# Patient Record
Sex: Female | Born: 1990 | Race: Black or African American | Hispanic: No | Marital: Single | Smoking: Never smoker
Health system: Southern US, Community
[De-identification: ages and names within clinical notes are randomized; demographics above are authoritative.]

## PROBLEM LIST (undated history)

## (undated) ENCOUNTER — Inpatient Hospital Stay (HOSPITAL_COMMUNITY): Payer: Self-pay

## (undated) DIAGNOSIS — D649 Anemia, unspecified: Secondary | ICD-10-CM

## (undated) DIAGNOSIS — N946 Dysmenorrhea, unspecified: Secondary | ICD-10-CM

## (undated) DIAGNOSIS — Z22322 Carrier or suspected carrier of Methicillin resistant Staphylococcus aureus: Secondary | ICD-10-CM

## (undated) DIAGNOSIS — F32A Depression, unspecified: Secondary | ICD-10-CM

## (undated) DIAGNOSIS — A609 Anogenital herpesviral infection, unspecified: Secondary | ICD-10-CM

## (undated) DIAGNOSIS — G43009 Migraine without aura, not intractable, without status migrainosus: Secondary | ICD-10-CM

## (undated) DIAGNOSIS — F419 Anxiety disorder, unspecified: Secondary | ICD-10-CM

## (undated) DIAGNOSIS — A64 Unspecified sexually transmitted disease: Secondary | ICD-10-CM

## (undated) DIAGNOSIS — A630 Anogenital (venereal) warts: Secondary | ICD-10-CM

## (undated) HISTORY — PX: WISDOM TOOTH EXTRACTION: SHX21

## (undated) HISTORY — DX: Migraine without aura, not intractable, without status migrainosus: G43.009

## (undated) HISTORY — DX: Anogenital herpesviral infection, unspecified: A60.9

## (undated) HISTORY — DX: Anxiety disorder, unspecified: F41.9

## (undated) HISTORY — DX: Dysmenorrhea, unspecified: N94.6

## (undated) HISTORY — DX: Unspecified sexually transmitted disease: A64

## (undated) HISTORY — DX: Anemia, unspecified: D64.9

## (undated) HISTORY — DX: Depression, unspecified: F32.A

## (undated) HISTORY — DX: Anogenital (venereal) warts: A63.0

## (undated) HISTORY — DX: Carrier or suspected carrier of methicillin resistant Staphylococcus aureus: Z22.322

## (undated) HISTORY — PX: MOUTH SURGERY: SHX715

## (undated) HISTORY — PX: OTHER SURGICAL HISTORY: SHX169

---
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
## 2010-02-10 NOTE — L&D Delivery Note (Signed)
Delivery Note At 12:52 AM a viable and healthy female Alisha Edwards) was delivered via Vaginal, Spontaneous Delivery (Presentation:Vertex ; Occiput Anterior).  APGAR: 8, 9; weight 7 lb 1.6 oz (3221 g).   Placenta status: Intact, Spontaneous Pathology.  Cord: 3 vessels with the following complications: None.  Anesthesia: Epidural  Episiotomy: None Lacerations: 1st degree;Perineal Suture Repair: 2.0 vicryl Est. Blood Loss (mL): 350   Mom to postpartum.   Baby A to nursery-stable.   Baby B to NA.  Veleka Djordjevic V 01/25/2011, 1:14 AM

## 2010-04-03 ENCOUNTER — Ambulatory Visit: Payer: Self-pay | Admitting: Women's Health

## 2010-04-05 ENCOUNTER — Ambulatory Visit (HOSPITAL_COMMUNITY): Payer: Self-pay

## 2010-04-21 ENCOUNTER — Inpatient Hospital Stay (INDEPENDENT_AMBULATORY_CARE_PROVIDER_SITE_OTHER)
Admission: RE | Admit: 2010-04-21 | Discharge: 2010-04-21 | Disposition: A | Discharge status: Home / Self Care | Payer: 59 | Source: Ambulatory Visit | Attending: Emergency Medicine | Admitting: Emergency Medicine

## 2010-04-21 ENCOUNTER — Ambulatory Visit (INDEPENDENT_AMBULATORY_CARE_PROVIDER_SITE_OTHER): Payer: 59

## 2010-04-21 DIAGNOSIS — S62639A Displaced fracture of distal phalanx of unspecified finger, initial encounter for closed fracture: Secondary | ICD-10-CM

## 2010-06-18 ENCOUNTER — Inpatient Hospital Stay (INDEPENDENT_AMBULATORY_CARE_PROVIDER_SITE_OTHER)
Admission: RE | Admit: 2010-06-18 | Discharge: 2010-06-18 | Disposition: A | Discharge status: Home / Self Care | Payer: 59 | Source: Ambulatory Visit | Attending: Family Medicine | Admitting: Family Medicine

## 2010-06-18 DIAGNOSIS — Z331 Pregnant state, incidental: Secondary | ICD-10-CM

## 2010-06-18 LAB — POCT URINALYSIS DIP (DEVICE)
Bilirubin Urine: NEGATIVE
Glucose, UA: NEGATIVE mg/dL
Hgb urine dipstick: NEGATIVE
Ketones, ur: NEGATIVE mg/dL
Nitrite: NEGATIVE
Protein, ur: NEGATIVE mg/dL
Specific Gravity, Urine: 1.015 (ref 1.005–1.030)
Urobilinogen, UA: 0.2 mg/dL (ref 0.0–1.0)
pH: 7.5 (ref 5.0–8.0)

## 2010-06-18 LAB — POCT PREGNANCY, URINE: Preg Test, Ur: POSITIVE

## 2010-06-19 ENCOUNTER — Emergency Department: Payer: Self-pay | Admitting: Emergency Medicine

## 2010-10-17 ENCOUNTER — Encounter: Payer: Self-pay | Admitting: Gynecology

## 2010-10-17 ENCOUNTER — Encounter: Payer: Self-pay | Admitting: *Deleted

## 2010-10-17 ENCOUNTER — Ambulatory Visit (INDEPENDENT_AMBULATORY_CARE_PROVIDER_SITE_OTHER): Payer: 59 | Admitting: Gynecology

## 2010-10-17 DIAGNOSIS — Z22322 Carrier or suspected carrier of Methicillin resistant Staphylococcus aureus: Secondary | ICD-10-CM | POA: Insufficient documentation

## 2010-10-17 DIAGNOSIS — N898 Other specified noninflammatory disorders of vagina: Secondary | ICD-10-CM

## 2010-10-17 DIAGNOSIS — A609 Anogenital herpesviral infection, unspecified: Secondary | ICD-10-CM | POA: Insufficient documentation

## 2010-10-17 DIAGNOSIS — B009 Herpesviral infection, unspecified: Secondary | ICD-10-CM | POA: Insufficient documentation

## 2010-10-17 NOTE — Progress Notes (Signed)
Patient presented complaining of a vaginal discharge and on questioning from the office staff was found to be 6 months pregnant and actively being followed by [facility]. She was asked to call their office and followup with them to be seen and she agreed to do so.

## 2011-01-24 ENCOUNTER — Encounter (HOSPITAL_COMMUNITY): Payer: Self-pay | Admitting: Anesthesiology

## 2011-01-24 ENCOUNTER — Encounter (HOSPITAL_COMMUNITY): Payer: Self-pay | Admitting: *Deleted

## 2011-01-24 ENCOUNTER — Inpatient Hospital Stay (HOSPITAL_COMMUNITY): Payer: 59 | Admitting: Anesthesiology

## 2011-01-24 ENCOUNTER — Inpatient Hospital Stay (HOSPITAL_COMMUNITY)
Admission: AD | Admit: 2011-01-24 | Discharge: 2011-01-27 | DRG: 775 | Disposition: A | Discharge status: Home / Self Care | Payer: 59 | Source: Ambulatory Visit | Attending: Obstetrics and Gynecology | Admitting: Obstetrics and Gynecology

## 2011-01-24 LAB — DIFFERENTIAL
Basophils Relative: 0 % (ref 0–1)
Eosinophils Absolute: 0 10*3/uL (ref 0.0–0.7)
Lymphs Abs: 1.3 10*3/uL (ref 0.7–4.0)
Monocytes Relative: 12 % (ref 3–12)
Neutro Abs: 8.5 10*3/uL — ABNORMAL HIGH (ref 1.7–7.7)
Neutrophils Relative %: 76 % (ref 43–77)

## 2011-01-24 LAB — RPR: RPR: NONREACTIVE

## 2011-01-24 LAB — CBC
Hemoglobin: 12.3 g/dL (ref 12.0–15.0)
MCH: 30.8 pg (ref 26.0–34.0)
Platelets: 175 10*3/uL (ref 150–400)
RBC: 4 MIL/uL (ref 3.87–5.11)
WBC: 11.1 10*3/uL — ABNORMAL HIGH (ref 4.0–10.5)

## 2011-01-24 LAB — ABO/RH: RH Type: POSITIVE

## 2011-01-24 LAB — RUBELLA ANTIBODY, IGM: Rubella: IMMUNE

## 2011-01-24 LAB — HEPATITIS B SURFACE ANTIGEN: Hepatitis B Surface Ag: NEGATIVE

## 2011-01-24 MED ORDER — LACTATED RINGERS IV SOLN
INTRAVENOUS | Status: DC
  Administered 2011-01-24 (×2): via INTRAVENOUS

## 2011-01-24 MED ORDER — FENTANYL 2.5 MCG/ML BUPIVACAINE 1/10 % EPIDURAL INFUSION (WH - ANES)
14.0000 mL/h | INTRAMUSCULAR | Status: DC
  Administered 2011-01-24 (×2): 14 mL/h via EPIDURAL
  Filled 2011-01-24 (×2): qty 60

## 2011-01-24 MED ORDER — BUTORPHANOL TARTRATE 2 MG/ML IJ SOLN: 1.0000 mg | INTRAMUSCULAR | Status: DC | PRN

## 2011-01-24 MED ORDER — LIDOCAINE HCL 1.5 % IJ SOLN
INTRAMUSCULAR | Status: DC | PRN
  Administered 2011-01-24 (×2): 5 mL via EPIDURAL

## 2011-01-24 MED ORDER — OXYTOCIN BOLUS FROM INFUSION
500.0000 mL | Freq: Once | INTRAVENOUS | Status: DC
  Filled 2011-01-24: qty 1000
  Filled 2011-01-24: qty 500

## 2011-01-24 MED ORDER — FENTANYL CITRATE 0.05 MG/ML IJ SOLN: 100.0000 ug | INTRAMUSCULAR | Status: DC | PRN

## 2011-01-24 MED ORDER — LACTATED RINGERS IV SOLN: 500.0000 mL | INTRAVENOUS | Status: DC | PRN

## 2011-01-24 MED ORDER — DIPHENHYDRAMINE HCL 50 MG/ML IJ SOLN: 12.5000 mg | INTRAMUSCULAR | Status: DC | PRN

## 2011-01-24 MED ORDER — PHENYLEPHRINE 40 MCG/ML (10ML) SYRINGE FOR IV PUSH (FOR BLOOD PRESSURE SUPPORT)
80.0000 ug | PREFILLED_SYRINGE | INTRAVENOUS | Status: DC | PRN
  Filled 2011-01-24: qty 5

## 2011-01-24 MED ORDER — LACTATED RINGERS IV BOLUS (SEPSIS)
1000.0000 mL | Freq: Once | INTRAVENOUS | Status: AC
  Administered 2011-01-24: 1000 mL via INTRAVENOUS

## 2011-01-24 MED ORDER — PHENYLEPHRINE 40 MCG/ML (10ML) SYRINGE FOR IV PUSH (FOR BLOOD PRESSURE SUPPORT): 80.0000 ug | PREFILLED_SYRINGE | INTRAVENOUS | Status: DC | PRN

## 2011-01-24 MED ORDER — IBUPROFEN 600 MG PO TABS
600.0000 mg | ORAL_TABLET | Freq: Four times a day (QID) | ORAL | Status: DC | PRN
  Administered 2011-01-25: 600 mg via ORAL
  Filled 2011-01-24: qty 1

## 2011-01-24 MED ORDER — LACTATED RINGERS IV SOLN: INTRAVENOUS | Status: DC

## 2011-01-24 MED ORDER — OXYCODONE-ACETAMINOPHEN 5-325 MG PO TABS: 2.0000 | ORAL_TABLET | ORAL | Status: DC | PRN

## 2011-01-24 MED ORDER — ACETAMINOPHEN 325 MG PO TABS: 650.0000 mg | ORAL_TABLET | ORAL | Status: DC | PRN

## 2011-01-24 MED ORDER — CITRIC ACID-SODIUM CITRATE 334-500 MG/5ML PO SOLN: 30.0000 mL | ORAL | Status: DC | PRN

## 2011-01-24 MED ORDER — LIDOCAINE HCL (PF) 1 % IJ SOLN
30.0000 mL | INTRAMUSCULAR | Status: DC | PRN
  Filled 2011-01-24: qty 30

## 2011-01-24 MED ORDER — ONDANSETRON HCL 4 MG/2ML IJ SOLN: 4.0000 mg | Freq: Four times a day (QID) | INTRAMUSCULAR | Status: DC | PRN

## 2011-01-24 MED ORDER — LACTATED RINGERS IV SOLN: 500.0000 mL | Freq: Once | INTRAVENOUS | Status: DC

## 2011-01-24 MED ORDER — FLEET ENEMA 7-19 GM/118ML RE ENEM: 1.0000 | ENEMA | RECTAL | Status: DC | PRN

## 2011-01-24 MED ORDER — EPHEDRINE 5 MG/ML INJ
10.0000 mg | INTRAVENOUS | Status: DC | PRN
  Filled 2011-01-24: qty 4

## 2011-01-24 MED ORDER — OXYTOCIN 20 UNITS IN LACTATED RINGERS INFUSION - SIMPLE
125.0000 mL/h | Freq: Once | INTRAVENOUS | Status: AC
  Administered 2011-01-25: 999 mL/h via INTRAVENOUS

## 2011-01-24 MED ORDER — EPHEDRINE 5 MG/ML INJ: 10.0000 mg | INTRAVENOUS | Status: DC | PRN

## 2011-01-24 NOTE — Anesthesia Preprocedure Evaluation (Signed)

## 2011-01-24 NOTE — H&P (Signed)
Alisha Edwards is a 20 y.o. female, G1P0 at 86 4/7 weeks, presenting in labor--cervix was 3 cm, 90%, vtx 0station at office, now 4-5, 100% after brief evaluation in MAU.  Denies leaking or bleeding, reports +FM.  Pregnancy remarkable for: Hx HSV--confidential, no recent or current lesions Hx MRSA 2003 1st trimester bleeding Migraines Slightly late to care at 17 weeks, but had some care in [facility] with Dr. Chilton Si  History of present pregnancy: Entered care at 17 weeks.  Had seen Dr. Chilton Si in [facility] for some care.  Had anatomy US at 20 weeks, with normal findings.  Declined flu vaccine.  No other issues.  No recent or current HSV outbreaks.  History OB History    Grav Para Term Preterm Abortions TAB SAB Ect Mult Living   1              Past Medical History  Diagnosis Date  . MRSA (methicillin resistant staph aureus) culture positive 2009    thigh  . HSV (herpes simplex virus) anogenital infection    Past Surgical History  Procedure Date  . Nasal surgery   . Mouth surgery    Family History: family history includes Breast cancer in her maternal grandmother and paternal grandmother and Hypertension in her maternal grandmother. Social History:  reports that she has never smoked. She does not have any smokeless tobacco history on file. She reports that she does not drink alcohol or use illicit drugs.  ROS:  Contractions, vaginal spotting.  Denies any other symptoms.    Blood pressure 116/70, pulse 89, temperature 99.5 F (37.5 C), temperature source Oral, resp. rate 20, height 5' 5.75" (1.67 m), weight 69.627 kg (153 lb 8 oz), last menstrual period 04/30/2010. Maternal Exam:  Uterine Assessment: Contraction strength is moderate.  Contraction frequency is regular.   Abdomen: Patient reports no abdominal tenderness. Fundal height is 37-38.   Estimated fetal weight is 7 obs.   Fetal presentation: vertex  Introitus: Normal vulva. Vulval lesions: no HSV lesions or prodromal sx.  Normal vagina.    Physical Exam  Genitourinary: Vulval lesions: no HSV lesions or prodromal sx.  Chest clear Heart RRR without murmur Abd gravid, NT, UCs q 3-4 minutes, moderate Cervix per RN--4+, 90 %. Vtx, 0 station, BBOW. Ext WNL, no edema FHR non-reactive, no decels, baseline 160.  Prenatal labs: ABO, Rh:  A+ Antibody:  Neg Rubella:  Immune RPR:   NR HBsAg:   Neg HIV:   Neg GBS:   Neg Sickle cell negative Cultures negative at NOB and at 36 weeks Glucola WNL Hgb at NOB 11.8/12 at 28 weeks  Assessment/Plan: IUP at 37 4/7 weeks Active labor GBS negative Hx HSV, no recent or current lesions Hx MRSA in past, on precautions Non-reactive FHR, with baseline 160  Plan: Admit to Birthing Suite per consult with Dr. Stefano Gaul IV hydration Plans epidural Close observation of FHR status. Anticipate  Alisha Edwards 01/24/2011, 6:20 PM

## 2011-01-24 NOTE — Progress Notes (Signed)
Patient C/O contraction pain. BP 126/76  Pulse 98  Temp(Src) 98.7 F (37.1 C) (Axillary)  Resp 20  Ht 5' 5.75" (1.67 m)  Wt 69.627 kg (153 lb 8 oz)  BMI 24.96 kg/m2  SpO2 80%  LMP 04/30/2010 NST: Accels noted. Variability 2-4 beats per minute. Will get epidural. Watch FHT.

## 2011-01-24 NOTE — Progress Notes (Signed)
Pt states, " I started contracting at 12:30 pm and they are five minutes apart and stronger. I was 3/80 in the office at 3:15 pm."

## 2011-01-24 NOTE — Anesthesia Procedure Notes (Signed)
Epidural Patient location during procedure: OB Start time: 01/24/2011 7:20 PM  Staffing Performed by: anesthesiologist   Preanesthetic Checklist Completed: patient identified, site marked, surgical consent, pre-op evaluation, timeout performed, IV checked, risks and benefits discussed and monitors and equipment checked  Epidural Patient position: sitting Prep: site prepped and draped and DuraPrep Patient monitoring: continuous pulse ox and blood pressure Approach: midline Injection technique: LOR air and LOR saline  Needle:  Needle type: Tuohy  Needle gauge: 17 G Needle length: 9 cm Needle insertion depth: 4 cm Catheter type: closed end flexible Catheter size: 19 Gauge Catheter at skin depth: 9 cm Test dose: negative  Assessment Events: blood not aspirated, injection not painful, no injection resistance, negative IV test and no paresthesia  Additional Notes Patient identified.  Risk benefits discussed including failed block, incomplete pain control, headache, nerve damage, paralysis, blood pressure changes, nausea, vomiting, reactions to medication both toxic or allergic, and postpartum back pain.  Patient expressed understanding and wished to proceed.  All questions were answered.  Sterile technique used throughout procedure and epidural site dressed with sterile barrier dressing. No paresthesia or other complications noted.The patient did not experience any signs of intravascular injection such as tinnitus or metallic taste in mouth nor signs of intrathecal spread such as rapid motor block. Please see nursing notes for vital signs.

## 2011-01-24 NOTE — Progress Notes (Signed)
Was 3cm in office, ctx's stronger, more regular now, denies bleeding or lof.

## 2011-01-24 NOTE — Progress Notes (Signed)
Patient ID: SUSY PLACZEK, female   DOB: 28-Apr-1990, 20 y.o.   MRN: [id number]   .Subjective: Comfortable with epidural   Objective: BP 126/77  Pulse 91  Temp(Src) 99.6 F (37.6 C) (Oral)  Resp 18  Ht 5' 5.75" (1.67 m)  Wt 69.627 kg (153 lb 8 oz)  BMI 24.96 kg/m2  SpO2 95%  LMP 04/30/2010   FHT:  FHR: 150 bpm, variability: minimal ,  accelerations:  Abscent,  decelerations:  Absent UC:   regular, every 2-3 minutes SVE:   Dilation: 8.5 Effacement (%): 100 Station: 0 Exam by:: s. Henri Guedes, CNM    Assessment / Plan: Spontaneous labor, progressing normally AROM clear fluid FSE placed, minimal response to scalp stim IUPC placed  Will do amnioinfusion   Fetal Wellbeing:  Category II Pain Control:  Epidural  D/W Dr Stefano Gaul Will CTO closely  Malissa Hippo 01/24/2011, 9:44 PM

## 2011-01-25 ENCOUNTER — Other Ambulatory Visit: Payer: Self-pay | Admitting: Obstetrics and Gynecology

## 2011-01-25 LAB — RPR: RPR Ser Ql: NONREACTIVE

## 2011-01-25 LAB — CBC
HCT: 29.2 % — ABNORMAL LOW (ref 36.0–46.0)
Hemoglobin: 10 g/dL — ABNORMAL LOW (ref 12.0–15.0)
MCH: 31.1 pg (ref 26.0–34.0)
MCHC: 34.2 g/dL (ref 30.0–36.0)
RBC: 3.22 MIL/uL — ABNORMAL LOW (ref 3.87–5.11)

## 2011-01-25 MED ORDER — BENZOCAINE-MENTHOL 20-0.5 % EX AERO
INHALATION_SPRAY | CUTANEOUS | Status: AC
  Administered 2011-01-25: 1 via TOPICAL
  Filled 2011-01-25: qty 56

## 2011-01-25 MED ORDER — LANOLIN HYDROUS EX OINT: TOPICAL_OINTMENT | CUTANEOUS | Status: DC | PRN

## 2011-01-25 MED ORDER — SENNOSIDES-DOCUSATE SODIUM 8.6-50 MG PO TABS
2.0000 | ORAL_TABLET | Freq: Every day | ORAL | Status: DC
  Administered 2011-01-25: 2 via ORAL

## 2011-01-25 MED ORDER — DIPHENHYDRAMINE HCL 25 MG PO CAPS: 25.0000 mg | ORAL_CAPSULE | Freq: Four times a day (QID) | ORAL | Status: DC | PRN

## 2011-01-25 MED ORDER — TETANUS-DIPHTH-ACELL PERTUSSIS 5-2.5-18.5 LF-MCG/0.5 IM SUSP: 0.5000 mL | Freq: Once | INTRAMUSCULAR | Status: DC

## 2011-01-25 MED ORDER — MEDROXYPROGESTERONE ACETATE 150 MG/ML IM SUSP: 150.0000 mg | INTRAMUSCULAR | Status: DC | PRN

## 2011-01-25 MED ORDER — ZOLPIDEM TARTRATE 5 MG PO TABS: 5.0000 mg | ORAL_TABLET | Freq: Every evening | ORAL | Status: DC | PRN

## 2011-01-25 MED ORDER — WITCH HAZEL-GLYCERIN EX PADS: 1.0000 "application " | MEDICATED_PAD | CUTANEOUS | Status: DC | PRN

## 2011-01-25 MED ORDER — IBUPROFEN 600 MG PO TABS
600.0000 mg | ORAL_TABLET | Freq: Four times a day (QID) | ORAL | Status: DC
  Administered 2011-01-25 – 2011-01-27 (×9): 600 mg via ORAL
  Filled 2011-01-25 (×9): qty 1

## 2011-01-25 MED ORDER — BENZOCAINE-MENTHOL 20-0.5 % EX AERO
1.0000 "application " | INHALATION_SPRAY | CUTANEOUS | Status: DC | PRN
  Administered 2011-01-25: 1 via TOPICAL

## 2011-01-25 MED ORDER — DIBUCAINE 1 % RE OINT: 1.0000 "application " | TOPICAL_OINTMENT | RECTAL | Status: DC | PRN

## 2011-01-25 MED ORDER — MEASLES, MUMPS & RUBELLA VAC ~~LOC~~ INJ
0.5000 mL | INJECTION | Freq: Once | SUBCUTANEOUS | Status: DC
  Filled 2011-01-25: qty 0.5

## 2011-01-25 MED ORDER — SIMETHICONE 80 MG PO CHEW: 80.0000 mg | CHEWABLE_TABLET | ORAL | Status: DC | PRN

## 2011-01-25 MED ORDER — ONDANSETRON HCL 4 MG PO TABS: 4.0000 mg | ORAL_TABLET | ORAL | Status: DC | PRN

## 2011-01-25 MED ORDER — OXYCODONE-ACETAMINOPHEN 5-325 MG PO TABS
1.0000 | ORAL_TABLET | ORAL | Status: DC | PRN
  Administered 2011-01-27 (×2): 1 via ORAL
  Filled 2011-01-25 (×2): qty 1

## 2011-01-25 MED ORDER — ONDANSETRON HCL 4 MG/2ML IJ SOLN: 4.0000 mg | INTRAMUSCULAR | Status: DC | PRN

## 2011-01-25 MED ORDER — PRENATAL PLUS 27-1 MG PO TABS
1.0000 | ORAL_TABLET | Freq: Every day | ORAL | Status: DC
  Administered 2011-01-25 – 2011-01-27 (×3): 1 via ORAL
  Filled 2011-01-25 (×3): qty 1

## 2011-01-25 NOTE — Anesthesia Postprocedure Evaluation (Signed)
Anesthesia Post Note  Patient: Alisha Edwards  Procedure(s) Performed: * No procedures listed *  Anesthesia type: Epidural  Patient location: Mother/Baby  Post pain: Pain level controlled  Post assessment: Post-op Vital signs reviewed  Last Vitals:  Filed Vitals:   01/25/11 0822  BP: 103/59  Pulse: 86  Temp: 36.8 C  Resp: 18    Post vital signs: Reviewed  Level of consciousness: awake  Complications: No apparent anesthesia complications

## 2011-01-26 NOTE — Progress Notes (Signed)
Patient ID: Alisha Edwards, female   DOB: 1990/09/29, 20 y.o.   MRN: [id number] Post Partum Day 1 Subjective: no complaints, up ad lib without syncope, voiding, tolerating PO, + flatus  Pain well controlled with po meds Bottle feeding Mood stable, bonding well   Objective: Blood pressure 95/58, pulse 81, temperature 98.1 F (36.7 C), temperature source Oral, resp. rate 18, height 5' 5.75" (1.67 m), weight 69.627 kg (153 lb 8 oz), last menstrual period 04/30/2010, SpO2 97.00%.  Physical Exam:  General: alert and no distress Lungs: CTAB Heart: RRR Breasts: soft Lochia: appropriate Uterine Fundus: firm Perineum: wnl DVT Evaluation: No evidence of DVT seen on physical exam. Negative Homan's sign. No significant calf/ankle edema.   Basename 01/25/11 0520 01/24/11 1733  HGB 10.0* 12.3  HCT 29.2* 37.1    Assessment/Plan: Plan for discharge tomorrow and Contraception discussed options including r/b/s, desires LoEstrin Stable PP Continue current POC      LOS: 2 days   Kedarius Aloisi M 01/26/2011, 11:04 AM

## 2011-01-27 ENCOUNTER — Encounter (HOSPITAL_COMMUNITY): Payer: Self-pay | Admitting: Obstetrics and Gynecology

## 2011-01-27 MED ORDER — BENZOCAINE-MENTHOL 20-0.5 % EX AERO
INHALATION_SPRAY | CUTANEOUS | Status: AC
  Administered 2011-01-27: 14:00:00
  Filled 2011-01-27: qty 56

## 2011-01-27 MED ORDER — NORGESTIMATE-ETH ESTRADIOL 0.25-35 MG-MCG PO TABS: 1.0000 | ORAL_TABLET | Freq: Every day | ORAL | Status: DC

## 2011-01-27 MED ORDER — IBUPROFEN 600 MG PO TABS: 600.0000 mg | ORAL_TABLET | Freq: Four times a day (QID) | ORAL | Status: AC

## 2011-01-27 MED ORDER — OXYCODONE-ACETAMINOPHEN 5-325 MG PO TABS: 1.0000 | ORAL_TABLET | ORAL | Status: AC | PRN

## 2011-01-27 NOTE — Discharge Summary (Signed)
Obstetric Discharge Summary Reason for Admission: onset of labor Prenatal Procedures: ultrasound Intrapartum Procedures: spontaneous vaginal delivery Postpartum Procedures: none Complications-Operative and Postpartum: 1st degree perineal laceration Hemoglobin  Date Value Range Status  01/25/2011 10.0* 12.0-15.0 (g/dL) Final     DELTA CHECK NOTED     REPEATED TO VERIFY     HCT  Date Value Range Status  01/25/2011 29.2* 36.0-46.0 (%) Final   Hospital Course: Admitted 01/24/11. Postive GBS. Had amnioinfusion for some decelerations, epidural placed for comfort. Delivery was performed by Dr. Stefano Gaul without complication. Patient and baby tolerated the procedure without difficulty, with  1st degree laceration noted. Infant to FTN. Mother and infant then had an uncomplicated postpartum course, with bottle feeding going well. Mom's physical exam was WNL, and she was discharged home in stable condition. Contraception plan was OCPs, rx'd with Sprintec.  She received adequate benefit from po pain medications, was using both Motrin and Percocet.   Discharge Diagnoses: Term Pregnancy-delivered  Discharge Information: Date: 01/27/2011 Activity: Per [facility] handout Diet: routine Medications: Ibuprofen and Percocet Condition: stable Instructions: refer to practice specific booklet Discharge to: home Follow-up Information    Follow up with [facility].         Newborn Data: Live born female  Birth Weight: 7 lb 1.6 oz (3221 g) APGAR: 8, 9  Home with mother.  Alisha Edwards 01/27/2011, 8:03 AM

## 2011-06-12 ENCOUNTER — Other Ambulatory Visit (HOSPITAL_COMMUNITY)
Admission: RE | Admit: 2011-06-12 | Discharge: 2011-06-12 | Disposition: A | Discharge status: Home / Self Care | Payer: 59 | Source: Ambulatory Visit | Attending: Gynecology | Admitting: Gynecology

## 2011-06-12 ENCOUNTER — Ambulatory Visit (INDEPENDENT_AMBULATORY_CARE_PROVIDER_SITE_OTHER): Payer: 59 | Admitting: Gynecology

## 2011-06-12 ENCOUNTER — Encounter: Payer: Self-pay | Admitting: Gynecology

## 2011-06-12 VITALS — BP 100/60 | Ht 66.5 in | Wt 132.0 lb

## 2011-06-12 DIAGNOSIS — Z01419 Encounter for gynecological examination (general) (routine) without abnormal findings: Secondary | ICD-10-CM

## 2011-06-12 DIAGNOSIS — N898 Other specified noninflammatory disorders of vagina: Secondary | ICD-10-CM

## 2011-06-12 LAB — WET PREP FOR TRICH, YEAST, CLUE
Clue Cells Wet Prep HPF POC: NONE SEEN
Yeast Wet Prep HPF POC: NONE SEEN

## 2011-06-12 MED ORDER — METRONIDAZOLE 500 MG PO TABS: 500.0000 mg | ORAL_TABLET | Freq: Two times a day (BID) | ORAL | Status: AC

## 2011-06-12 NOTE — Progress Notes (Signed)
Addended by: Richardson Chiquito on: 06/12/2011 11:40 AM   Modules accepted: Orders

## 2011-06-12 NOTE — Progress Notes (Signed)
Addended by: Richardson Chiquito on: 06/12/2011 11:26 AM   Modules accepted: Orders

## 2011-06-12 NOTE — Patient Instructions (Signed)
Start on oral contraceptives as we discussed. Follow up in one year for annual exam.

## 2011-06-12 NOTE — Progress Notes (Signed)
Alisha Edwards 1990/07/06 [id number]        21 y.o.  for annual exam.  Just delivered vaginally in December. Had a normal postpartum checkup.  Is bottlefeeding.  Several issues noted below.  Past medical history,surgical history, medications, allergies, family history and social history were all reviewed and documented in the EPIC chart. ROS:  Was performed and pertinent positives and negatives are included in the history.  Exam: Sherrilyn Rist chaperone present Filed Vitals:   06/12/11 1047  BP: 100/60   General appearance  Normal Skin grossly normal Head/Neck normal with no cervical or supraclavicular adenopathy thyroid normal Lungs  clear Cardiac RR, without RMG Abdominal  soft, nontender, without masses, organomegaly or hernia Breasts  examined lying and sitting without masses, retractions, discharge or axillary adenopathy. Pelvic  Ext/BUS/vagina  normal white discharge  Cervix  normal  Pap done  Uterus  anteverted, normal size, shape and contour, midline and mobile nontender   Adnexa  Without masses or tenderness    Anus and perineum  normal   Rectovaginal  normal sphincter tone without palpated masses or tenderness.    Assessment/Plan:  21 y.o. female for annual exam.    1. White discharge. Patient to use Monistat said that seemed a little better but still is present. Wet prep consistent with PVD. We'll treat with Flagyl 500 twice a day x7 days alcohol avoidance reviewed. 2. Contraception. Patient's had several menses since delivery which were heavy. She has a prescription for oral contraceptives from her postpartum visit and plan starting with her next menses. Assuming her menses lighten them we'll follow up as an issue she knows to represent for further evaluation. 3. Pap smear. I did a Pap smear today as she is turning 21 in 2 months. 4. Self breast exams on the basis discussed encouraged. She is bottlefeeding. 5. Health maintenance. No lab work was done today given her recent  delivery. She'll see me in a year assuming she continues well, sooner as needed.    Dara Lords MD, 11:19 AM 06/12/2011

## 2011-07-02 ENCOUNTER — Encounter (HOSPITAL_COMMUNITY): Payer: Self-pay | Admitting: *Deleted

## 2011-07-02 ENCOUNTER — Emergency Department (HOSPITAL_COMMUNITY)
Admission: EM | Admit: 2011-07-02 | Discharge: 2011-07-02 | Disposition: A | Discharge status: Home / Self Care | Payer: 59 | Source: Home / Self Care | Attending: Emergency Medicine | Admitting: Emergency Medicine

## 2011-07-02 DIAGNOSIS — J329 Chronic sinusitis, unspecified: Secondary | ICD-10-CM

## 2011-07-02 DIAGNOSIS — N760 Acute vaginitis: Secondary | ICD-10-CM

## 2011-07-02 LAB — POCT URINALYSIS DIP (DEVICE)
Nitrite: NEGATIVE
Protein, ur: NEGATIVE mg/dL
Urobilinogen, UA: 1 mg/dL (ref 0.0–1.0)
pH: 7.5 (ref 5.0–8.0)

## 2011-07-02 LAB — WET PREP, GENITAL: Clue Cells Wet Prep HPF POC: NONE SEEN

## 2011-07-02 MED ORDER — AMOXICILLIN 500 MG PO CAPS: 500.0000 mg | ORAL_CAPSULE | Freq: Three times a day (TID) | ORAL | Status: AC

## 2011-07-02 MED ORDER — METRONIDAZOLE 500 MG PO TABS: 500.0000 mg | ORAL_TABLET | Freq: Two times a day (BID) | ORAL | Status: AC

## 2011-07-02 NOTE — ED Notes (Signed)
Pt with c/o cough/congestion x 2 weeks pt also with vaginal irritation soreness onset yesterday - pt with history bacterial infection completed antibiotics

## 2011-07-02 NOTE — ED Provider Notes (Signed)
Medical screening examination/treatment/procedure(s) were performed by non-physician practitioner and as supervising physician I was immediately available for consultation/collaboration.  Cylan Borum   Sanela Evola, MD 07/02/11 1251 

## 2011-07-02 NOTE — Discharge Instructions (Signed)
Bacterial Vaginosis Bacterial vaginosis (BV) is a vaginal infection where the normal balance of bacteria in the vagina is disrupted. The normal balance is then replaced by an overgrowth of certain bacteria. There are several different kinds of bacteria that can cause BV. BV is the most common vaginal infection in women of childbearing age. CAUSES   The cause of BV is not fully understood. BV develops when there is an increase or imbalance of harmful bacteria.   Some activities or behaviors can upset the normal balance of bacteria in the vagina and put women at increased risk including:   Having a new sex partner or multiple sex partners.   Douching.   Using an intrauterine device (IUD) for contraception.   It is not clear what role sexual activity plays in the development of BV. However, women that have never had sexual intercourse are rarely infected with BV.  Women do not get BV from toilet seats, bedding, swimming pools or from touching objects around them.  SYMPTOMS   Grey vaginal discharge.   A fish-like odor with discharge, especially after sexual intercourse.   Itching or burning of the vagina and vulva.   Burning or pain with urination.   Some women have no signs or symptoms at all.  DIAGNOSIS  Your caregiver must examine the vagina for signs of BV. Your caregiver will perform lab tests and look at the sample of vaginal fluid through a microscope. They will look for bacteria and abnormal cells (clue cells), a pH test higher than 4.5, and a positive amine test all associated with BV.  RISKS AND COMPLICATIONS   Pelvic inflammatory disease (PID).   Infections following gynecology surgery.   Developing HIV.   Developing herpes virus.  TREATMENT  Sometimes BV will clear up without treatment. However, all women with symptoms of BV should be treated to avoid complications, especially if gynecology surgery is planned. Female partners generally do not need to be treated. However,  BV may spread between female sex partners so treatment is helpful in preventing a recurrence of BV.   BV may be treated with antibiotics. The antibiotics come in either pill or vaginal cream forms. Either can be used with nonpregnant or pregnant women, but the recommended dosages differ. These antibiotics are not harmful to the baby.   BV can recur after treatment. If this happens, a second round of antibiotics will often be prescribed.   Treatment is important for pregnant women. If not treated, BV can cause a premature delivery, especially for a pregnant woman who had a premature birth in the past. All pregnant women who have symptoms of BV should be checked and treated.   For chronic reoccurrence of BV, treatment with a type of prescribed gel vaginally twice a week is helpful.  HOME CARE INSTRUCTIONS   Finish all medication as directed by your caregiver.   Do not have sex until treatment is completed.   Tell your sexual partner that you have a vaginal infection. They should see their caregiver and be treated if they have problems, such as a mild rash or itching.   Practice safe sex. Use condoms. Only have 1 sex partner.  PREVENTION  Basic prevention steps can help reduce the risk of upsetting the natural balance of bacteria in the vagina and developing BV:  Do not have sexual intercourse (be abstinent).   Do not douche.   Use all of the medicine prescribed for treatment of BV, even if the signs and symptoms go away.     Tell your sex partner if you have BV. That way, they can be treated, if needed, to prevent reoccurrence.  SEEK MEDICAL CARE IF:   Your symptoms are not improving after 3 days of treatment.   You have increased discharge, pain, or fever.  MAKE SURE YOU:   Understand these instructions.   Will watch your condition.   Will get help right away if you are not doing well or get worse.  FOR MORE INFORMATION  Division of STD Prevention (DSTDP), Centers for Disease  Control and Prevention: SolutionApps.co.za American Social Health Association (ASHA): www.ashastd.org  Document Released: 01/27/2005 Document Revised: 01/16/2011 Document Reviewed: 07/20/2008 [facility] Patient Information 2012 [place], [place].Sinusitis Sinuses are air pockets within the bones of your face. The growth of bacteria within a sinus leads to infection. The infection prevents the sinuses from draining. This infection is called sinusitis. SYMPTOMS  There will be different areas of pain depending on which sinuses have become infected.  The maxillary sinuses often produce pain beneath the eyes.   Frontal sinusitis may cause pain in the middle of the forehead and above the eyes.  Other problems (symptoms) include:  Toothaches.   Colored, pus-like (purulent) drainage from the nose.   Swelling, warmth, and tenderness over the sinus areas may be signs of infection.  TREATMENT  Sinusitis is most often determined by an exam.X-rays may be taken. If x-rays have been taken, make sure you obtain your results or find out how you are to obtain them. Your caregiver may give you medications (antibiotics). These are medications that will help kill the bacteria causing the infection. You may also be given a medication (decongestant) that helps to reduce sinus swelling.  HOME CARE INSTRUCTIONS   Only take over-the-counter or prescription medicines for pain, discomfort, or fever as directed by your caregiver.   Drink extra fluids. Fluids help thin the mucus so your sinuses can drain more easily.   Applying either moist heat or ice packs to the sinus areas may help relieve discomfort.   Use saline nasal sprays to help moisten your sinuses. The sprays can be found at your local drugstore.  SEEK IMMEDIATE MEDICAL CARE IF:  You have a fever.   You have increasing pain, severe headaches, or toothache.   You have nausea, vomiting, or drowsiness.   You develop unusual swelling around the face or  trouble seeing.  MAKE SURE YOU:   Understand these instructions.   Will watch your condition.   Will get help right away if you are not doing well or get worse.  Document Released: 01/27/2005 Document Revised: 01/16/2011 Document Reviewed: 08/26/2006 [facility] Patient Information 2012 [place], [place].

## 2011-07-02 NOTE — ED Provider Notes (Signed)
History     CSN: [id number]  Arrival date & time 07/02/11  1030   First MD Initiated Contact with Patient 07/02/11 1132      Chief Complaint  Patient presents with  . Cough  . Nasal Congestion  . Vaginal Itching    (Consider location/radiation/quality/duration/timing/severity/associated sxs/prior treatment) Patient is a 21 y.o. female presenting with cough, vaginal itching, and vaginal discharge. The history is provided by the patient.  Cough This is a new problem. The current episode started more than 1 week ago. The problem occurs constantly. The problem has been gradually worsening. The cough is non-productive. There has been no fever. Associated symptoms include rhinorrhea and shortness of breath. She has tried nothing for the symptoms. The treatment provided moderate relief. Her past medical history does not include bronchitis or pneumonia.  Vaginal Itching Associated symptoms include shortness of breath.  Vaginal Discharge This is a recurrent problem. The current episode started more than 1 week ago. The problem occurs constantly. Associated symptoms include shortness of breath. The symptoms are aggravated by nothing. The symptoms are relieved by nothing.  Pt was recently treated for BV.  Pt reports she has the same symptoms again  Past Medical History  Diagnosis Date  . MRSA (methicillin resistant staph aureus) culture positive 2009    thigh  . HSV (herpes simplex virus) anogenital infection   . Bacterial vaginosis     Past Surgical History  Procedure Date  . Nasal surgery   . Mouth surgery     Family History  Problem Relation Age of Onset  . Hypertension Maternal Grandmother   . Breast cancer Maternal Grandmother   . Breast cancer Paternal Grandmother     History  Substance Use Topics  . Smoking status: Never Smoker   . Smokeless tobacco: Never Used  . Alcohol Use: Yes     rarely    OB History    Grav Para Term Preterm Abortions TAB SAB Ect Mult Living     2 1 1       1       Review of Systems  HENT: Positive for rhinorrhea.   Respiratory: Positive for cough and shortness of breath.   Genitourinary: Positive for vaginal discharge.  All other systems reviewed and are negative.    Allergies  Shellfish allergy  Home Medications   Current Outpatient Rx  Name Route Sig Dispense Refill  . THERAFLU FLU/COLD PO Oral Take by mouth.    . GUAIFENESIN 100 MG/5ML PO LIQD Oral Take 200 mg by mouth 3 (three) times daily as needed.    . AMOXICILLIN 500 MG PO CAPS Oral Take 1 capsule (500 mg total) by mouth 3 (three) times daily. 30 capsule 0  . METRONIDAZOLE 500 MG PO TABS Oral Take 1 tablet (500 mg total) by mouth 2 (two) times daily. 14 tablet 0  . NORGESTIMATE-ETH ESTRADIOL 0.25-35 MG-MCG PO TABS Oral Take 1 tablet by mouth daily. 1 Package 11    BP 101/63  Pulse 80  Temp(Src) 99 F (37.2 C) (Oral)  Resp 14  SpO2 100%  LMP 05/31/2011  Physical Exam  Nursing note and vitals reviewed. Constitutional: She is oriented to person, place, and time. She appears well-developed and well-nourished.  HENT:  Head: Normocephalic and atraumatic.  Neck: Normal range of motion.  Cardiovascular: Normal rate and normal heart sounds.   Pulmonary/Chest: Effort normal.  Abdominal: Soft. Bowel sounds are normal.  Genitourinary: Vaginal discharge found.       Cervix nontender  Musculoskeletal: Normal range of motion.  Neurological: She is alert and oriented to person, place, and time. She has normal reflexes.    ED Course  Procedures (including critical care time)  Labs Reviewed  POCT URINALYSIS DIP (DEVICE) - Abnormal; Notable for the following:    Leukocytes, UA TRACE (*) Biochemical Testing Only. Please order routine urinalysis from main lab if confirmatory testing is needed.   All other components within normal limits  POCT PREGNANCY, URINE  WET PREP, GENITAL  GC/CHLAMYDIA PROBE AMP, GENITAL   No results found.   1. Sinusitis   2.  Vaginitis       MDM          Elson Areas, PA 07/02/11 1239

## 2011-07-03 LAB — GC/CHLAMYDIA PROBE AMP, GENITAL: Chlamydia, DNA Probe: NEGATIVE

## 2011-07-04 ENCOUNTER — Telehealth (HOSPITAL_COMMUNITY): Payer: Self-pay | Admitting: *Deleted

## 2011-07-04 NOTE — ED Notes (Signed)
GC/Chlamydia neg., Wet prep: Rare yeast, many WBC's.  Pt. complained of vaginal discharge and itching.  Lab shown to Dr. Ladon Applebaum and order obtained for Diflucan 150 mg. po X 1. No refills.  I called and left a message for pt. to call. Vassie Moselle 07/04/2011

## 2011-07-04 NOTE — ED Notes (Signed)
Pt. called back on VM. I called pt. back.  Pt. verified x 2 and given results. I told her she did not have to finish the Flagyl unless she wanted to, but she needs 1 pill of Diflucan. Pt. got angry and said so I have to get another Rx? I told her yes, the lab did not come back until today and I got the order for it today.  She said to send it to [facility] on [address].  Rx. called to pharmacist @ [phone]. Vassie Moselle 07/04/2011

## 2011-07-05 MED ORDER — FLUCONAZOLE 150 MG PO TABS: 200.0000 mg | ORAL_TABLET | Freq: Once | ORAL | Status: AC

## 2011-11-03 ENCOUNTER — Encounter: Payer: Self-pay | Admitting: Gynecology

## 2011-11-03 ENCOUNTER — Ambulatory Visit (INDEPENDENT_AMBULATORY_CARE_PROVIDER_SITE_OTHER): Payer: 59 | Admitting: Gynecology

## 2011-11-03 DIAGNOSIS — N949 Unspecified condition associated with female genital organs and menstrual cycle: Secondary | ICD-10-CM

## 2011-11-03 DIAGNOSIS — R102 Pelvic and perineal pain: Secondary | ICD-10-CM

## 2011-11-03 DIAGNOSIS — N938 Other specified abnormal uterine and vaginal bleeding: Secondary | ICD-10-CM

## 2011-11-03 NOTE — Patient Instructions (Signed)
Intrauterine Device Insertion Most often, an intrauterine device (IUD) is inserted into the uterus to prevent pregnancy. There are 2 types of IUDs available:  Copper IUD. This type of IUD creates an environment that is not favorable to sperm survival. The mechanism of action of the copper IUD is not known for certain. It can stay in place for 10 years.   Hormone IUD. This type of IUD contains the hormone progestin (synthetic progesterone). The progestin thickens the cervical mucus and prevents sperm from entering the uterus, and it also thins the uterine lining. There is no evidence that the hormone IUD prevents implantation. The hormone IUD can stay in place for up to 5 years.  An IUD is the most cost-effective birth control if left in place for the full duration. It may be removed at any time. LET YOUR CAREGIVER KNOW ABOUT:  Sensitivity to metals.   Medicines taken including herbs, eyedrops, over-the-counter medicines, and creams.   Use of steroids (by mouth or creams).   Previous problems with anesthetics or numbing medicine.   Previous gynecological surgery.   History of blood clots or clotting disorders.   Possibility of pregnancy.   Menstrual irregularities.   Concerns regarding unusual vaginal discharge or odors.   Previous experience with an IUD.   Other health problems.  RISKS AND COMPLICATIONS  Accidental puncture (perforation) of the uterus.   Accidental placement of the IUD either in the muscle layer of the uterus (myometrium) or outside the uterus. If this happen, the IUD can be found essentially floating around the bowels. When this happens, the IUD must be taken out surgically.   The IUD may fall out of the uterus (expulsion). This is more common in women who have recently had a child.    Pregnancy in the fallopian tube (ectopic).  BEFORE THE PROCEDURE  Schedule the IUD insertion for when you will have your menstrual period or right after, to make sure you  are not pregnant. Placement of the IUD is better tolerated shortly after a menstrual cycle.   You may need to take tests or be examined to make sure you are not pregnant.   You may be required to take a pregnancy test.   You may be required to get checked for sexually transmitted infections (STIs) prior to placement. Placing an IUD in someone who has an infection can make an infection worse.   You may be given a pain reliever to take 1 or 2 hours before the procedure.   An exam will be performed to determine the size and position of your uterus.   Ask your caregiver about changing or stopping your regular medicines.  PROCEDURE   A tool (speculum) is placed in the vagina. This allows your caregiver to see the lower part of the uterus (cervix).   The cervix is prepped with a medicine that lowers the risk of infection.   You may be given a medicine to numb each side of the cervix (intracervical or paracervical block). This is used to block and control any discomfort with insertion.   A tool (uterine sound) is inserted into the uterus to determine the length of the uterine cavity and the direction the uterus may be tilted.   A slim instrument (IUD inserter) is inserted through the cervical canal and into your uterus.   The IUD is placed in the uterine cavity and the insertion device is removed.   The nylon string that is attached to the IUD, and used for   eventual IUD removal, is trimmed. It is trimmed so that it lays high in the vagina, just outside the cervix.  AFTER THE PROCEDURE  You may have bleeding after the procedure. This is normal. It varies from light spotting for a few days to menstrual-like bleeding.   You may have mild cramping.   Practice checking the string coming out of the cervix to make sure the IUD remains in the uterus. If you cannot feel the string, you should schedule a "string check" with your caregiver.   If you had a hormone IUD inserted, expect that your  period may be lighter or nonexistent within a year's time (though this is not always the case). There may be delayed fertility with the hormone IUD as a result of its progesterone effect. When you are ready to become pregnant, it is suggested to have the IUD removed up to 1 year in advance.   Yearly exams are advised.  Document Released: 09/25/2010 Document Revised: 01/16/2011 Document Reviewed: 09/25/2010 Exit[employer] Patient Information 2012 [employer]. 

## 2011-11-03 NOTE — Progress Notes (Signed)
Patient presents with normal monthly menses LMP 10/18/2011. Started spotting 2 days ago with cramping. Checked a home pregnancy test which was negative. No history of this before. No fever chills nausea vomiting diarrhea constipation or urinary symptoms.  Exam with Kim Asst. Abdomen soft nontender without masses guarding rebound organomegaly. Pelvic external BUS vagina with dark brown staining. Cervix normal. Uterus anteverted normal size midline mobile nontender. Adnexa without masses or tenderness.  Assessment and plan: 1.  Episode of breakthrough bleeding with cramping. Exam is normal. No history of irregular bleeding or cramping previously.  Check serum hCG and GC Chlamydia screen of the cervix. Motrin 800 mg 3 times a day for now. Possible ultrasound if cramping persists or becomes recurrent. 2. Contraceptive options. I reviewed contraceptive options with her. She is using condoms inconsistently. Pill, patch, ring, IUD, Nexplanon reviewed. Patient is interested in Mirena IUD.  I gave her a booklet on this. I reviewed the insertional process and the risks/benefits. If patient desires we'll schedule with her next menses. Again if pain or irregular bleeding continues she'll call and we'll start a further evaluation with ultrasound.

## 2011-11-04 ENCOUNTER — Telehealth: Payer: Self-pay | Admitting: Gynecology

## 2011-11-04 LAB — GC/CHLAMYDIA PROBE AMP, GENITAL
Chlamydia, DNA Probe: NEGATIVE
GC Probe Amp, Genital: NEGATIVE

## 2011-11-04 LAB — HCG, SERUM, QUALITATIVE: Preg, Serum: NEGATIVE

## 2011-11-04 NOTE — Telephone Encounter (Signed)
MIRENA BENEFITS: Spoke w/pt today and told her that her [facility] insurance covers the Mirena & insertion at 100%,no copay. She will call us the  First day of her next cycle to schedule an appt with TF/WL

## 2011-11-06 ENCOUNTER — Other Ambulatory Visit: Payer: Self-pay | Admitting: Gynecology

## 2011-11-06 DIAGNOSIS — Z3049 Encounter for surveillance of other contraceptives: Secondary | ICD-10-CM

## 2011-11-06 MED ORDER — LEVONORGESTREL 20 MCG/24HR IU IUD: INTRAUTERINE_SYSTEM | Freq: Once | INTRAUTERINE | Status: DC

## 2011-11-24 ENCOUNTER — Other Ambulatory Visit: Payer: Self-pay | Admitting: Gynecology

## 2011-11-24 DIAGNOSIS — Z3049 Encounter for surveillance of other contraceptives: Secondary | ICD-10-CM

## 2011-11-24 MED ORDER — LEVONORGESTREL 20 MCG/24HR IU IUD: INTRAUTERINE_SYSTEM | Freq: Once | INTRAUTERINE | Status: DC

## 2011-11-26 ENCOUNTER — Ambulatory Visit: Payer: 59 | Admitting: Gynecology

## 2011-11-28 ENCOUNTER — Encounter: Payer: Self-pay | Admitting: Gynecology

## 2011-11-28 ENCOUNTER — Ambulatory Visit (INDEPENDENT_AMBULATORY_CARE_PROVIDER_SITE_OTHER): Payer: 59 | Admitting: Gynecology

## 2011-11-28 ENCOUNTER — Other Ambulatory Visit: Payer: Self-pay | Admitting: Gynecology

## 2011-11-28 DIAGNOSIS — Z30431 Encounter for routine checking of intrauterine contraceptive device: Secondary | ICD-10-CM

## 2011-11-28 DIAGNOSIS — Z30430 Encounter for insertion of intrauterine contraceptive device: Secondary | ICD-10-CM

## 2011-11-28 MED ORDER — LEVONORGESTREL 20 MCG/24HR IU IUD: INTRAUTERINE_SYSTEM | Freq: Once | INTRAUTERINE | Status: DC

## 2011-11-28 NOTE — Patient Instructions (Signed)
Intrauterine Device Insertion Most often, an intrauterine device (IUD) is inserted into the uterus to prevent pregnancy. There are 2 types of IUDs available:  Copper IUD. This type of IUD creates an environment that is not favorable to sperm survival. The mechanism of action of the copper IUD is not known for certain. It can stay in place for 10 years.  Hormone IUD. This type of IUD contains the hormone progestin (synthetic progesterone). The progestin thickens the cervical mucus and prevents sperm from entering the uterus, and it also thins the uterine lining. There is no evidence that the hormone IUD prevents implantation. The hormone IUD can stay in place for up to 5 years. An IUD is the most cost-effective birth control if left in place for the full duration. It may be removed at any time. LET YOUR CAREGIVER KNOW ABOUT:  Sensitivity to metals.  Medicines taken including herbs, eyedrops, over-the-counter medicines, and creams.  Use of steroids (by mouth or creams).  Previous problems with anesthetics or numbing medicine.  Previous gynecological surgery.  History of blood clots or clotting disorders.  Possibility of pregnancy.  Menstrual irregularities.  Concerns regarding unusual vaginal discharge or odors.  Previous experience with an IUD.  Other health problems. RISKS AND COMPLICATIONS  Accidental puncture (perforation) of the uterus.  Accidental placement of the IUD either in the muscle layer of the uterus (myometrium) or outside the uterus. If this happen, the IUD can be found essentially floating around the bowels. When this happens, the IUD must be taken out surgically.  The IUD may fall out of the uterus (expulsion). This is more common in women who have recently had a child.   Pregnancy in the fallopian tube (ectopic). BEFORE THE PROCEDURE  Schedule the IUD insertion for when you will have your menstrual period or right after, to make sure you are not pregnant.  Placement of the IUD is better tolerated shortly after a menstrual cycle.  You may need to take tests or be examined to make sure you are not pregnant.  You may be required to take a pregnancy test.  You may be required to get checked for sexually transmitted infections (STIs) prior to placement. Placing an IUD in someone who has an infection can make an infection worse.  You may be given a pain reliever to take 1 or 2 hours before the procedure.  An exam will be performed to determine the size and position of your uterus.  Ask your caregiver about changing or stopping your regular medicines. PROCEDURE   A tool (speculum) is placed in the vagina. This allows your caregiver to see the lower part of the uterus (cervix).  The cervix is prepped with a medicine that lowers the risk of infection.  You may be given a medicine to numb each side of the cervix (intracervical or paracervical block). This is used to block and control any discomfort with insertion.  A tool (uterine sound) is inserted into the uterus to determine the length of the uterine cavity and the direction the uterus may be tilted.  A slim instrument (IUD inserter) is inserted through the cervical canal and into your uterus.  The IUD is placed in the uterine cavity and the insertion device is removed.  The nylon string that is attached to the IUD, and used for eventual IUD removal, is trimmed. It is trimmed so that it lays high in the vagina, just outside the cervix. AFTER THE PROCEDURE  You may have bleeding after the  attached to the IUD, and used for eventual IUD removal, is trimmed. It is trimmed so that it lays high in the vagina, just outside the cervix.  AFTER THE PROCEDURE  · You may have bleeding after the procedure. This is normal. It varies from light spotting for a few days to menstrual-like bleeding.   · You may have mild cramping.  · Practice checking the string coming out of the cervix to make sure the IUD remains in the uterus. If you cannot feel the string, you should schedule a "string check" with your caregiver.  · If you had a hormone IUD inserted, expect that your period may be lighter or nonexistent  within a year's time (though this is not always the case). There may be delayed fertility with the hormone IUD as a result of its progesterone effect. When you are ready to become pregnant, it is suggested to have the IUD removed up to 1 year in advance.  · Yearly exams are advised.  Document Released: 09/25/2010 Document Revised: 04/21/2011 Document Reviewed: 09/25/2010  Exit[employer]® Patient Information ©2013 [employer].

## 2011-11-28 NOTE — Progress Notes (Signed)
Patient presents for Mirena IUD placement. She has read through the booklet, has no contraindications and signed the consent form. She currently is on a normal menses.  I reviewed the insertional process with her as well as the risks to include infection either immediate or long-term, uterine perforation or migration requiring surgery to remove, other complications such as pain, hormonal side effects, infertility  and possibilities of failure with subsequent pregnancy.    Exam with Kim assistant Pelvic: External BUS vagina normal. Cervix normal light menses. Uterus anteverted normal size shape contour midline mobile nontender. Adnexa without masses or tenderness.  Procedure: The cervix was cleansed with Betadine, anterior lip grasped with a single-tooth tenaculum, the uterus was sounded and a Mirena IUD was placed according to manufacturer's recommendations without difficulty. The strings were trimmed. The patient tolerated well and will follow up in one month for a postinsertional check.  Lot number:[id number]

## 2011-12-26 ENCOUNTER — Ambulatory Visit: Payer: 59 | Admitting: Gynecology

## 2012-03-24 ENCOUNTER — Ambulatory Visit: Payer: 59 | Admitting: Gynecology

## 2012-03-27 ENCOUNTER — Other Ambulatory Visit: Payer: Self-pay

## 2012-04-01 ENCOUNTER — Encounter: Payer: Self-pay | Admitting: Gynecology

## 2012-04-01 ENCOUNTER — Ambulatory Visit (INDEPENDENT_AMBULATORY_CARE_PROVIDER_SITE_OTHER): Payer: 59 | Admitting: Gynecology

## 2012-04-01 DIAGNOSIS — Z30432 Encounter for removal of intrauterine contraceptive device: Secondary | ICD-10-CM

## 2012-04-01 DIAGNOSIS — N899 Noninflammatory disorder of vagina, unspecified: Secondary | ICD-10-CM

## 2012-04-01 DIAGNOSIS — Z30431 Encounter for routine checking of intrauterine contraceptive device: Secondary | ICD-10-CM

## 2012-04-01 DIAGNOSIS — N938 Other specified abnormal uterine and vaginal bleeding: Secondary | ICD-10-CM

## 2012-04-01 DIAGNOSIS — N949 Unspecified condition associated with female genital organs and menstrual cycle: Secondary | ICD-10-CM

## 2012-04-01 DIAGNOSIS — N898 Other specified noninflammatory disorders of vagina: Secondary | ICD-10-CM

## 2012-04-01 LAB — WET PREP FOR TRICH, YEAST, CLUE: Yeast Wet Prep HPF POC: NONE SEEN

## 2012-04-01 LAB — TSH: TSH: 1.187 u[IU]/mL (ref 0.350–4.500)

## 2012-04-01 MED ORDER — NORETHINDRONE ACET-ETHINYL EST 1-20 MG-MCG PO TABS: 1.0000 | ORAL_TABLET | Freq: Every day | ORAL | Status: DC

## 2012-04-01 MED ORDER — METRONIDAZOLE 500 MG PO TABS: 500.0000 mg | ORAL_TABLET | Freq: Two times a day (BID) | ORAL | Status: DC

## 2012-04-01 NOTE — Patient Instructions (Signed)
Start on oral contraceptives now. Follow up in May for your annual exam. Take Flagyl medication twice daily for 7 days. Avoid alcohol while taking.

## 2012-04-01 NOTE — Progress Notes (Signed)
Patient presents having had a Mirena IUD placed in October.  Since then there is a lot of dry skin irritation and breakthrough bleeding particularly since December. She is tired of this and wants to have her IUD removed. Once to go ahead and start on oral contraceptives. I reviewed alternatives with her to include a trial of progesterone only to see if we can't in her endometrium before removing it to see if this doesn't go away with the bleeding but she's already convinced that she wants the IUD removed.    Exam with Kim assistant Abdomen soft nontender without masses guarding rebound organomegaly. Pelvic external BUS vagina with thick white discharge. Cervix normal IUD string visualized. Uterus normal size midline mobile nontender. Adnexa without masses or tenderness.  Assessment and plan: 1. White discharge. KOH wet prep positive for bacterial vaginosis. We'll treat with Flagyl 500 mg twice daily for 7 days, alcohol avoidance reviewed. 2. IUD management. I again reviewed options to try to save the IUD but she wants it removed and subsequently the IUD string was grasped with a [place] forceps and removed, shown to the patient and discarded. We'll have him start on Loestrin 120 equivalent. She is due for her annual in May given her several refills and she'll represent for her annual exam at that time. We'll start her pills today.  Plan check baseline TSH prolactin given the dysfunctional bleeding pattern for completeness. If her irregular bleeding which continues then consider ultrasound evaluation.

## 2012-06-14 ENCOUNTER — Encounter: Payer: 59 | Admitting: Gynecology

## 2012-07-09 ENCOUNTER — Encounter (HOSPITAL_COMMUNITY): Payer: Self-pay | Admitting: Emergency Medicine

## 2012-07-09 DIAGNOSIS — Z8614 Personal history of Methicillin resistant Staphylococcus aureus infection: Secondary | ICD-10-CM | POA: Insufficient documentation

## 2012-07-09 DIAGNOSIS — J029 Acute pharyngitis, unspecified: Secondary | ICD-10-CM | POA: Insufficient documentation

## 2012-07-09 DIAGNOSIS — R51 Headache: Secondary | ICD-10-CM | POA: Insufficient documentation

## 2012-07-09 DIAGNOSIS — H538 Other visual disturbances: Secondary | ICD-10-CM | POA: Insufficient documentation

## 2012-07-09 DIAGNOSIS — Z9889 Other specified postprocedural states: Secondary | ICD-10-CM | POA: Insufficient documentation

## 2012-07-09 DIAGNOSIS — Z8619 Personal history of other infectious and parasitic diseases: Secondary | ICD-10-CM | POA: Insufficient documentation

## 2012-07-09 DIAGNOSIS — Z3202 Encounter for pregnancy test, result negative: Secondary | ICD-10-CM | POA: Insufficient documentation

## 2012-07-09 DIAGNOSIS — R42 Dizziness and giddiness: Secondary | ICD-10-CM | POA: Insufficient documentation

## 2012-07-09 NOTE — ED Notes (Signed)
C/o sore throat x 4 days.  Reports headache that started today and vomited x 2 tonight.

## 2012-07-10 ENCOUNTER — Emergency Department (HOSPITAL_COMMUNITY)
Admission: EM | Admit: 2012-07-10 | Discharge: 2012-07-10 | Disposition: A | Discharge status: Home / Self Care | Payer: BC Managed Care – PPO | Attending: Emergency Medicine | Admitting: Emergency Medicine

## 2012-07-10 DIAGNOSIS — R519 Headache, unspecified: Secondary | ICD-10-CM

## 2012-07-10 DIAGNOSIS — J029 Acute pharyngitis, unspecified: Secondary | ICD-10-CM

## 2012-07-10 LAB — POCT I-STAT, CHEM 8
Creatinine, Ser: 0.9 mg/dL (ref 0.50–1.10)
Hemoglobin: 12.9 g/dL (ref 12.0–15.0)
Sodium: 140 mEq/L (ref 135–145)
TCO2: 27 mmol/L (ref 0–100)

## 2012-07-10 MED ORDER — AMOXICILLIN 500 MG PO CAPS: 500.0000 mg | ORAL_CAPSULE | Freq: Three times a day (TID) | ORAL | Status: DC

## 2012-07-10 MED ORDER — AMOXICILLIN 500 MG PO CAPS
500.0000 mg | ORAL_CAPSULE | Freq: Once | ORAL | Status: AC
  Administered 2012-07-10: 500 mg via ORAL
  Filled 2012-07-10: qty 1

## 2012-07-10 MED ORDER — HYDROCODONE-ACETAMINOPHEN 7.5-325 MG/15ML PO SOLN
30.0000 mL | Freq: Four times a day (QID) | ORAL | Status: DC | PRN
Start: 2012-07-10 — End: 2012-10-02

## 2012-07-10 MED ORDER — DEXAMETHASONE SODIUM PHOSPHATE 10 MG/ML IJ SOLN
10.0000 mg | Freq: Once | INTRAMUSCULAR | Status: AC
  Administered 2012-07-10: 10 mg via INTRAVENOUS
  Filled 2012-07-10: qty 1

## 2012-07-10 MED ORDER — DIPHENHYDRAMINE HCL 50 MG/ML IJ SOLN
25.0000 mg | Freq: Once | INTRAMUSCULAR | Status: AC
  Administered 2012-07-10: 25 mg via INTRAVENOUS
  Filled 2012-07-10: qty 1

## 2012-07-10 MED ORDER — ONDANSETRON HCL 4 MG/2ML IJ SOLN
4.0000 mg | Freq: Once | INTRAMUSCULAR | Status: AC
  Administered 2012-07-10: 4 mg via INTRAVENOUS
  Filled 2012-07-10: qty 2

## 2012-07-10 MED ORDER — SODIUM CHLORIDE 0.9 % IV BOLUS (SEPSIS)
1000.0000 mL | Freq: Once | INTRAVENOUS | Status: AC
  Administered 2012-07-10: 1000 mL via INTRAVENOUS

## 2012-07-10 MED ORDER — KETOROLAC TROMETHAMINE 30 MG/ML IJ SOLN
30.0000 mg | Freq: Once | INTRAMUSCULAR | Status: AC
  Administered 2012-07-10: 30 mg via INTRAVENOUS
  Filled 2012-07-10: qty 1

## 2012-07-10 NOTE — ED Provider Notes (Signed)
History     CSN: [id number]  Arrival date & time 07/09/12  2316   First MD Initiated Contact with Patient 07/10/12 [phone]      Chief Complaint  Patient presents with  . Headache  . Sore Throat    (Consider location/radiation/quality/duration/timing/severity/associated sxs/prior treatment) HPI  Patient to the ER with complaints of sore throat for 4 days and onset of headache this evening. Her throat feels like ground glass but she is not having difficulty breathing or swallowing- no change in voice. She developed a headache this evening that is all over but worse in the frontal lobe and feels like pressure or a squeezing. She also describes having some blurry vision. NO weakness, neck pain, fevers, confusion, diarrhea, abdominal pains, dysuria, vaginal discharge or bleeding. Denies significant past medical history. Normally healthy. Does typically get migraines but this feels different.   Past Medical History  Diagnosis Date  . MRSA (methicillin resistant staph aureus) culture positive 2009    thigh  . HSV (herpes simplex virus) anogenital infection     Past Surgical History  Procedure Laterality Date  . Nasal surgery    . Mouth surgery    . Intrauterine device insertion  11/28/2011    Mirena    Family History  Problem Relation Age of Onset  . Hypertension Maternal Grandmother   . Breast cancer Maternal Grandmother   . Breast cancer Paternal Grandmother     History  Substance Use Topics  . Smoking status: Never Smoker   . Smokeless tobacco: Never Used  . Alcohol Use: Yes     Comment: rarely    OB History   Grav Para Term Preterm Abortions TAB SAB Ect Mult Living   1 1 1       1       Review of Systems  HENT: Positive for sore throat.   Neurological: Positive for dizziness and headaches.  All other systems reviewed and are negative.    Allergies  Shellfish allergy  Home Medications   Current Outpatient Rx  Name  Route  Sig  Dispense  Refill  .  amoxicillin (AMOXIL) 500 MG capsule   Oral   Take 1 capsule (500 mg total) by mouth 3 (three) times daily.   21 capsule   0   . HYDROcodone-acetaminophen (HYCET) 7.5-325 mg/15 ml solution   Oral   Take 30 mLs by mouth 4 (four) times daily as needed for pain.   120 mL   0     BP 106/66  Pulse 74  Temp(Src) 98.2 F (36.8 C) (Oral)  Resp 16  SpO2 99%  LMP 07/02/2012  Physical Exam  Nursing note and vitals reviewed. Constitutional: She is oriented to person, place, and time. She appears well-developed and well-nourished. No distress.  HENT:  Head: Normocephalic and atraumatic.  Mouth/Throat: Uvula is midline, oropharynx is clear and moist and mucous membranes are normal. No edematous. No oropharyngeal exudate, posterior oropharyngeal edema, posterior oropharyngeal erythema or tonsillar abscesses.  Eyes: Pupils are equal, round, and reactive to light.  Neck: Normal range of motion. Neck supple. No spinous process tenderness and no muscular tenderness present. No rigidity. Normal range of motion present. No Brudzinski's sign and no Kernig's sign noted.  Cardiovascular: Normal rate and regular rhythm.   Pulmonary/Chest: Effort normal.  Abdominal: Soft.  Neurological: She is alert and oriented to person, place, and time. She has normal strength. No cranial nerve deficit or sensory deficit.  Skin: Skin is warm and dry.  ED Course  Procedures (including critical care time)  Labs Reviewed  RAPID STREP SCREEN  CULTURE, GROUP A STREP  POCT I-STAT, CHEM 8  POCT PREGNANCY, URINE   No results found.   1. Headache   2. Sore throat       MDM  Negative strep screen, I-stat 8 WNL.  NO physical exam abnormalities. Patient started on 1L normal saline, benadryl, zofran, decadron and Morphine. Nurse informed me that she started to dry heave right after administration of medication. Will monitor.  5:05am- patients headache has completely resolved. She admits that she feels a  lot better but still has mild sore throat. Will give abx and pain medication. She is to follow-up with her pcp.  Filed Vitals:   07/10/12 0500  BP: 115/74  Pulse: 79  Temp:   Resp:      Pt has been advised of the symptoms that warrant their return to the ED. Patient has voiced understanding and has agreed to follow-up with the PCP or specialist.       Dorthula Matas, PA-C 07/10/12 [phone]

## 2012-07-10 NOTE — ED Notes (Signed)
Pt resting comfortably, states feels batter at this time.

## 2012-07-10 NOTE — ED Provider Notes (Signed)
Medical screening examination/treatment/procedure(s) were performed by non-physician practitioner and as supervising physician I was immediately available for consultation/collaboration.   Julie Manly, MD 07/10/12 0834 

## 2012-07-10 NOTE — ED Notes (Signed)
Pt comfortable with d/c and f/u instructions. Prescriptions x2. 

## 2012-07-10 NOTE — ED Notes (Addendum)
Immediately after administering decadron and zofran, pt became very nauseous and began dry heaving. EDPA made aware

## 2012-07-12 LAB — CULTURE, GROUP A STREP

## 2012-09-22 ENCOUNTER — Ambulatory Visit: Payer: Self-pay | Admitting: Gynecology

## 2012-10-02 ENCOUNTER — Encounter (HOSPITAL_COMMUNITY): Payer: Self-pay | Admitting: Emergency Medicine

## 2012-10-02 ENCOUNTER — Emergency Department (HOSPITAL_COMMUNITY)
Admission: EM | Admit: 2012-10-02 | Discharge: 2012-10-02 | Disposition: A | Discharge status: Home / Self Care | Payer: BC Managed Care – PPO | Attending: Emergency Medicine | Admitting: Emergency Medicine

## 2012-10-02 DIAGNOSIS — Z8614 Personal history of Methicillin resistant Staphylococcus aureus infection: Secondary | ICD-10-CM | POA: Insufficient documentation

## 2012-10-02 DIAGNOSIS — H9203 Otalgia, bilateral: Secondary | ICD-10-CM

## 2012-10-02 DIAGNOSIS — J029 Acute pharyngitis, unspecified: Secondary | ICD-10-CM | POA: Insufficient documentation

## 2012-10-02 DIAGNOSIS — R509 Fever, unspecified: Secondary | ICD-10-CM | POA: Insufficient documentation

## 2012-10-02 DIAGNOSIS — R599 Enlarged lymph nodes, unspecified: Secondary | ICD-10-CM | POA: Insufficient documentation

## 2012-10-02 DIAGNOSIS — R5381 Other malaise: Secondary | ICD-10-CM | POA: Insufficient documentation

## 2012-10-02 DIAGNOSIS — H9209 Otalgia, unspecified ear: Secondary | ICD-10-CM | POA: Insufficient documentation

## 2012-10-02 LAB — RAPID STREP SCREEN (MED CTR MEBANE ONLY): Streptococcus, Group A Screen (Direct): NEGATIVE

## 2012-10-02 MED ORDER — HYDROCODONE-ACETAMINOPHEN 7.5-325 MG/15ML PO SOLN: 15.0000 mL | Freq: Three times a day (TID) | ORAL | Status: DC | PRN

## 2012-10-02 NOTE — ED Provider Notes (Signed)
CSN: [id number]     Arrival date & time 10/02/12  1601 History   This chart was scribed for Antony Madura, non-physician practitioner working with Juliet Rude. Rubin Payor, MD by Leone Payor, ED Scribe. This patient was seen in room TR11C/TR11C and the patient's care was started at 1601.   First MD Initiated Contact with Patient 10/02/12 1636     Chief Complaint  Patient presents with  . Sore Throat  . Otalgia   The history is provided by the patient. No language interpreter was used.   HPI Comments: Alisha Edwards is a 22 y.o. female who presents to the Emergency Department complaining of constant, gradually worsening sore throat that began three days ago with associated bilateral otalgia that began 2 days ago. Pt describes the pain as sharp.  Pt is experiencing associated fatigue and pain with swallowing, and has had a fever (Tmax 99) and mouth dryness.  Pain is worsened with tongue movement and opening her mouth. Pt has taken Benadryl with minimal relief and has tried salt water gargles on the first day but has not tried any other at home treatments.  Pt has had some ongoing dental pain but she believes it is unrelated to her sore throat.  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  Pt denies ear discharge, bleeding or lesions in the mouth, eye redness, drooling, shortness of breath.      Past Medical History  Diagnosis Date  . MRSA (methicillin resistant staph aureus) culture positive 2009    thigh  . HSV (herpes simplex virus) anogenital infection    Past Surgical History  Procedure Laterality Date  . Nasal surgery    . Mouth surgery    . Intrauterine device insertion  11/28/2011    Mirena   Family History  Problem Relation Age of Onset  . Hypertension Maternal Grandmother   . Breast cancer Maternal Grandmother   . Breast cancer Paternal Grandmother    History  Substance Use Topics  . Smoking status: Never Smoker   . Smokeless tobacco: Never Used  . Alcohol  Use: Yes     Comment: rarely   OB History   Grav Para Term Preterm Abortions TAB SAB Ect Mult Living   1 1 1       1      Review of Systems  Constitutional: Positive for fever and fatigue.       Low grade fever of around 99.  HENT: Positive for ear pain and sore throat. Negative for drooling, mouth sores and ear discharge.   Eyes: Negative for redness.  Respiratory: Negative for shortness of breath.   All other systems reviewed and are negative.   Allergies  Shellfish allergy  Home Medications   Current Outpatient Rx  Name  Route  Sig  Dispense  Refill  . HYDROcodone-acetaminophen (HYCET) 7.5-325 mg/15 ml solution   Oral   Take 15 mLs by mouth every 8 (eight) hours as needed for pain.   120 mL   0    BP 107/60  Pulse 79  Temp(Src) 98.5 F (36.9 C) (Oral)  Resp 16  SpO2 100% Physical Exam  Nursing note and vitals reviewed. Constitutional: She is oriented to person, place, and time. She appears well-developed and well-nourished. No distress.  HENT:  Head: Normocephalic and atraumatic.  Right Ear: Tympanic membrane, external ear and ear canal normal. No mastoid tenderness.  Left Ear: Tympanic membrane, external ear and ear canal normal. No mastoid tenderness.  Mouth/Throat: Uvula is midline and mucous membranes are normal. No trismus in the jaw. No edematous. Posterior oropharyngeal erythema present. No oropharyngeal exudate, posterior oropharyngeal edema or tonsillar abscesses.  External ears, canals, and TMs normal . No mastoid tenderness. Erythema of posterior pharynx with enlargement of left tonsil.  No tonsilar exudates. Uvula midline.  Eyes: Conjunctivae and EOM are normal. No scleral icterus.  Neck: Normal range of motion.  Cardiovascular: Normal rate, regular rhythm and normal heart sounds.   Pulmonary/Chest: Effort normal. No stridor. No respiratory distress.  Musculoskeletal: Normal range of motion.  Lymphadenopathy:    She has cervical adenopathy.   Neurological: She is alert and oriented to person, place, and time.  Skin: Skin is warm and dry. No rash noted. She is not diaphoretic. No erythema. No pallor.  Psychiatric: She has a normal mood and affect. Her behavior is normal.    ED Course  DIAGNOSTIC STUDIES: Oxygen Saturation is 100% on RA, normal by my interpretation.    COORDINATION OF CARE: 5:00 PM Discussed treatment plan with pt at bedside and pt agreed to plan.   Procedures (including critical care time)  Labs Reviewed  RAPID STREP SCREEN  CULTURE, GROUP A STREP   No results found.  1. Viral pharyngitis   2. Otalgia of both ears    MDM  Patient presents for sore throat and bilateral otalgia x3 days. Patient is well and nontoxic appearing and afebrile. Physical exam findings as above. Airway patent and patient tolerating secretions without difficulty. Uvula midline without evidence of peritonsillar abscess. Bilateral ears, canals, and TMs normal; there is no mastoid tenderness. There is no stridor. Rapid strep screen negative. Symptoms most consistent with viral pharyngitis. Patient appropriate for discharge with primary care followup as needed. Supportive treatment with saltwater gargles, ibuprofen, Chloraseptic spray, and adequate fluid hydration recommended. Hycet prescribed for sore throat as needed. Indications for ED return provided and patient agreeable to plan.  I personally performed the services described in this documentation, which was scribed in my presence. The recorded information has been reviewed and is accurate.    Antony Madura, PA-C 10/02/12 1725

## 2012-10-02 NOTE — ED Notes (Signed)
Onset 3 days ago sore throat and bilateral ear pain.  Pain 10/10 achy soreness.  Airway intact bilateral equal chest rise and fall.

## 2012-10-02 NOTE — ED Provider Notes (Signed)
Medical screening examination/treatment/procedure(s) were performed by non-physician practitioner and as supervising physician I was immediately available for consultation/collaboration.  Jerita Wimbush R. Glenola Wheat, MD 10/02/12 2241 

## 2012-10-04 LAB — CULTURE, GROUP A STREP

## 2012-11-07 ENCOUNTER — Encounter (HOSPITAL_BASED_OUTPATIENT_CLINIC_OR_DEPARTMENT_OTHER): Payer: Self-pay | Admitting: Emergency Medicine

## 2012-11-07 ENCOUNTER — Emergency Department (HOSPITAL_BASED_OUTPATIENT_CLINIC_OR_DEPARTMENT_OTHER)
Admission: EM | Admit: 2012-11-07 | Discharge: 2012-11-07 | Disposition: A | Discharge status: Home / Self Care | Payer: BC Managed Care – PPO | Attending: Emergency Medicine | Admitting: Emergency Medicine

## 2012-11-07 DIAGNOSIS — R52 Pain, unspecified: Secondary | ICD-10-CM | POA: Insufficient documentation

## 2012-11-07 DIAGNOSIS — J069 Acute upper respiratory infection, unspecified: Secondary | ICD-10-CM | POA: Insufficient documentation

## 2012-11-07 DIAGNOSIS — R197 Diarrhea, unspecified: Secondary | ICD-10-CM | POA: Insufficient documentation

## 2012-11-07 DIAGNOSIS — Z8614 Personal history of Methicillin resistant Staphylococcus aureus infection: Secondary | ICD-10-CM | POA: Insufficient documentation

## 2012-11-07 DIAGNOSIS — R110 Nausea: Secondary | ICD-10-CM | POA: Insufficient documentation

## 2012-11-07 MED ORDER — OXYMETAZOLINE HCL 0.05 % NA SOLN
2.0000 | Freq: Two times a day (BID) | NASAL | Status: DC | PRN
  Administered 2012-11-07: 2 via NASAL
  Filled 2012-11-07: qty 15

## 2012-11-07 MED ORDER — HYDROCOD POLST-CHLORPHEN POLST 10-8 MG/5ML PO LQCR: 5.0000 mL | Freq: Two times a day (BID) | ORAL | Status: DC | PRN

## 2012-11-07 MED ORDER — NAPROXEN 250 MG PO TABS
500.0000 mg | ORAL_TABLET | Freq: Once | ORAL | Status: AC
  Administered 2012-11-07: 500 mg via ORAL
  Filled 2012-11-07: qty 2

## 2012-11-07 NOTE — ED Provider Notes (Signed)
CSN: [id number]     Arrival date & time 11/07/12  0354 History   None    Chief Complaint  Patient presents with  . URI   (Consider location/radiation/quality/duration/timing/severity/associated sxs/prior Treatment) HPI This is a 22 year old female with a three-day history of cold symptoms. She has had sick contacts at home. She complains of nasal congestion, sinus headache, cough and body aches. She denies vomiting but has had some nausea and diarrhea. She denies a fever. She has not been wheezing. She has taken her child's amoxicillin without relief.  Past Medical History  Diagnosis Date  . MRSA (methicillin resistant staph aureus) culture positive 2009    thigh  . HSV (herpes simplex virus) anogenital infection    Past Surgical History  Procedure Laterality Date  . Nasal surgery    . Mouth surgery    . Intrauterine device insertion  11/28/2011    Mirena   Family History  Problem Relation Age of Onset  . Hypertension Maternal Grandmother   . Breast cancer Maternal Grandmother   . Breast cancer Paternal Grandmother    History  Substance Use Topics  . Smoking status: Never Smoker   . Smokeless tobacco: Never Used  . Alcohol Use: Yes     Comment: rarely   OB History   Grav Para Term Preterm Abortions TAB SAB Ect Mult Living   1 1 1       1      Review of Systems  All other systems reviewed and are negative.    Allergies  Shellfish allergy  Home Medications   Current Outpatient Rx  Name  Route  Sig  Dispense  Refill  . HYDROcodone-acetaminophen (HYCET) 7.5-325 mg/15 ml solution   Oral   Take 15 mLs by mouth every 8 (eight) hours as needed for pain.   120 mL   0    BP 108/69  Pulse 86  Temp(Src) 98.3 F (36.8 C) (Oral)  Resp 20  Ht 5\' 5"  (1.651 m)  Wt 128 lb (58.06 kg)  BMI 21.3 kg/m2  SpO2 100%  LMP 09/29/2012  Physical Exam General: Well-developed, well-nourished female in no acute distress; appearance consistent with age of record HENT:  normocephalic; atraumatic; TMs normal bilaterally; left TM partially obscured by cerumen; no pharyngeal erythema, exudate or edema; no dysphonia; nasal congestion Eyes: pupils equal, round and reactive to light; extraocular muscles intact Neck: supple Heart: regular rate and rhythm Lungs: clear to auscultation bilaterally Abdomen: soft; nondistended Extremities: No deformity; full range of motion Neurologic: Awake, alert and oriented; motor function intact in all extremities and symmetric; no facial droop Skin: Warm and dry Psychiatric: Normal mood and affect    ED Course  Procedures (including critical care time)  MDM      Hanley Seamen, MD 11/07/12 0430

## 2012-11-07 NOTE — ED Notes (Signed)
Pt presents to Ed for headache, body aches, and nasal congestion. Pt reports that her daughter sister both have been sick with URI and pt believes she has caught what they have. Pt report nausea  and diarrhea today but denies any vomiting or fever. Pt has clear lungs bilaterally.

## 2012-12-08 ENCOUNTER — Ambulatory Visit (INDEPENDENT_AMBULATORY_CARE_PROVIDER_SITE_OTHER): Payer: Self-pay | Admitting: Women's Health

## 2012-12-08 ENCOUNTER — Encounter: Payer: Self-pay | Admitting: Women's Health

## 2012-12-08 DIAGNOSIS — B009 Herpesviral infection, unspecified: Secondary | ICD-10-CM

## 2012-12-08 DIAGNOSIS — IMO0001 Reserved for inherently not codable concepts without codable children: Secondary | ICD-10-CM

## 2012-12-08 DIAGNOSIS — B9689 Other specified bacterial agents as the cause of diseases classified elsewhere: Secondary | ICD-10-CM

## 2012-12-08 DIAGNOSIS — A499 Bacterial infection, unspecified: Secondary | ICD-10-CM

## 2012-12-08 DIAGNOSIS — N898 Other specified noninflammatory disorders of vagina: Secondary | ICD-10-CM

## 2012-12-08 DIAGNOSIS — Z309 Encounter for contraceptive management, unspecified: Secondary | ICD-10-CM

## 2012-12-08 DIAGNOSIS — N760 Acute vaginitis: Secondary | ICD-10-CM

## 2012-12-08 LAB — WET PREP FOR TRICH, YEAST, CLUE: Yeast Wet Prep HPF POC: NONE SEEN

## 2012-12-08 MED ORDER — NORGESTIMATE-ETH ESTRADIOL 0.25-35 MG-MCG PO TABS: 1.0000 | ORAL_TABLET | Freq: Every day | ORAL | Status: DC

## 2012-12-08 MED ORDER — METRONIDAZOLE 0.75 % VA GEL: VAGINAL | Status: DC

## 2012-12-08 MED ORDER — VALACYCLOVIR HCL 500 MG PO TABS: ORAL_TABLET | ORAL | Status: DC

## 2012-12-08 NOTE — Progress Notes (Signed)
Patient ID: Alisha Edwards, female   DOB: [birth year], 21 y.o.   MRN: [id number] Presents with complaint of vaginal discharge with irritation. Same partner. Monthly cycle/condoms. History of HSV with rare outbreaks, but feels like one is starting. Questions contraception options. Denies urinary symptoms, abdominal pain or fever.  Exam: Appears well. External genitalia within normal limits, speculum exam copious yellow discharge, adherent to vaginal walls, wet prep negative for yeast amines clues, and positive for TNTC WBCs, TNTC bacteria. Bimanual no CMT or adnexal fullness or tenderness.  Vaginal discharge Contraception management HSV  Plan: MetroGel vaginal cream 1 applicator at bedtime x5, instructed to call if discharge does not resolve. Alcohol precautions reviewed. Contraception options reviewed will try Ortho-Cyclen, prescription, proper use given and reviewed, condoms until and first month. Valtrex 500 twice daily for 3-5 days as needed, prescription proper use given and reviewed. Instructed to schedule annual exam.

## 2012-12-16 ENCOUNTER — Other Ambulatory Visit: Payer: Self-pay

## 2012-12-30 ENCOUNTER — Encounter: Payer: Self-pay | Admitting: Women's Health

## 2013-01-20 ENCOUNTER — Encounter: Payer: Self-pay | Admitting: Women's Health

## 2013-02-19 ENCOUNTER — Encounter (HOSPITAL_BASED_OUTPATIENT_CLINIC_OR_DEPARTMENT_OTHER): Payer: Self-pay | Admitting: Emergency Medicine

## 2013-02-19 DIAGNOSIS — IMO0001 Reserved for inherently not codable concepts without codable children: Secondary | ICD-10-CM | POA: Insufficient documentation

## 2013-02-19 DIAGNOSIS — J069 Acute upper respiratory infection, unspecified: Secondary | ICD-10-CM | POA: Insufficient documentation

## 2013-02-19 DIAGNOSIS — Z8614 Personal history of Methicillin resistant Staphylococcus aureus infection: Secondary | ICD-10-CM | POA: Insufficient documentation

## 2013-02-19 DIAGNOSIS — Z79899 Other long term (current) drug therapy: Secondary | ICD-10-CM | POA: Insufficient documentation

## 2013-02-19 DIAGNOSIS — Z8619 Personal history of other infectious and parasitic diseases: Secondary | ICD-10-CM | POA: Insufficient documentation

## 2013-02-19 DIAGNOSIS — Z3202 Encounter for pregnancy test, result negative: Secondary | ICD-10-CM | POA: Insufficient documentation

## 2013-02-19 NOTE — ED Notes (Signed)
Pt reports headache and generalized body ache with cough  x2 days

## 2013-02-20 ENCOUNTER — Emergency Department (HOSPITAL_BASED_OUTPATIENT_CLINIC_OR_DEPARTMENT_OTHER)
Admission: EM | Admit: 2013-02-20 | Discharge: 2013-02-20 | Disposition: A | Discharge status: Home / Self Care | Payer: BC Managed Care – PPO | Attending: Emergency Medicine | Admitting: Emergency Medicine

## 2013-02-20 DIAGNOSIS — J069 Acute upper respiratory infection, unspecified: Secondary | ICD-10-CM

## 2013-02-20 LAB — PREGNANCY, URINE: PREG TEST UR: NEGATIVE

## 2013-02-20 MED ORDER — IBUPROFEN 800 MG PO TABS
800.0000 mg | ORAL_TABLET | Freq: Once | ORAL | Status: AC
  Administered 2013-02-20: 800 mg via ORAL
  Filled 2013-02-20: qty 1

## 2013-02-20 MED ORDER — ACETAMINOPHEN 500 MG PO TABS
1000.0000 mg | ORAL_TABLET | Freq: Once | ORAL | Status: AC
  Administered 2013-02-20: 1000 mg via ORAL
  Filled 2013-02-20: qty 2

## 2013-02-20 NOTE — ED Provider Notes (Signed)
CSN: [id number]     Arrival date & time 02/19/13  2227 History  This chart was scribed for Maisee Vollman Smitty Cords, MD by Leone Payor, ED Scribe. This patient was seen in room MH06/MH06 and the patient's care was started 12:43 AM.    Chief Complaint  Patient presents with  . Generalized Body Aches    Patient is a 23 y.o. female presenting with URI. The history is provided by the patient. No language interpreter was used.  URI Presenting symptoms: cough and sore throat   Presenting symptoms: no congestion, no fever and no rhinorrhea   Cough:    Cough characteristics:  Non-productive   Severity:  Mild   Onset quality:  Gradual   Duration:  1 day   Timing:  Intermittent   Progression:  Unchanged   Chronicity:  New Severity:  Moderate Onset quality:  Gradual Timing:  Constant Progression:  Unchanged Chronicity:  New Relieved by:  Nothing Worsened by:  Nothing tried Ineffective treatments:  None tried Associated symptoms: headaches and myalgias   Headaches:    Severity:  Moderate   Onset quality:  Sudden   Duration:  1 day   Timing:  Constant   Progression:  Unchanged Myalgias:    Location:  Generalized   Quality:  Aching   Severity:  Moderate Risk factors: not elderly     HPI Comments: Alisha Edwards is a 23 y.o. female who presents to the Emergency Department complaining of 2 days of gradual onset, gradually worsening, constant myalgias with associated HA, sore throat and cough. She has taken Mucinex without relief. She denies fever, nasal congestion, rhinorrhea. LNMP was yesterday. She reports this was 1 month late but states having irregular periods.   Past Medical History  Diagnosis Date  . MRSA (methicillin resistant staph aureus) culture positive 2009    thigh  . HSV (herpes simplex virus) anogenital infection    Past Surgical History  Procedure Laterality Date  . Nasal surgery    . Mouth surgery    . Intrauterine device insertion  11/28/2011    Mirena    Family History  Problem Relation Age of Onset  . Hypertension Maternal Grandmother   . Breast cancer Maternal Grandmother   . Breast cancer Paternal Grandmother    History  Substance Use Topics  . Smoking status: Never Smoker   . Smokeless tobacco: Never Used  . Alcohol Use: Yes     Comment: rarely   OB History   Grav Para Term Preterm Abortions TAB SAB Ect Mult Living   1 1 1       1      Review of Systems  Constitutional: Negative for fever.  HENT: Positive for sore throat. Negative for congestion and rhinorrhea.   Respiratory: Positive for cough. Negative for shortness of breath.   Musculoskeletal: Positive for myalgias.  Neurological: Positive for headaches.  All other systems reviewed and are negative.    Allergies  Shellfish allergy  Home Medications   Current Outpatient Rx  Name  Route  Sig  Dispense  Refill  . metroNIDAZOLE (METROGEL VAGINAL) 0.75 % vaginal gel      1 applicator per vagina at HS x 5   70 g   0   . norgestimate-ethinyl estradiol (SPRINTEC 28) 0.25-35 MG-MCG tablet   Oral   Take 1 tablet by mouth daily.   3 Package   4   . valACYclovir (VALTREX) 500 MG tablet      Take twice daily for  3-5 days   30 tablet   12    BP 118/69  Pulse 87  Temp(Src) 99 F (37.2 C) (Oral)  Resp 16  Wt 125 lb (56.7 kg)  SpO2 100%  LMP 02/19/2013 Physical Exam  Nursing note and vitals reviewed. Constitutional: She is oriented to person, place, and time. She appears well-developed and well-nourished.  HENT:  Head: Normocephalic and atraumatic.  Right Ear: External ear normal.  Left Ear: External ear normal.  Nose: Nose normal.  Mouth/Throat: Oropharynx is clear and moist and mucous membranes are normal. No oropharyngeal exudate or posterior oropharyngeal erythema.  Eyes: EOM are normal. Pupils are equal, round, and reactive to light.  Neck: Normal range of motion. Neck supple.  Cardiovascular: Normal rate, regular rhythm and normal heart sounds.    Pulmonary/Chest: Effort normal and breath sounds normal. No respiratory distress. She has no wheezes. She has no rales.  Abdominal: Soft. Bowel sounds are normal. She exhibits no distension. There is no tenderness. There is no rebound and no guarding.  Musculoskeletal: Normal range of motion. She exhibits no edema and no tenderness.  Lymphadenopathy:    She has no cervical adenopathy.  Neurological: She is alert and oriented to person, place, and time. She has normal reflexes. She displays normal reflexes. No cranial nerve deficit.  Skin: Skin is warm and dry.  Psychiatric: She has a normal mood and affect.    ED Course  Procedures (including critical care time)  DIAGNOSTIC STUDIES: Oxygen Saturation is 100% on RA, normal by my interpretation.    COORDINATION OF CARE: 12:47 AM Discussed treatment plan with pt at bedside and pt agreed to plan.   Labs Review Labs Reviewed  PREGNANCY, URINE   Imaging Review No results found.  EKG Interpretation   None       MDM  No diagnosis found. Alternate tylenol and motrin   I personally performed the services described in this documentation, which was scribed in my presence. The recorded information has been reviewed and is accurate.      Jasmine AweApril K Micharl Helmes-Rasch, MD 02/20/13 0140

## 2013-02-20 NOTE — Discharge Instructions (Signed)
Cool Mist Vaporizers °Vaporizers may help relieve the symptoms of a cough and cold. They add moisture to the air, which helps mucus to become thinner and less sticky. This makes it easier to breathe and cough up secretions. Cool mist vaporizers do not cause serious burns like hot mist vaporizers ("steamers, humidifiers"). Vaporizers have not been proved to show they help with colds. You should not use a vaporizer if you are allergic to mold.  °HOME CARE INSTRUCTIONS °· Follow the package instructions for the vaporizer. °· Do not use anything other than distilled water in the vaporizer. °· Do not run the vaporizer all of the time. This can cause mold or bacteria to grow in the vaporizer. °· Clean the vaporizer after each time it is used. °· Clean and dry the vaporizer well before storing it. °· Stop using the vaporizer if worsening respiratory symptoms develop. °Document Released: 10/25/2003 Document Revised: 09/29/2012 Document Reviewed: 06/16/2012 °ExitCare® Patient Information ©2014 [employer]. ° °

## 2013-02-23 NOTE — ED Notes (Signed)
Pt called requesting to have a prescription called in for cough medications because the robutussin is not working or to have tamiflu called for her because family has the flu.  Explained that the doctor who saw her on 02/19/13 is not here and if she is worse, she will need to be reseen by her pcp or come back to the ed.

## 2013-03-21 ENCOUNTER — Ambulatory Visit (INDEPENDENT_AMBULATORY_CARE_PROVIDER_SITE_OTHER): Payer: BC Managed Care – PPO | Admitting: Gynecology

## 2013-03-21 ENCOUNTER — Encounter: Payer: Self-pay | Admitting: Gynecology

## 2013-03-21 VITALS — BP 120/76 | Ht 66.0 in | Wt 130.0 lb

## 2013-03-21 DIAGNOSIS — Z309 Encounter for contraceptive management, unspecified: Secondary | ICD-10-CM

## 2013-03-21 DIAGNOSIS — Z01419 Encounter for gynecological examination (general) (routine) without abnormal findings: Secondary | ICD-10-CM

## 2013-03-21 DIAGNOSIS — Z113 Encounter for screening for infections with a predominantly sexual mode of transmission: Secondary | ICD-10-CM

## 2013-03-21 LAB — CBC WITH DIFFERENTIAL/PLATELET
BASOS ABS: 0 10*3/uL (ref 0.0–0.1)
Basophils Relative: 0 % (ref 0–1)
EOS PCT: 1 % (ref 0–5)
Eosinophils Absolute: 0.1 10*3/uL (ref 0.0–0.7)
HCT: 39.2 % (ref 36.0–46.0)
Hemoglobin: 13.5 g/dL (ref 12.0–15.0)
LYMPHS PCT: 25 % (ref 12–46)
Lymphs Abs: 1.7 10*3/uL (ref 0.7–4.0)
MCH: 31.3 pg (ref 26.0–34.0)
MCHC: 34.4 g/dL (ref 30.0–36.0)
MCV: 91 fL (ref 78.0–100.0)
Monocytes Absolute: 0.4 10*3/uL (ref 0.1–1.0)
Monocytes Relative: 6 % (ref 3–12)
NEUTROS ABS: 4.6 10*3/uL (ref 1.7–7.7)
Neutrophils Relative %: 68 % (ref 43–77)
PLATELETS: 246 10*3/uL (ref 150–400)
RBC: 4.31 MIL/uL (ref 3.87–5.11)
RDW: 14.3 % (ref 11.5–15.5)
WBC: 6.8 10*3/uL (ref 4.0–10.5)

## 2013-03-21 MED ORDER — NORELGESTROMIN-ETH ESTRADIOL 150-35 MCG/24HR TD PTWK: 1.0000 | MEDICATED_PATCH | TRANSDERMAL | Status: DC

## 2013-03-21 MED ORDER — VALACYCLOVIR HCL 500 MG PO TABS: 500.0000 mg | ORAL_TABLET | Freq: Every day | ORAL | Status: DC

## 2013-03-21 NOTE — Patient Instructions (Signed)
Followup for the Gardasil series if you choose to initiate. Start on the Ortho Evra patch. Call or followup if you have any issues with this. Followup in one year for annual exam.

## 2013-03-21 NOTE — Progress Notes (Signed)
Alisha Edwards 07/03/1990 [id number]        23 y.o.  G1P1001 for annual exam.  Several issues noted below.  Past medical history,surgical history, problem list, medications, allergies, family history and social history were all reviewed and documented in the EPIC chart.  ROS:  Performed and pertinent positives and negatives are included in the history, assessment and plan .  Exam: Kim assistant Filed Vitals:   03/21/13 1148  BP: 120/76  Height: 5\' 6"  (1.676 m)  Weight: 130 lb (58.968 kg)   General appearance  Normal Skin grossly normal Head/Neck normal with no cervical or supraclavicular adenopathy thyroid normal Lungs  clear Cardiac RR, without RMG Abdominal  soft, nontender, without masses, organomegaly or hernia Breasts  examined lying and sitting without masses, retractions, discharge or axillary adenopathy. Pelvic  Ext/BUS/vagina  Normal with menses type flow  Cervix  Normal with menses type flow. GC/Chlamydia done  Uterus  anteverted, normal size, shape and contour, midline and mobile nontender   Adnexa  Without masses or tenderness    Anus and perineum  Normal       Assessment/Plan:  23 y.o. 741P1001 female for annual exam regular menses, not currently sexually active.   1. Contraceptive management.  Patient previously had the IUD but had this removed due to breakthrough bleeding. Was placed on low dose oral contraceptives but had issues remembering this. Had been on Depo-Provera in the past but did not like the way she felt. I reviewed alternatives to include Ortho Evra, NuvaRing, nexplanon. Patient wants to try Ortho Evra as she has read about this. She is at the tail end of her menses now and will Sunday start this coming weekend. Backup contraception first cycle. Refill x1 year assuming she does well. 2. Genital HSV. Occasional outbreaks for which she used intermittent Valtrex. Appears to be having more outbreaks this past year and asked about daily suppressive therapy  reviewed with involved and ultimately she decided to do so and Valtrex 500 mg daily #30 refill x11 provided. 3. Pap smear 2013. No Pap smear done today. No history of abnormal Pap smears previously. Plan repeat Pap smear next year a 3 year interval. 4. Breast health. SBE monthly reviewed. 5. STD screening. No known exposure but requests STD screening. GC/chlamydia, hepatitis B. and C., RPR, HIV done. Continue use condoms regardless to help decrease STD risk. 6. Gardasil series. Patient never received. I gave her literature and encouraged her to consider starting the series. Patient states she will followup if she decides to do so. 7. Health maintenance. Baseline CBC done with above blood work. No urinalysis given menstrual period now.   Note: This document was prepared with digital dictation and possible smart phrase technology. Any transcriptional errors that result from this process are unintentional.   Dara LordsFONTAINE,Nasreen Goedecke P MD, 12:21 PM 03/21/2013

## 2013-03-22 LAB — RPR

## 2013-03-22 LAB — GC/CHLAMYDIA PROBE AMP
CT Probe RNA: NEGATIVE
GC Probe RNA: NEGATIVE

## 2013-03-22 LAB — HEPATITIS B SURFACE ANTIGEN: HEP B S AG: NEGATIVE

## 2013-03-22 LAB — HIV ANTIBODY (ROUTINE TESTING W REFLEX): HIV: NONREACTIVE

## 2013-03-22 LAB — HEPATITIS C ANTIBODY: HCV Ab: NEGATIVE

## 2013-08-19 DIAGNOSIS — F39 Unspecified mood [affective] disorder: Secondary | ICD-10-CM | POA: Insufficient documentation

## 2013-10-04 ENCOUNTER — Encounter: Payer: Self-pay | Admitting: Women's Health

## 2013-10-04 ENCOUNTER — Ambulatory Visit (INDEPENDENT_AMBULATORY_CARE_PROVIDER_SITE_OTHER): Payer: BC Managed Care – PPO | Admitting: Women's Health

## 2013-10-04 DIAGNOSIS — N898 Other specified noninflammatory disorders of vagina: Secondary | ICD-10-CM

## 2013-10-04 DIAGNOSIS — B9689 Other specified bacterial agents as the cause of diseases classified elsewhere: Secondary | ICD-10-CM

## 2013-10-04 DIAGNOSIS — Z30011 Encounter for initial prescription of contraceptive pills: Secondary | ICD-10-CM

## 2013-10-04 DIAGNOSIS — Z3009 Encounter for other general counseling and advice on contraception: Secondary | ICD-10-CM

## 2013-10-04 DIAGNOSIS — A499 Bacterial infection, unspecified: Secondary | ICD-10-CM

## 2013-10-04 DIAGNOSIS — N760 Acute vaginitis: Secondary | ICD-10-CM

## 2013-10-04 LAB — WET PREP FOR TRICH, YEAST, CLUE
Trich, Wet Prep: NONE SEEN
YEAST WET PREP: NONE SEEN

## 2013-10-04 MED ORDER — LEVONORGESTREL-ETHINYL ESTRAD 0.1-20 MG-MCG PO TABS: 1.0000 | ORAL_TABLET | Freq: Every day | ORAL | Status: DC

## 2013-10-04 MED ORDER — METRONIDAZOLE 0.75 % VA GEL: VAGINAL | Status: DC

## 2013-10-04 NOTE — Progress Notes (Signed)
Patient ID: Alisha Edwards, female   DOB: 20-Aug-1990, 23 y.o.   MRN: [id number] Presents with spotting for 2-3 days midcycle, minimal cramping. First time occurred. Using withdrawal or no contraception for the past 2 months. Same partner. Has had problems with migraines on birth control pills and patches. Did not like Mirena IUD. Denies urinary symptoms, abdominal pain or fever. Cycle due end of week, LMP normal, had a negative home U PT.  Exam: Appears well. External genitalia within normal limits, speculum exam no visible blood, erythema or irritation. Wet prep positive for clues, TNTC bacteria. Bimanual no CMT or adnexal fullness or tenderness.  Bacteria vaginosis 1 episode of midcycle spotting Contraception management  Plan: MetroGel vaginal cream 1 applicator at bedtime x5, alcohol precautions reviewed. Contraception options reviewed will try Alesse prescription, proper use given and reviewed slight risk for blood clots and strokes. Start with next cycle condoms first month and for infection control. Instructed to return to office if continued problems with spotting or unusual cycles.

## 2013-10-04 NOTE — Patient Instructions (Signed)
Bacterial Vaginosis Bacterial vaginosis is a vaginal infection that occurs when the normal balance of bacteria in the vagina is disrupted. It results from an overgrowth of certain bacteria. This is the most common vaginal infection in women of childbearing age. Treatment is important to prevent complications, especially in pregnant women, as it can cause a premature delivery. CAUSES  Bacterial vaginosis is caused by an increase in harmful bacteria that are normally present in smaller amounts in the vagina. Several different kinds of bacteria can cause bacterial vaginosis. However, the reason that the condition develops is not fully understood. RISK FACTORS Certain activities or behaviors can put you at an increased risk of developing bacterial vaginosis, including:  Having a new sex partner or multiple sex partners.  Douching.  Using an intrauterine device (IUD) for contraception. Women do not get bacterial vaginosis from toilet seats, bedding, swimming pools, or contact with objects around them. SIGNS AND SYMPTOMS  Some women with bacterial vaginosis have no signs or symptoms. Common symptoms include:  Grey vaginal discharge.  A fishlike odor with discharge, especially after sexual intercourse.  Itching or burning of the vagina and vulva.  Burning or pain with urination. DIAGNOSIS  Your health care provider will take a medical history and examine the vagina for signs of bacterial vaginosis. A sample of vaginal fluid may be taken. Your health care provider will look at this sample under a microscope to check for bacteria and abnormal cells. A vaginal pH test may also be done.  TREATMENT  Bacterial vaginosis may be treated with antibiotic medicines. These may be given in the form of a pill or a vaginal cream. A second round of antibiotics may be prescribed if the condition comes back after treatment.  HOME CARE INSTRUCTIONS   Only take over-the-counter or prescription medicines as  directed by your health care provider.  If antibiotic medicine was prescribed, take it as directed. Make sure you finish it even if you start to feel better.  Do not have sex until treatment is completed.  Tell all sexual partners that you have a vaginal infection. They should see their health care provider and be treated if they have problems, such as a mild rash or itching.  Practice safe sex by using condoms and only having one sex partner. SEEK MEDICAL CARE IF:   Your symptoms are not improving after 3 days of treatment.  You have increased discharge or pain.  You have a fever. MAKE SURE YOU:   Understand these instructions.  Will watch your condition.  Will get help right away if you are not doing well or get worse. FOR MORE INFORMATION  Centers for Disease Control and Prevention, Division of STD Prevention: www.cdc.gov/std American Sexual Health Association (ASHA): www.ashastd.org  Document Released: 01/27/2005 Document Revised: 11/17/2012 Document Reviewed: 09/08/2012 ExitCare Patient Information 2015 [employer]. This information is not intended to replace advice given to you by your health care provider. Make sure you discuss any questions you have with your health care provider.  

## 2013-10-19 ENCOUNTER — Telehealth: Payer: Self-pay

## 2013-10-19 NOTE — Telephone Encounter (Signed)
Patient said when she came in 8.25.15 that she was concerned she might be pregnancy because of midcycle spotting. At that point she had done home UPT and was neg.  She thought we did one when she was here because she left urine specimen but it does not appear so.  She waited for her cycle to start to be able to start OC's and when it did not start she started OC's anyway on 8.30.15.  Now she is c/o of breasts really tender and feeling like she is going to start.

## 2013-10-19 NOTE — Telephone Encounter (Signed)
Telephone call, without late for cycle at office visit, had a negative home U PT. Started pills without  cycle, will continue, call if no cycle at end of first pack, check home U PT now.

## 2013-12-12 ENCOUNTER — Encounter: Payer: Self-pay | Admitting: Women's Health

## 2014-03-27 ENCOUNTER — Encounter: Payer: BC Managed Care – PPO | Admitting: Gynecology

## 2014-04-14 ENCOUNTER — Other Ambulatory Visit: Payer: Self-pay | Admitting: Gynecology

## 2014-07-13 ENCOUNTER — Ambulatory Visit (INDEPENDENT_AMBULATORY_CARE_PROVIDER_SITE_OTHER): Payer: 59 | Admitting: Women's Health

## 2014-07-13 ENCOUNTER — Encounter: Payer: Self-pay | Admitting: Women's Health

## 2014-07-13 VITALS — BP 105/80 | Ht 64.0 in | Wt 131.0 lb

## 2014-07-13 DIAGNOSIS — B373 Candidiasis of vulva and vagina: Secondary | ICD-10-CM

## 2014-07-13 DIAGNOSIS — B009 Herpesviral infection, unspecified: Secondary | ICD-10-CM | POA: Diagnosis not present

## 2014-07-13 DIAGNOSIS — N898 Other specified noninflammatory disorders of vagina: Secondary | ICD-10-CM

## 2014-07-13 DIAGNOSIS — B3731 Acute candidiasis of vulva and vagina: Secondary | ICD-10-CM

## 2014-07-13 LAB — URINALYSIS W MICROSCOPIC + REFLEX CULTURE
BILIRUBIN URINE: NEGATIVE
Glucose, UA: NEGATIVE mg/dL
Hgb urine dipstick: NEGATIVE
Ketones, ur: NEGATIVE mg/dL
LEUKOCYTES UA: NEGATIVE
Nitrite: NEGATIVE
PROTEIN: NEGATIVE mg/dL
SPECIFIC GRAVITY, URINE: 1.02 (ref 1.005–1.030)
UROBILINOGEN UA: 1 mg/dL (ref 0.0–1.0)
pH: 7 (ref 5.0–8.0)

## 2014-07-13 LAB — WET PREP FOR TRICH, YEAST, CLUE
CLUE CELLS WET PREP: NONE SEEN
Trich, Wet Prep: NONE SEEN

## 2014-07-13 MED ORDER — FLUCONAZOLE 150 MG PO TABS: ORAL_TABLET | ORAL | Status: DC

## 2014-07-13 MED ORDER — FLUCONAZOLE 150 MG PO TABS
ORAL_TABLET | ORAL | Status: DC
Start: 2014-07-13 — End: 2015-04-12

## 2014-07-13 MED ORDER — VALACYCLOVIR HCL 500 MG PO TABS: 500.0000 mg | ORAL_TABLET | Freq: Every day | ORAL | Status: DC

## 2014-07-13 NOTE — Progress Notes (Signed)
Patient ID: Alisha Edwards, female   DOB: 04/08/1990, 24 y.o.   MRN: [id number] Presents with complaint of increased vaginal discharge with severe irritation and itching. Used over-the-counter cream with minimal relief. History of HSV no visible blisters. New partner. Contraception on Alesse. Denies abdominal pain, urinary symptoms or fever.  Exam: Appears well. External genitalia erythematous at introitus, speculum exam moderate amount of a curdy white discharge noted wet prep positive for yeast. Bimanual no CMT or adnexal fullness or tenderness. GC/Chlamydia culture taken. UA: Negative  Yeast vaginitis STD screen  Plan: Diflucan 150 by mouth today repeat in 3 days if needed. Prescription, proper use given and reviewed. Condoms encouraged until permanent partner. GC/Chlamydia culture pending, will check HIV, hepatitis and RPR at annual exam, instructed to schedule.

## 2014-07-13 NOTE — Addendum Note (Signed)
Addended by: Alisha ParkinsonBARNES, Alisha Edwards on: 07/13/2014 04:10 PM   Modules accepted: Orders

## 2014-07-13 NOTE — Patient Instructions (Signed)

## 2014-07-14 ENCOUNTER — Ambulatory Visit: Payer: Self-pay | Admitting: Women's Health

## 2014-07-14 LAB — GC/CHLAMYDIA PROBE AMP
CT PROBE, AMP APTIMA: NEGATIVE
GC PROBE AMP APTIMA: NEGATIVE

## 2014-08-07 ENCOUNTER — Telehealth: Payer: Self-pay | Admitting: *Deleted

## 2014-08-07 NOTE — Telephone Encounter (Signed)
Pt called c/o bleeding between cycles, last LMP: 1 week ago and now bleeding again. Pt said this has happened before, not on any birth control pill, is sexual active, no condoms as well. I advised pt to make appointment with provider.

## 2014-09-04 ENCOUNTER — Encounter: Payer: 59 | Admitting: Gynecology

## 2014-10-18 ENCOUNTER — Other Ambulatory Visit: Payer: Self-pay | Admitting: Women's Health

## 2014-10-25 ENCOUNTER — Telehealth: Payer: Self-pay | Admitting: *Deleted

## 2014-10-25 DIAGNOSIS — Z30011 Encounter for initial prescription of contraceptive pills: Secondary | ICD-10-CM

## 2014-10-25 MED ORDER — LEVONORGESTREL-ETHINYL ESTRAD 0.1-20 MG-MCG PO TABS: 1.0000 | ORAL_TABLET | Freq: Every day | ORAL | Status: DC

## 2014-10-25 NOTE — Telephone Encounter (Signed)
Patient has annual scheduled on 11/02/14, needs refill on Rx. Rx will be sent.

## 2014-11-02 ENCOUNTER — Encounter: Payer: 59 | Admitting: Gynecology

## 2014-11-20 ENCOUNTER — Telehealth: Payer: Self-pay | Admitting: *Deleted

## 2014-11-20 NOTE — Telephone Encounter (Signed)
Pt called upset about not having refill on her birth control pills, pt made several annual exams and cancelled all appointments. I explained to patient that annual needed yearly in order to have birth control pills, pt then hung the phone up.

## 2014-12-06 ENCOUNTER — Ambulatory Visit: Payer: 59 | Admitting: Women's Health

## 2015-04-12 ENCOUNTER — Encounter: Payer: Self-pay | Admitting: Women's Health

## 2015-04-12 ENCOUNTER — Other Ambulatory Visit: Payer: Self-pay | Admitting: Women's Health

## 2015-04-12 ENCOUNTER — Ambulatory Visit (INDEPENDENT_AMBULATORY_CARE_PROVIDER_SITE_OTHER): Payer: BLUE CROSS/BLUE SHIELD | Admitting: Women's Health

## 2015-04-12 VITALS — BP 102/68 | Ht 66.0 in | Wt 133.0 lb

## 2015-04-12 DIAGNOSIS — Z833 Family history of diabetes mellitus: Secondary | ICD-10-CM | POA: Diagnosis not present

## 2015-04-12 DIAGNOSIS — B009 Herpesviral infection, unspecified: Secondary | ICD-10-CM | POA: Diagnosis not present

## 2015-04-12 DIAGNOSIS — Z30011 Encounter for initial prescription of contraceptive pills: Secondary | ICD-10-CM | POA: Diagnosis not present

## 2015-04-12 DIAGNOSIS — B373 Candidiasis of vulva and vagina: Secondary | ICD-10-CM | POA: Diagnosis not present

## 2015-04-12 DIAGNOSIS — Z23 Encounter for immunization: Secondary | ICD-10-CM

## 2015-04-12 DIAGNOSIS — B3731 Acute candidiasis of vulva and vagina: Secondary | ICD-10-CM

## 2015-04-12 DIAGNOSIS — Z01419 Encounter for gynecological examination (general) (routine) without abnormal findings: Secondary | ICD-10-CM

## 2015-04-12 DIAGNOSIS — K640 First degree hemorrhoids: Secondary | ICD-10-CM

## 2015-04-12 LAB — CBC WITH DIFFERENTIAL/PLATELET
BASOS PCT: 1 % (ref 0–1)
Basophils Absolute: 0.1 10*3/uL (ref 0.0–0.1)
Eosinophils Absolute: 0.1 10*3/uL (ref 0.0–0.7)
Eosinophils Relative: 2 % (ref 0–5)
HEMATOCRIT: 39 % (ref 36.0–46.0)
HEMOGLOBIN: 13.5 g/dL (ref 12.0–15.0)
LYMPHS PCT: 42 % (ref 12–46)
Lymphs Abs: 2.5 10*3/uL (ref 0.7–4.0)
MCH: 32.2 pg (ref 26.0–34.0)
MCHC: 34.6 g/dL (ref 30.0–36.0)
MCV: 93.1 fL (ref 78.0–100.0)
MONO ABS: 0.5 10*3/uL (ref 0.1–1.0)
MONOS PCT: 9 % (ref 3–12)
MPV: 9.7 fL (ref 8.6–12.4)
NEUTROS ABS: 2.8 10*3/uL (ref 1.7–7.7)
NEUTROS PCT: 46 % (ref 43–77)
Platelets: 237 10*3/uL (ref 150–400)
RBC: 4.19 MIL/uL (ref 3.87–5.11)
RDW: 13.3 % (ref 11.5–15.5)
WBC: 6 10*3/uL (ref 4.0–10.5)

## 2015-04-12 LAB — WET PREP FOR TRICH, YEAST, CLUE
Clue Cells Wet Prep HPF POC: NONE SEEN
TRICH WET PREP: NONE SEEN

## 2015-04-12 MED ORDER — FLUCONAZOLE 100 MG PO TABS: 100.0000 mg | ORAL_TABLET | Freq: Every day | ORAL | Status: DC

## 2015-04-12 MED ORDER — LEVONORGESTREL-ETHINYL ESTRAD 0.1-20 MG-MCG PO TABS: 1.0000 | ORAL_TABLET | Freq: Every day | ORAL | Status: DC

## 2015-04-12 MED ORDER — VALACYCLOVIR HCL 500 MG PO TABS: 500.0000 mg | ORAL_TABLET | Freq: Every day | ORAL | Status: DC

## 2015-04-12 MED ORDER — HYDROCORTISONE ACE-PRAMOXINE 2.5-1 % RE CREA: 1.0000 "application " | TOPICAL_CREAM | Freq: Three times a day (TID) | RECTAL | Status: DC

## 2015-04-12 NOTE — Progress Notes (Signed)
Alisha Edwards [birth year] [id number]    History:    Presents for annual exam.  Monthly 7-8 day cycle, 3 days heavy flow, states when on pills cycle much lighter and shorter. Same partner negative STD screen. Has not received gardasil. Normal Pap history. History of HSV rare outbreaks. Has had problems with recurrent yeast. Was told in the past was prediabetic?  Past medical history, past surgical history, family history and social history were all reviewed and documented in the EPIC chart. 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 Alisha Edwards 4 doing well, partner is Alisha Edwards's father.   ROS:  A ROS was performed and pertinent positives and negatives are included.  Exam:  Filed Vitals:   04/12/15 1538  BP: 102/68    General appearance:  Normal Thyroid:  Symmetrical, normal in size, without palpable masses or nodularity. Respiratory  Auscultation:  Clear without wheezing or rhonchi Cardiovascular  Auscultation:  Regular rate, without rubs, murmurs or gallops  Edema/varicosities:  Not grossly evident Abdominal  Soft,nontender, without masses, guarding or rebound.  Liver/spleen:  No organomegaly noted  Hernia:  None appreciated  Skin  Inspection:  Grossly normal   Breasts: Examined lying and sitting.     Right: Without masses, retractions, discharge or axillary adenopathy.     Left: Without masses, retractions, discharge or axillary adenopathy. Gentitourinary   Inguinal/mons:  Normal without inguinal adenopathy  External genitalia:  Normal  BUS/Urethra/Skene's glands:  Normal  Vagina:  White discharge, wet prep positive for few yeast  Cervix:  Normal  Uterus:   normal in size, shape and contour.  Midline and mobile  Adnexa/parametria:     Rt: Without masses or tenderness.   Lt: Without masses or tenderness.  Anus and perineum: Normal  Digital rectal exam: Normal sphincter tone without palpated masses or  tenderness Small nonthrombosed hemorrhoid  Assessment/Plan:  25 y.o. SBF G 1 P1 for annual exam with complaint of cycles lasting 7-8 days, heavy flow, small painful hemorrhoid.  Contraception management Monthly cycle/menorrhagia Yeast vaginitis HSV Hemorrhoid  Plan: Contraception options reviewed would like to try pills again,  Aviane prescription, proper use, slight risk for blood clots and strokes reviewed. Start up instructions reviewed continue condoms until after first month. Instructed to call if cycles continue to be heavy and  last longer than 7 days. SBE's, regular exercise, calcium rich diet, MVI daily encouraged. Yeast prevention discussed, Diflucan  2 tablets today, 1 as needed, instructed to call if continued problems with recurrence. Analpram prescription, proper use when necessary. Valtrex 500 twice daily for 3-5 days when necessary prescription, proper use given and reviewed. CBC, hemoglobin A1c, UA, Pap, First gardasil given, instructed to return to office in 2 and 6 months to complete series.    Harrington Challenger [facility], 4:46 PM 04/12/2015

## 2015-04-12 NOTE — Patient Instructions (Signed)

## 2015-04-13 ENCOUNTER — Encounter: Payer: Self-pay | Admitting: Women's Health

## 2015-04-13 LAB — HEMOGLOBIN A1C
HEMOGLOBIN A1C: 5.5 % (ref <5.7)
MEAN PLASMA GLUCOSE: 111 mg/dL (ref <117)

## 2015-04-13 LAB — PAP IG W/ RFLX HPV ASCU

## 2015-04-17 ENCOUNTER — Telehealth: Payer: Self-pay | Admitting: *Deleted

## 2015-04-17 NOTE — Telephone Encounter (Signed)
Pharmacy faxed request for prior authorization for analpram 2.5%-1% cream medication [facility]

## 2015-06-04 ENCOUNTER — Ambulatory Visit: Payer: Self-pay

## 2015-07-05 ENCOUNTER — Telehealth: Payer: Self-pay | Admitting: *Deleted

## 2015-07-05 MED ORDER — FLUCONAZOLE 150 MG PO TABS: ORAL_TABLET | ORAL | Status: DC

## 2015-07-05 NOTE — Telephone Encounter (Signed)
Pt informed with the below note, Rx sent. 

## 2015-07-05 NOTE — Telephone Encounter (Signed)
(  Alisha Edwards patient) Pt has recurrent yeast infection requesting 3 diflucan tablets to help with relief, itching and slight white discharge, irritation. Pt will be leaving out of town for the holiday this weekend. Please advise

## 2015-07-05 NOTE — Telephone Encounter (Signed)
ok 

## 2015-07-16 ENCOUNTER — Encounter: Payer: Self-pay | Admitting: Gynecology

## 2015-07-16 ENCOUNTER — Telehealth: Payer: Self-pay | Admitting: *Deleted

## 2015-07-16 ENCOUNTER — Ambulatory Visit (INDEPENDENT_AMBULATORY_CARE_PROVIDER_SITE_OTHER): Payer: BLUE CROSS/BLUE SHIELD | Admitting: Gynecology

## 2015-07-16 VITALS — BP 118/66

## 2015-07-16 DIAGNOSIS — N920 Excessive and frequent menstruation with regular cycle: Secondary | ICD-10-CM | POA: Diagnosis not present

## 2015-07-16 LAB — CBC WITH DIFFERENTIAL/PLATELET
BASOS ABS: 53 {cells}/uL (ref 0–200)
Basophils Relative: 1 %
EOS ABS: 106 {cells}/uL (ref 15–500)
EOS PCT: 2 %
HCT: 38.6 % (ref 35.0–45.0)
Hemoglobin: 12.8 g/dL (ref 11.7–15.5)
LYMPHS PCT: 51 %
Lymphs Abs: 2703 cells/uL (ref 850–3900)
MCH: 30.9 pg (ref 27.0–33.0)
MCHC: 33.2 g/dL (ref 32.0–36.0)
MCV: 93.2 fL (ref 80.0–100.0)
MONOS PCT: 7 %
MPV: 9.6 fL (ref 7.5–12.5)
Monocytes Absolute: 371 cells/uL (ref 200–950)
NEUTROS PCT: 39 %
Neutro Abs: 2067 cells/uL (ref 1500–7800)
PLATELETS: 273 10*3/uL (ref 140–400)
RBC: 4.14 MIL/uL (ref 3.80–5.10)
RDW: 13.1 % (ref 11.0–15.0)
WBC: 5.3 10*3/uL (ref 3.8–10.8)

## 2015-07-16 MED ORDER — MEGESTROL ACETATE 20 MG PO TABS: 20.0000 mg | ORAL_TABLET | Freq: Two times a day (BID) | ORAL | Status: DC

## 2015-07-16 NOTE — Progress Notes (Signed)
    Alisha Edwards 04/23/1990 [id number]        25 y.o.  G1P1001 presents complaining of heavy menses starting yesterday. It started on oral contraceptives in March due to heavy menses and for contraception. This appears continue to be somewhat heavy but this cycle now she has passed clots with bleed through episode using both tampons and pads.  Past medical history,surgical history, problem list, medications, allergies, family history and social history were all reviewed and documented in the EPIC chart.  Directed ROS with pertinent positives and negatives documented in the history of present illness/assessment and plan.  Exam: Kennon PortelaKim Gardner assistant Filed Vitals:   07/16/15 1607  BP: 118/66   General appearance:  Normal Pelvic external BUS vagina with mild to moderate menstrual flow. Cervix normal. Uterus normal size midline mobile nontender. Adnexa without masses or tenderness.  Assessment/Plan:  25 y.o. G1P1001 with history of heavier menses. On birth control pills times several cycles. Notes heavy cycle this time although not bleeding heavily now. Will start on Megace 20 mg twice a day for 5 days. Restart her birth control pills Sunday as scheduled. Check baseline CBC hCG today. Check sonohysterogram to rule out structural abnormalities and check for leiomyoma.    Dara LordsFONTAINE,Alisha Enneking P MD, 4:35 PM 07/16/2015

## 2015-07-16 NOTE — Telephone Encounter (Signed)
FYI Pt called as walking in the office c/o heavy bleeding with cycle, wearing super tampon changing every hour, cycle started on 07/15/15. Pt said she is passing medium size clots, history of heavy periods, still taking birth control generic alesse as directed. Pt was out in the waiting room, pt will be an add on as she is changing super tampon every hour.

## 2015-07-16 NOTE — Patient Instructions (Signed)
Start the Megace medication twice daily for 5 days to stop her bleeding. Start back on your birth control pills this coming Sunday. Follow up for the ultrasound as scheduled.

## 2015-07-17 LAB — HCG, SERUM, QUALITATIVE: PREG SERUM: NEGATIVE

## 2015-07-25 ENCOUNTER — Telehealth: Payer: Self-pay | Admitting: *Deleted

## 2015-07-25 MED ORDER — MEGESTROL ACETATE 20 MG PO TABS: 20.0000 mg | ORAL_TABLET | Freq: Two times a day (BID) | ORAL | Status: DC

## 2015-07-25 NOTE — Telephone Encounter (Signed)
Pt informed and prescription sent to pharmacy. Pt will call back to schedule SHGM. KW CMA

## 2015-07-25 NOTE — Telephone Encounter (Signed)
Pt saw you last week for menorrhagia, you gave Megace 20mg  x 5 days she stopped bleeding on Sat 6/10 and began bleeding again yesterday and today heavier. Pt is scheduling SHGM. Requesting more Megace to get her to appointment. Can she schedule SHGM anytime since she is on OCP? Thanks Sherrilyn RistKari CMA

## 2015-07-25 NOTE — Telephone Encounter (Signed)
Megace 20 mg #30 one by mouth twice a day times several days then once daily through sonohysterogram. Sonohysterogram can be scheduled any time

## 2015-09-17 ENCOUNTER — Ambulatory Visit: Payer: Self-pay

## 2015-09-18 ENCOUNTER — Emergency Department (HOSPITAL_COMMUNITY)
Admission: EM | Admit: 2015-09-18 | Discharge: 2015-09-18 | Disposition: A | Discharge status: Home / Self Care | Payer: Self-pay

## 2015-09-18 ENCOUNTER — Encounter (HOSPITAL_BASED_OUTPATIENT_CLINIC_OR_DEPARTMENT_OTHER): Payer: Self-pay | Admitting: Emergency Medicine

## 2015-09-18 ENCOUNTER — Emergency Department (HOSPITAL_BASED_OUTPATIENT_CLINIC_OR_DEPARTMENT_OTHER)
Admission: EM | Admit: 2015-09-18 | Discharge: 2015-09-18 | Disposition: A | Discharge status: Home / Self Care | Payer: BLUE CROSS/BLUE SHIELD | Attending: Emergency Medicine | Admitting: Emergency Medicine

## 2015-09-18 DIAGNOSIS — W19XXXA Unspecified fall, initial encounter: Secondary | ICD-10-CM

## 2015-09-18 DIAGNOSIS — Y999 Unspecified external cause status: Secondary | ICD-10-CM | POA: Insufficient documentation

## 2015-09-18 DIAGNOSIS — S0181XA Laceration without foreign body of other part of head, initial encounter: Secondary | ICD-10-CM | POA: Diagnosis present

## 2015-09-18 DIAGNOSIS — Y939 Activity, unspecified: Secondary | ICD-10-CM | POA: Insufficient documentation

## 2015-09-18 DIAGNOSIS — Z23 Encounter for immunization: Secondary | ICD-10-CM | POA: Insufficient documentation

## 2015-09-18 DIAGNOSIS — Y929 Unspecified place or not applicable: Secondary | ICD-10-CM | POA: Diagnosis not present

## 2015-09-18 MED ORDER — TETANUS-DIPHTH-ACELL PERTUSSIS 5-2.5-18.5 LF-MCG/0.5 IM SUSP
0.5000 mL | Freq: Once | INTRAMUSCULAR | Status: AC
  Administered 2015-09-18: 0.5 mL via INTRAMUSCULAR
  Filled 2015-09-18: qty 0.5

## 2015-09-18 MED ORDER — LIDOCAINE-EPINEPHRINE (PF) 2 %-1:200000 IJ SOLN
10.0000 mL | Freq: Once | INTRAMUSCULAR | Status: AC
  Administered 2015-09-18: 10 mL
  Filled 2015-09-18: qty 10

## 2015-09-18 MED ORDER — IBUPROFEN 400 MG PO TABS
600.0000 mg | ORAL_TABLET | Freq: Once | ORAL | Status: AC
  Administered 2015-09-18: 600 mg via ORAL
  Filled 2015-09-18: qty 1

## 2015-09-18 NOTE — ED Notes (Signed)
PA at bedside for suture.
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
## 2015-09-18 NOTE — ED Notes (Signed)
Patient called in main ED waiting area with no response 

## 2015-09-18 NOTE — Discharge Instructions (Signed)
Remove the bandage after 24 hours, if applicable. You must wait at least 8 hours after the wound repair to wash the wound. Clean the wound and surrounding area gently with tap water and mild soap. Rinse well and blot dry. Do not scrub the wound, as this may cause the wound edges to come apart. You may shower, but avoid submerging the wound, such as with a bath or swimming. Clean the wound daily to prevent infection. Reapplication of a topical antibiotic ointment, such as Neosporin, will decrease scab formation and reduce any scarring. You may use Tylenol, naproxen, ibuprofen for pain.  You do not have to return for suture removal. They will dissolve.  Return to the ED sooner should the wound edges come apart or signs of infection arise, such as spreading redness, puffiness/swelling, pus draining from the wound, severe increase in pain, or any other major issues.

## 2015-09-18 NOTE — ED Triage Notes (Signed)
Called for triage x2

## 2015-09-18 NOTE — ED Notes (Signed)
Pt went to [facility] first and was checked in but she states the wait was too long and she left and came here.
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
## 2015-09-27 ENCOUNTER — Telehealth: Payer: Self-pay | Admitting: *Deleted

## 2015-09-27 NOTE — Telephone Encounter (Signed)
Pt called c/o recurrent yeast infection, states about 3 months ago you gave her a Rx for Diflucan #30 tablets and she moved and lost Rx. Pt asked if she could at least have #10 tablets. States she works and goes to school and unable to come every time she gets infections. Please advise

## 2015-09-27 NOTE — Telephone Encounter (Signed)
Okay, please E scribe Diflucan 100 mg take one to 2 when necessary every 3 days. #15

## 2015-09-28 MED ORDER — FLUCONAZOLE 100 MG PO TABS: ORAL_TABLET | ORAL | 0 refills | Status: DC

## 2015-09-28 NOTE — Telephone Encounter (Signed)
Pt informed, rx sent 

## 2015-10-21 ENCOUNTER — Emergency Department (HOSPITAL_BASED_OUTPATIENT_CLINIC_OR_DEPARTMENT_OTHER): Payer: BLUE CROSS/BLUE SHIELD

## 2015-10-21 ENCOUNTER — Encounter (HOSPITAL_BASED_OUTPATIENT_CLINIC_OR_DEPARTMENT_OTHER): Payer: Self-pay | Admitting: Emergency Medicine

## 2015-10-21 ENCOUNTER — Emergency Department (HOSPITAL_BASED_OUTPATIENT_CLINIC_OR_DEPARTMENT_OTHER)
Admission: EM | Admit: 2015-10-21 | Discharge: 2015-10-21 | Disposition: A | Discharge status: Home / Self Care | Payer: BLUE CROSS/BLUE SHIELD | Attending: Emergency Medicine | Admitting: Emergency Medicine

## 2015-10-21 DIAGNOSIS — I493 Ventricular premature depolarization: Secondary | ICD-10-CM | POA: Diagnosis not present

## 2015-10-21 DIAGNOSIS — R0602 Shortness of breath: Secondary | ICD-10-CM | POA: Diagnosis present

## 2015-10-21 NOTE — Discharge Instructions (Signed)
Follow up with your Physician for recheck next week.   Decrease caffeine consumption

## 2015-10-21 NOTE — ED Provider Notes (Signed)
[facility]   CSN: [id number] Arrival date & time: 10/21/15  1032     History   Chief Complaint Chief Complaint  Patient presents with  . Shortness of Breath    HPI Alisha Edwards is a 25 y.o. female.  Pt reports she feels her heart beat irregularly.Pt reports she feels like she has to take a deep breath when she feels heart miss a beat.  Pt reports she has had the same in the past and was told that monitoring was normal.  Pt wore a holter but developed a rash from glue.   The history is provided by the patient. No language interpreter was used.    Past Medical History:  Diagnosis Date  . HSV (herpes simplex virus) anogenital infection   . MRSA (methicillin resistant staph aureus) culture positive 2009   thigh    Patient Active Problem List   Diagnosis Date Noted  . Yeast vaginitis 04/12/2015    Past Surgical History:  Procedure Laterality Date  . MOUTH SURGERY    . nasal surgery      OB History    Gravida Para Term Preterm AB Living   1 1 1     1    SAB TAB Ectopic Multiple Live Births           1       Home Medications    Prior to Admission medications   Medication Sig Start Date End Date Taking? Authorizing Provider  Cholecalciferol (VITAMIN D) 400 UNIT/ML LIQD Take by mouth. Reported on 07/16/2015    Historical Provider, MD  fluconazole (DIFLUCAN) 100 MG tablet TAKE ONE TO TWO TABLETS AS NEEDED EVERY 3 DAYS 09/28/15   Harrington ChallengerNancy J Young, NP  levonorgestrel-ethinyl estradiol (AVIANE,ALESSE,LESSINA) 0.1-20 MG-MCG tablet Take 1 tablet by mouth daily. 04/12/15   Harrington ChallengerNancy J Young, NP  megestrol (MEGACE) 20 MG tablet Take 1 tablet (20 mg total) by mouth 2 (two) times daily. 07/25/15   Dara Lordsimothy P Fontaine, MD  valACYclovir (VALTREX) 500 MG tablet Take 1 tablet (500 mg total) by mouth daily. 04/12/15   Harrington ChallengerNancy J Young, NP    Family History Family History  Problem Relation Age of Onset  . Hypertension Maternal Grandmother   . Breast cancer  Maternal Grandmother     Age 25's  . Breast cancer Paternal Grandmother     Age 25'S    Social History Social History  Substance Use Topics  . Smoking status: Never Smoker  . Smokeless tobacco: Never Used  . Alcohol use 0.0 oz/week     Comment: rarely     Allergies   Shellfish allergy   Review of Systems Review of Systems  All other systems reviewed and are negative.    Physical Exam Updated Vital Signs BP 105/66 (BP Location: Left Arm)   Pulse 73   Temp 98.4 F (36.9 C) (Oral)   Resp 16   Ht 5\' 5"  (1.651 m)   Wt 63 kg   LMP 09/24/2015 (Exact Date)   SpO2 100%   BMI 23.13 kg/m   Physical Exam  Constitutional: She appears well-developed and well-nourished. No distress.  HENT:  Head: Normocephalic and atraumatic.  Eyes: Conjunctivae are normal.  Neck: Neck supple.  Cardiovascular: Normal rate and regular rhythm.   No murmur heard. Pulmonary/Chest: Effort normal and breath sounds normal. No respiratory distress.  Abdominal: Soft. There is no tenderness.  Musculoskeletal: She exhibits no edema.  Neurological: She is alert.  Skin: Skin is  warm and dry.  Psychiatric: She has a normal mood and affect.  Nursing note and vitals reviewed.    ED Treatments / Results  Labs (all labs ordered are listed, but only abnormal results are displayed) Labs Reviewed - No data to display  EKG  EKG Interpretation  Date/Time:  Sunday October 21 2015 10:45:52 EDT Ventricular Rate:  88 PR Interval:    QRS Duration: 84 QT Interval:  358 QTC Calculation: 434 R Axis:   22 Text Interpretation:  Sinus arrhythmia Multiple ventricular premature complexes Confirmed by Rubin Payor  MD, Harrold Donath [phone]) on 10/21/2015 11:01:10 AM Also confirmed by Rubin Payor  MD, NATHAN [phone]), editor Stout CT, Jola Babinski [phone])  on 10/21/2015 11:29:03 AM       Radiology Dg Chest 2 View  Result Date: 10/21/2015 CLINICAL DATA:  Irregular fast heartbeat, shortness of breath. Patient shielded EXAM:  CHEST  2 VIEW COMPARISON:  Report from prior radiograph 11/13/1998 FINDINGS: PA and lateral views chest demonstrate no acute infiltrate, consolidation or effusion. Cardiomediastinal silhouette within normal limits. No pneumothorax. Regional bones demonstrate no acute osseous abnormality. IMPRESSION: No active cardiopulmonary disease. Electronically Signed   By: Jasmine Pang M.D.   On: 10/21/2015 11:47    Procedures Procedures (including critical care time)  Medications Ordered in ED Medications - No data to display   Initial Impression / Assessment and Plan / ED Course  I have reviewed the triage vital signs and the nursing notes.  Pertinent labs & imaging results that were available during my care of the patient were reviewed by me and considered in my medical decision making (see chart for details).  Clinical Course    Pr counseled on PVC's.  I advised stop caffeine.   Final Clinical Impressions(s) / ED Diagnoses   Final diagnoses:  PVC's (premature ventricular contractions)    New Prescriptions New Prescriptions   No medications on file  An After Visit Summary was printed and given to the patient.   Lonia Skinner [place], PA-C 10/21/15 1648    Benjiman Core, MD 10/23/15 [phone]

## 2015-10-21 NOTE — ED Triage Notes (Signed)
Pt c/o heart palpatations and intermittent SOB for past few days.  Pt states h/o of similar "years" ago, at which time she saw a specialist and did not require any ongoing or corrective treatment.

## 2015-10-21 NOTE — ED Notes (Signed)
Pt able to ambulate and converse without difficulty or SOB.  Pt states she feels slight SOB with exertion, intermittently when she feels a "strange" heartbeat.

## 2016-02-16 ENCOUNTER — Encounter (HOSPITAL_BASED_OUTPATIENT_CLINIC_OR_DEPARTMENT_OTHER): Payer: Self-pay | Admitting: Emergency Medicine

## 2016-02-16 ENCOUNTER — Emergency Department (HOSPITAL_BASED_OUTPATIENT_CLINIC_OR_DEPARTMENT_OTHER)
Admission: EM | Admit: 2016-02-16 | Discharge: 2016-02-16 | Disposition: A | Discharge status: Home / Self Care | Payer: BLUE CROSS/BLUE SHIELD | Attending: Emergency Medicine | Admitting: Emergency Medicine

## 2016-02-16 ENCOUNTER — Emergency Department (HOSPITAL_BASED_OUTPATIENT_CLINIC_OR_DEPARTMENT_OTHER): Payer: BLUE CROSS/BLUE SHIELD

## 2016-02-16 DIAGNOSIS — O209 Hemorrhage in early pregnancy, unspecified: Secondary | ICD-10-CM | POA: Diagnosis present

## 2016-02-16 DIAGNOSIS — O200 Threatened abortion: Secondary | ICD-10-CM

## 2016-02-16 DIAGNOSIS — R102 Pelvic and perineal pain: Secondary | ICD-10-CM | POA: Diagnosis not present

## 2016-02-16 DIAGNOSIS — Z792 Long term (current) use of antibiotics: Secondary | ICD-10-CM | POA: Insufficient documentation

## 2016-02-16 DIAGNOSIS — Z3A01 Less than 8 weeks gestation of pregnancy: Secondary | ICD-10-CM | POA: Insufficient documentation

## 2016-02-16 DIAGNOSIS — N939 Abnormal uterine and vaginal bleeding, unspecified: Secondary | ICD-10-CM

## 2016-02-16 LAB — PREGNANCY, URINE: Preg Test, Ur: POSITIVE — AB

## 2016-02-16 LAB — URINALYSIS, ROUTINE W REFLEX MICROSCOPIC
Bilirubin Urine: NEGATIVE
Glucose, UA: NEGATIVE mg/dL
Ketones, ur: NEGATIVE mg/dL
LEUKOCYTES UA: NEGATIVE
Nitrite: NEGATIVE
Protein, ur: NEGATIVE mg/dL
SPECIFIC GRAVITY, URINE: 1.026 (ref 1.005–1.030)
pH: 7 (ref 5.0–8.0)

## 2016-02-16 LAB — WET PREP, GENITAL
SPERM: NONE SEEN
TRICH WET PREP: NONE SEEN
YEAST WET PREP: NONE SEEN

## 2016-02-16 LAB — URINALYSIS, MICROSCOPIC (REFLEX)

## 2016-02-16 LAB — HCG, QUANTITATIVE, PREGNANCY: hCG, Beta Chain, Quant, S: 54 m[IU]/mL — ABNORMAL HIGH (ref <5)

## 2016-02-16 NOTE — ED Provider Notes (Signed)
[facility] Provider Note   CSN: [id number] Arrival date & time: 02/16/16  1358   By signing my name below, I, Avnee Patel, attest that this documentation has been prepared under the direction and in the presence of Gwyneth SproutWhitney Martie Muhlbauer, MD  Electronically Signed: Clovis PuAvnee Patel, ED Scribe. 02/16/16. 4:22 PM.   History   Chief Complaint Chief Complaint  Patient presents with  . Pelvic Pain   The history is provided by the patient. No language interpreter was used.   HPI Comments:  Alisha Edwards is a 26 y.o. female who presents to the Emergency Department complaining of sudden onset, persistent pelvic pain and abdominal cramping x today. Pt also reports lower back pain and persistent vaginal bleeding. No alleviating factors noted. Pt states her abdominal discomfort and pelvic discomfort began 3 days ago only during intercourse with her same sexual partner of 2 years. She states she felt the discomfort again only during intercourse 2 days ago and moderate pain yesterday only with intercourse. Pt denies dysuria, sexual intercourse in the AM, rough intercourse, an IUD, hx of STDs, hx of pain with sexual intercourse in the past, any other sexual partners, protection use during intercourse, any other associated symptoms and any other modifying factors at this time. LMP on 02/03/16. G1P1A0.     Past Medical History:  Diagnosis Date  . HSV (herpes simplex virus) anogenital infection   . MRSA (methicillin resistant staph aureus) culture positive 2009   thigh    Patient Active Problem List   Diagnosis Date Noted  . Yeast vaginitis 04/12/2015    Past Surgical History:  Procedure Laterality Date  . MOUTH SURGERY    . nasal surgery      OB History    Gravida Para Term Preterm AB Living   1 1 1     1    SAB TAB Ectopic Multiple Live Births           1       Home Medications    Prior to Admission medications   Medication Sig Start Date End Date Taking? Authorizing  Provider  valACYclovir (VALTREX) 500 MG tablet Take 1 tablet (500 mg total) by mouth daily. 04/12/15  Yes Harrington ChallengerNancy J Young, NP  Cholecalciferol (VITAMIN D) 400 UNIT/ML LIQD Take by mouth. Reported on 07/16/2015    Historical Provider, MD  fluconazole (DIFLUCAN) 100 MG tablet TAKE ONE TO TWO TABLETS AS NEEDED EVERY 3 DAYS 09/28/15   Harrington ChallengerNancy J Young, NP  levonorgestrel-ethinyl estradiol (AVIANE,ALESSE,LESSINA) 0.1-20 MG-MCG tablet Take 1 tablet by mouth daily. 04/12/15   Harrington ChallengerNancy J Young, NP  megestrol (MEGACE) 20 MG tablet Take 1 tablet (20 mg total) by mouth 2 (two) times daily. 07/25/15   Dara Lordsimothy P Fontaine, MD    Family History Family History  Problem Relation Age of Onset  . Hypertension Maternal Grandmother   . Breast cancer Maternal Grandmother     Age 26's  . Breast cancer Paternal Grandmother     Age 26'S    Social History Social History  Substance Use Topics  . Smoking status: Never Smoker  . Smokeless tobacco: Never Used  . Alcohol use 0.0 oz/week     Comment: rarely     Allergies   Shellfish allergy   Review of Systems Review of Systems  Gastrointestinal: Positive for abdominal pain.  Genitourinary: Positive for pelvic pain and vaginal bleeding. Negative for dysuria and vaginal discharge.  All other systems reviewed and are negative.  Physical Exam Updated Vital  Signs BP 110/69 (BP Location: Right Arm)   Pulse 72   Temp 98 F (36.7 C) (Oral)   Resp 16   Ht 5\' 5"  (1.651 m)   Wt 125 lb (56.7 kg)   LMP 02/07/2016   SpO2 100%   BMI 20.80 kg/m   Physical Exam  Constitutional: She is oriented to person, place, and time. She appears well-developed and well-nourished. No distress.  HENT:  Head: Normocephalic and atraumatic.  Eyes: Conjunctivae are normal.  Cardiovascular: Normal rate.   Pulmonary/Chest: Effort normal.  Abdominal: She exhibits no distension. There is tenderness.  Minimal suprapubic tenderness.   Genitourinary:  Genitourinary Comments: Mild left adnexal  pain without fullness. Minimal blood in the vaginal vault. No vaginal lacerations or lesions.   Neurological: She is alert and oriented to person, place, and time.  Skin: Skin is warm and dry.  Psychiatric: She has a normal mood and affect.  Nursing note and vitals reviewed.  ED Treatments / Results  DIAGNOSTIC STUDIES:  Oxygen Saturation is 100% on RA, normal by my interpretation.    COORDINATION OF CARE:  4:12 PM Discussed treatment plan with pt at bedside and pt agreed to plan.  Labs (all labs ordered are listed, but only abnormal results are displayed) Labs Reviewed  WET PREP, GENITAL - Abnormal; Notable for the following:       Result Value   Clue Cells Wet Prep HPF POC PRESENT (*)    WBC, Wet Prep HPF POC FEW (*)    All other components within normal limits  PREGNANCY, URINE - Abnormal; Notable for the following:    Preg Test, Ur POSITIVE (*)    All other components within normal limits  URINALYSIS, ROUTINE W REFLEX MICROSCOPIC - Abnormal; Notable for the following:    Hgb urine dipstick SMALL (*)    All other components within normal limits  HCG, QUANTITATIVE, PREGNANCY - Abnormal; Notable for the following:    hCG, Beta Chain, Quant, S 54 (*)    All other components within normal limits  URINALYSIS, MICROSCOPIC (REFLEX) - Abnormal; Notable for the following:    Bacteria, UA MANY (*)    Squamous Epithelial / LPF 0-5 (*)    All other components within normal limits  GC/CHLAMYDIA PROBE AMP ([place]) NOT AT [facility]    EKG  EKG Interpretation None       Radiology US Ob Comp < 14 Wks  Result Date: 02/16/2016 CLINICAL DATA:  26 year old female pregnant patient with heavy vaginal bleeding since this morning. Diffuse dull cramping for the past 4 days. EXAM: OBSTETRIC <14 WK [place] AND TRANSVAGINAL OB US TECHNIQUE: Both transabdominal and transvaginal ultrasound examinations were performed for complete evaluation of the gestation as well as the maternal uterus, adnexal  regions, and pelvic cul-de-sac. Transvaginal technique was performed to assess early pregnancy. COMPARISON:  Pelvic ultrasound 06/19/2010. FINDINGS: Intrauterine gestational sac: None Yolk sac:  Not Visualized. Embryo:  Not Visualized. Cardiac Activity: N/A Heart Rate: N/A Subchorionic hemorrhage:  N/A Maternal uterus/adnexae: Uterus and ovaries are normal in appearance. No free fluid in the cul-de-sac. IMPRESSION: 1. No IUP noted.  No acute findings. Electronically Signed   By: Trudie Reed M.D.   On: 02/16/2016 17:14   US Ob Transvaginal  Result Date: 02/16/2016 CLINICAL DATA:  26 year old female pregnant patient with heavy vaginal bleeding since this morning. Diffuse dull cramping for the past 4 days. EXAM: OBSTETRIC <14 WK [place] AND TRANSVAGINAL OB US TECHNIQUE: Both transabdominal and transvaginal ultrasound examinations were  performed for complete evaluation of the gestation as well as the maternal uterus, adnexal regions, and pelvic cul-de-sac. Transvaginal technique was performed to assess early pregnancy. COMPARISON:  Pelvic ultrasound 06/19/2010. FINDINGS: Intrauterine gestational sac: None Yolk sac:  Not Visualized. Embryo:  Not Visualized. Cardiac Activity: N/A Heart Rate: N/A Subchorionic hemorrhage:  N/A Maternal uterus/adnexae: Uterus and ovaries are normal in appearance. No free fluid in the cul-de-sac. IMPRESSION: 1. No IUP noted.  No acute findings. Electronically Signed   By: Trudie Reed M.D.   On: 02/16/2016 17:14    Procedures Procedures (including critical care time)  Medications Ordered in ED Medications - No data to display   Initial Impression / Assessment and Plan / ED Course  I have reviewed the triage vital signs and the nursing notes.  Pertinent labs & imaging results that were available during my care of the patient were reviewed by me and considered in my medical decision making (see chart for details).  Clinical Course     Patient is a healthy 26 year old  female presenting today with pelvic discomfort and vaginal bleeding. Patient has had intermittent pelvic discomfort with sex over the last 4 days but today woke up with cramping and dark blood. She denies any nausea or vomiting. She has not had aggressive sex or any new partners. She states typically she would not have pain like she had over the last few days. Her LMP was in December and she has not missed any periods. She does not take any birth control and has unprotected sex. She denies any discharge or infectious symptoms. On exam patient has mild left adnexal pain and dark blood in the vaginal vault coming from the cervical os. No evidence of lacerations or lesions. Urine pregnancy test is positive. Wet prep with clue cells and few white blood cells. UA within normal limits. HCG pending and pelvic ultrasound pending to rule out ectopic pregnancy.  5:24 PM HCG is low at 54.  [place] without acute findings.  Feel early pregnancy with threatened miscarriage.  Will have pt f/u with OB Final Clinical Impressions(s) / ED Diagnoses   Final diagnoses:  Vaginal bleeding  Threatened miscarriage in early pregnancy    New Prescriptions New Prescriptions   No medications on file   I personally performed the services described in this documentation, which was scribed in my presence.  The recorded information has been reviewed and considered.     Gwyneth Sprout, MD 02/16/16 1736

## 2016-02-16 NOTE — ED Triage Notes (Signed)
Pt reports pelvic pain during sex for 4 days, noticed vaginal bleeding last night after intercourse. Today reports abd cramping.

## 2016-02-16 NOTE — ED Notes (Signed)
Pelvic cart at bedside. 

## 2016-02-18 LAB — GC/CHLAMYDIA PROBE AMP (~~LOC~~) NOT AT ARMC
CHLAMYDIA, DNA PROBE: POSITIVE — AB
NEISSERIA GONORRHEA: NEGATIVE

## 2016-02-20 ENCOUNTER — Inpatient Hospital Stay (HOSPITAL_COMMUNITY)
Admission: AD | Admit: 2016-02-20 | Discharge: 2016-02-20 | Disposition: A | Discharge status: Home / Self Care | Payer: BLUE CROSS/BLUE SHIELD | Source: Ambulatory Visit | Attending: Obstetrics and Gynecology | Admitting: Obstetrics and Gynecology

## 2016-02-20 ENCOUNTER — Encounter (HOSPITAL_COMMUNITY): Payer: Self-pay | Admitting: *Deleted

## 2016-02-20 DIAGNOSIS — O98819 Other maternal infectious and parasitic diseases complicating pregnancy, unspecified trimester: Secondary | ICD-10-CM

## 2016-02-20 DIAGNOSIS — A749 Chlamydial infection, unspecified: Secondary | ICD-10-CM

## 2016-02-20 DIAGNOSIS — Z3A01 Less than 8 weeks gestation of pregnancy: Secondary | ICD-10-CM | POA: Diagnosis not present

## 2016-02-20 DIAGNOSIS — O039 Complete or unspecified spontaneous abortion without complication: Secondary | ICD-10-CM

## 2016-02-20 DIAGNOSIS — O26891 Other specified pregnancy related conditions, first trimester: Secondary | ICD-10-CM | POA: Diagnosis not present

## 2016-02-20 DIAGNOSIS — R109 Unspecified abdominal pain: Secondary | ICD-10-CM | POA: Insufficient documentation

## 2016-02-20 LAB — CBC
HEMATOCRIT: 37.5 % (ref 36.0–46.0)
HEMOGLOBIN: 12.6 g/dL (ref 12.0–15.0)
MCH: 31 pg (ref 26.0–34.0)
MCHC: 33.6 g/dL (ref 30.0–36.0)
MCV: 92.4 fL (ref 78.0–100.0)
Platelets: 244 10*3/uL (ref 150–400)
RBC: 4.06 MIL/uL (ref 3.87–5.11)
RDW: 12.8 % (ref 11.5–15.5)
WBC: 4.8 10*3/uL (ref 4.0–10.5)

## 2016-02-20 LAB — HCG, QUANTITATIVE, PREGNANCY: hCG, Beta Chain, Quant, S: 7 m[IU]/mL — ABNORMAL HIGH (ref <5)

## 2016-02-20 MED ORDER — AZITHROMYCIN 250 MG PO TABS
1000.0000 mg | ORAL_TABLET | Freq: Once | ORAL | Status: AC
  Administered 2016-02-20: 1000 mg via ORAL
  Filled 2016-02-20: qty 4

## 2016-02-20 NOTE — MAU Provider Note (Signed)
History    First Provider Initiated Contact with Patient 02/20/16 2059      Chief Complaint:  Abdominal Pain pregnancy   Alisha Edwards is  26 y.o. G2P1001 Patient's last menstrual period was 02/07/2016.Marland Kitchen. Patient is here for follow up of quantitative HCG and ongoing surveillance of pregnancy status.   She is 7229w6d weeks gestation  by LMP.    Since her last visit, the patient is without new complaint.     ROS Abdomin Pain: Pos mid abd pain (level of umbilicus) Vaginal bleeding: none now.   Passage of clots or tissue: None Dizziness: None  Her previous Quantitative HCG values are:  Results for Alisha LoseSIMMONS, Lakima D (MRN [id number]) as of 02/21/2016 04:37  Ref. Range 02/16/2016 16:25  HCG, Beta Chain, Quant, S Latest Ref Range: <5 mIU/mL 54 (H)    Physical Exam   BP 109/74 (BP Location: Right Arm)   Pulse 64   Temp 98.1 F (36.7 C) (Oral)   Resp 18   Ht 5\' 5"  (1.651 m)   Wt 124 lb (56.2 kg)   LMP 02/07/2016   SpO2 100%   BMI 20.63 kg/m  Constitutional: Well-nourished female in no apparent distress. No pallor Neuro: Alert and oriented 4 Cardiovascular: Normal rate Respiratory: Normal effort and rate Abdomen: Soft, nontender Gynecological Exam: examination not indicated  Labs: Results for orders placed or performed during the hospital encounter of 02/20/16 (from the past 24 hour(s))  CBC   Collection Time: 02/20/16  8:26 PM  Result Value Ref Range   WBC 4.8 4.0 - 10.5 K/uL   RBC 4.06 3.87 - 5.11 MIL/uL   Hemoglobin 12.6 12.0 - 15.0 g/dL   HCT 40.137.5 02.736.0 - 25.346.0 %   MCV 92.4 78.0 - 100.0 fL   MCH 31.0 26.0 - 34.0 pg   MCHC 33.6 30.0 - 36.0 g/dL   RDW 66.412.8 40.311.5 - 47.415.5 %   Platelets 244 150 - 400 K/uL  hCG, quantitative, pregnancy   Collection Time: 02/20/16  8:26 PM  Result Value Ref Range   hCG, Beta Chain, Quant, S 7 (H) <5 mIU/mL    Ultrasound Studies:   [place] Ob Comp < 14 Wks  Result Date: 02/16/2016 CLINICAL DATA:  26 year old female pregnant patient with heavy  vaginal bleeding since this morning. Diffuse dull cramping for the past 4 days. EXAM: OBSTETRIC <14 WK US AND TRANSVAGINAL OB US TECHNIQUE: Both transabdominal and transvaginal ultrasound examinations were performed for complete evaluation of the gestation as well as the maternal uterus, adnexal regions, and pelvic cul-de-sac. Transvaginal technique was performed to assess early pregnancy. COMPARISON:  Pelvic ultrasound 06/19/2010. FINDINGS: Intrauterine gestational sac: None Yolk sac:  Not Visualized. Embryo:  Not Visualized. Cardiac Activity: N/A Heart Rate: N/A Subchorionic hemorrhage:  N/A Maternal uterus/adnexae: Uterus and ovaries are normal in appearance. No free fluid in the cul-de-sac. IMPRESSION: 1. No IUP noted.  No acute findings. Electronically Signed   By: Trudie Reedaniel  Entrikin M.D.   On: 02/16/2016 17:14   [place] Ob Transvaginal  Result Date: 02/16/2016 CLINICAL DATA:  26 year old female pregnant patient with heavy vaginal bleeding since this morning. Diffuse dull cramping for the past 4 days. EXAM: OBSTETRIC <14 WK US AND TRANSVAGINAL OB US TECHNIQUE: Both transabdominal and transvaginal ultrasound examinations were performed for complete evaluation of the gestation as well as the maternal uterus, adnexal regions, and pelvic cul-de-sac. Transvaginal technique was performed to assess early pregnancy. COMPARISON:  Pelvic ultrasound 06/19/2010. FINDINGS: Intrauterine gestational sac: None Yolk sac:  Not Visualized. Embryo:  Not Visualized. Cardiac Activity: N/A Heart Rate: N/A Subchorionic hemorrhage:  N/A Maternal uterus/adnexae: Uterus and ovaries are normal in appearance. No free fluid in the cul-de-sac. IMPRESSION: 1. No IUP noted.  No acute findings. Electronically Signed   By: Trudie Reed M.D.   On: 02/16/2016 17:14    MAU course/MDM: Quantitative hCG ordered  - Informed pt of Chlamydia Dx. Tx Azithromycin in MAU. EPT offered. Left w/out Rx. In-basket massage sent to Dr. Audie Box to see if he  would offer it at appt tomorrow.  - Pain and bleeding in early pregnancy with drop in Quant, but hemodynamically stable. Dx SAB.   Assessment: Miscarriage Chlamydia  Plan: Discharge home in stable condition. SAB precautions Support given. No IC x 1 week after both pt and partner have been Tx'd.  Follow-up Information    Dara Lords, MD Follow up in 1 day(s).   Specialties:  Gynecology, Radiology Why:  as scheduled  Contact information: [address] [place] [address] [phone]        [facility] MATERNITY ADMISSIONS Follow up.   Why:  as needed in emergencies Contact information: [address] [id number] mc [place] [address] [phone]         Allergies as of 02/20/2016      Reactions   Shellfish Allergy Itching, Swelling   Mouth swells and itches      Medication List    STOP taking these medications   fluconazole 100 MG tablet Commonly known as:  DIFLUCAN   levonorgestrel-ethinyl estradiol 0.1-20 MG-MCG tablet Commonly known as:  AVIANE,ALESSE,LESSINA   megestrol 20 MG tablet Commonly known as:  MEGACE   valACYclovir 500 MG tablet Commonly known as:  Joan Mayans, CNM 02/20/2016, 10:01 PM  2/3

## 2016-02-20 NOTE — Discharge Instructions (Signed)
Miscarriage A miscarriage is the sudden loss of an unborn baby (fetus) before the 20th week of pregnancy. Most miscarriages happen in the first 3 months of pregnancy. Sometimes, it happens before a woman even knows she is pregnant. A miscarriage is also called a "spontaneous miscarriage" or "early pregnancy loss." Having a miscarriage can be an emotional experience. Talk with your caregiver about any questions you may have about miscarrying, the grieving process, and your future pregnancy plans. What are the causes?  Problems with the fetal chromosomes that make it impossible for the baby to develop normally. Problems with the baby's genes or chromosomes are most often the result of errors that occur, by chance, as the embryo divides and grows. The problems are not inherited from the parents.  Infection of the cervix or uterus.  Hormone problems.  Problems with the cervix, such as having an incompetent cervix. This is when the tissue in the cervix is not strong enough to hold the pregnancy.  Problems with the uterus, such as an abnormally shaped uterus, uterine fibroids, or congenital abnormalities.  Certain medical conditions.  Smoking, drinking alcohol, or taking illegal drugs.  Trauma. Often, the cause of a miscarriage is unknown. What are the signs or symptoms?  Vaginal bleeding or spotting, with or without cramps or pain.  Pain or cramping in the abdomen or lower back.  Passing fluid, tissue, or blood clots from the vagina. How is this diagnosed? Your caregiver will perform a physical exam. You may also have an ultrasound to confirm the miscarriage. Blood or urine tests may also be ordered. How is this treated?  Sometimes, treatment is not necessary if you naturally pass all the fetal tissue that was in the uterus. If some of the fetus or placenta remains in the body (incomplete miscarriage), tissue left behind may become infected and must be removed. Usually, a dilation and  curettage (D and C) procedure is performed. During a D and C procedure, the cervix is widened (dilated) and any remaining fetal or placental tissue is gently removed from the uterus.  Antibiotic medicines are prescribed if there is an infection. Other medicines may be given to reduce the size of the uterus (contract) if there is a lot of bleeding.  If you have Rh negative blood and your baby was Rh positive, you will need a Rh immunoglobulin shot. This shot will protect any future baby from having Rh blood problems in future pregnancies. Follow these instructions at home:  Your caregiver may order bed rest or may allow you to continue light activity. Resume activity as directed by your caregiver.  Have someone help with home and family responsibilities during this time.  Keep track of the number of sanitary pads you use each day and how soaked (saturated) they are. Write down this information.  Do not use tampons. Do not douche or have sexual intercourse until approved by your caregiver.  Only take over-the-counter or prescription medicines for pain or discomfort as directed by your caregiver.  Do not take aspirin. Aspirin can cause bleeding.  Keep all follow-up appointments with your caregiver.  If you or your partner have problems with grieving, talk to your caregiver or seek counseling to help cope with the pregnancy loss. Allow enough time to grieve before trying to get pregnant again. Get help right away if:  You have severe cramps or pain in your back or abdomen.  You have a fever.  You pass large blood clots (walnut-sized or larger) ortissue from your  vagina. Save any tissue for your caregiver to inspect.  Your bleeding increases.  You have a thick, bad-smelling vaginal discharge.  You become lightheaded, weak, or you faint.  You have chills. This information is not intended to replace advice given to you by your health care provider. Make sure you discuss any questions  you have with your health care provider. Document Released: 07/23/2000 Document Revised: 07/05/2015 Document Reviewed: 03/18/2011 Elsevier Interactive Patient Education  2017 [employer] Inc[employer]   Chlamydia, Female Chlamydia is an infection. It is spread through sexual contact. Chlamydia can be in different areas of the body. These areas include the cervix, urethra, throat, or rectum. You may not know you have chlamydia because many people never develop the symptoms. Chlamydia is not difficult to treat once you know you have it. However, if it is left untreated, chlamydia can lead to more serious health problems.  CAUSES  Chlamydia is caused by bacteria. It is a sexually transmitted disease. It is passed from an infected partner during intimate contact. This contact could be with the genitals, mouth, or rectal area. Chlamydia can also be passed from mothers to babies during birth. SIGNS AND SYMPTOMS  There may not be any symptoms. This is often the case early in the infection. If symptoms develop, they may include:  Mild pain and discomfort when urinating.  Redness, soreness, and swelling (inflammation) of the rectum.  Vaginal discharge.  Painful intercourse.  Abdominal pain.  Bleeding between menstrual periods. DIAGNOSIS  To diagnose this infection, your health care provider will do a pelvic exam. Cultures will be taken of the vagina, cervix, urine, and possibly the rectum to verify the diagnosis.  TREATMENT You will be given antibiotic medicines. If you are pregnant, certain types of antibiotics will need to be avoided. Any sexual partners should also be treated, even if they do not show symptoms. Your health care provider may test you for infection again 3 months after treatment. HOME CARE INSTRUCTIONS   Take your antibiotic medicine as directed by your health care provider. Finish the antibiotic even if you start to feel better.  Take medicines only as directed by your health care  provider.  Inform any sexual partners about the infection. They should also be treated.  Do not have sexual contact until your health care provider tells you it is okay.  Get plenty of rest.  Eat a well-balanced diet.  Drink enough fluids to keep your urine clear or pale yellow.  Keep all follow-up visits as directed by your health care provider. SEEK MEDICAL CARE IF:  You have painful urination.  You have abdominal pain.  You have vaginal discharge.  You have painful sexual intercourse.  You have bleeding between periods and after sex.  You have a fever. SEEK IMMEDIATE MEDICAL CARE IF:   You experience nausea or vomiting.  You experience excessive sweating (diaphoresis).  You have difficulty swallowing. This information is not intended to replace advice given to you by your health care provider. Make sure you discuss any questions you have with your health care provider. Document Released: 11/06/2004 Document Revised: 10/18/2014 Document Reviewed: 10/04/2012 Elsevier Interactive Patient Education  2017 [employer].

## 2016-02-20 NOTE — MAU Note (Signed)
Pt reports she [facility]. Was having cramping and bleeding then, the bleeding continued until yesterday and today has very little bleeding. Cramping worsened today.

## 2016-02-21 ENCOUNTER — Telehealth: Payer: Self-pay | Admitting: *Deleted

## 2016-02-21 ENCOUNTER — Ambulatory Visit: Payer: BLUE CROSS/BLUE SHIELD | Admitting: Gynecology

## 2016-02-21 NOTE — Telephone Encounter (Signed)
Looking at her blood values that looks like she had a spontaneous miscarriage. Assuming she resumes regular menses then I do not need to see her. The question is whether she should get Rhogam if she would be Rh-.. The values were very low and there are some argument against getting a shot with these low values. If she did get the shot it should be like today or tomorrow. Does she know what her blood type is ?  If unknown then she could come by today and have her blood type checked and if Rh- then get the RhoGAM tomorrow.  She also needs to make sure she takes the antibiotics for the chlamydia and that any partners need to be notified so they can be treated also

## 2016-02-21 NOTE — Telephone Encounter (Signed)
Yes

## 2016-02-21 NOTE — Telephone Encounter (Signed)
Left message for pt to call.   It appears she is A+ per 01/24/11 lab, I will relay " Assuming she resumes regular menses then I do not need to see her." and taking the antibiotics for the chlamydia and that any partners need to be notified so they can be treated also

## 2016-02-21 NOTE — Telephone Encounter (Signed)
Pt had appointment today cancelled had miscarriage [facility]. Does patient need any follow up with you? Please advise

## 2016-02-22 NOTE — Telephone Encounter (Signed)
Pt informed with the below note, she has Rx for chlamydia

## 2016-02-22 NOTE — Telephone Encounter (Signed)
Left message for pt to call again. 

## 2016-05-16 ENCOUNTER — Other Ambulatory Visit: Payer: Self-pay | Admitting: Women's Health

## 2016-05-16 DIAGNOSIS — Z30011 Encounter for initial prescription of contraceptive pills: Secondary | ICD-10-CM

## 2016-05-20 ENCOUNTER — Encounter: Payer: Self-pay | Admitting: Gynecology

## 2016-05-20 ENCOUNTER — Ambulatory Visit (INDEPENDENT_AMBULATORY_CARE_PROVIDER_SITE_OTHER): Payer: BLUE CROSS/BLUE SHIELD | Admitting: Gynecology

## 2016-05-20 VITALS — BP 112/64 | Ht 66.0 in | Wt 123.0 lb

## 2016-05-20 DIAGNOSIS — Z113 Encounter for screening for infections with a predominantly sexual mode of transmission: Secondary | ICD-10-CM | POA: Diagnosis not present

## 2016-05-20 DIAGNOSIS — Z3009 Encounter for other general counseling and advice on contraception: Secondary | ICD-10-CM

## 2016-05-20 DIAGNOSIS — N926 Irregular menstruation, unspecified: Secondary | ICD-10-CM

## 2016-05-20 DIAGNOSIS — Z01419 Encounter for gynecological examination (general) (routine) without abnormal findings: Secondary | ICD-10-CM | POA: Diagnosis not present

## 2016-05-20 LAB — HEPATITIS C ANTIBODY: HCV Ab: NEGATIVE

## 2016-05-20 LAB — HIV ANTIBODY (ROUTINE TESTING W REFLEX): HIV 1&2 Ab, 4th Generation: NONREACTIVE

## 2016-05-20 LAB — HEPATITIS B SURFACE ANTIGEN: HEP B S AG: NEGATIVE

## 2016-05-20 LAB — HCG, SERUM, QUALITATIVE: PREG SERUM: NEGATIVE

## 2016-05-20 MED ORDER — NORETHINDRONE ACET-ETHINYL EST 1-20 MG-MCG PO TABS: 1.0000 | ORAL_TABLET | Freq: Every day | ORAL | 11 refills | Status: DC

## 2016-05-20 NOTE — Progress Notes (Signed)
    Alisha Edwards Jul 19, 1990 [id number]        26 y.o.  G2P1011 for annual exam.  Recently had spontaneous AB in January and was found to have a positive chlamydia for which she was treated for. Currently not using birth control. Wants to discuss her contraceptive options.  Past medical history,surgical history, problem list, medications, allergies, family history and social history were all reviewed and documented as reviewed in the EPIC chart.  ROS:  Performed with pertinent positives and negatives included in the history, assessment and plan.   Additional significant findings :  None   Exam: Kennon Portela assistant Vitals:   05/20/16 0842  BP: 112/64  Weight: 123 lb (55.8 kg)  Height:  (1.676 m)   Body mass index is 19.85 kg/m.  General appearance:  Normal affect, orientation and appearance. Skin: Grossly normal HEENT: Without gross lesions.  No cervical or supraclavicular adenopathy. Thyroid normal.  Lungs:  Clear without wheezing, rales or rhonchi Cardiac: RR, without RMG Abdominal:  Soft, nontender, without masses, guarding, rebound, organomegaly or hernia Breasts:  Examined lying and sitting without masses, retractions, discharge or axillary adenopathy. Pelvic:  Ext, BUS, Vagina: With light menses flow  Cervix: With light menses flow. GC/Chlamydia  Uterus: Anteverted, normal size, shape and contour, midline and mobile nontender   Adnexa: Without masses or tenderness    Anus and perineum: Normal    Assessment/Plan:  26 y.o. G3P1011 female for annual exam with monthly menses, no contraception.   1. Contraceptive options. I reviewed with her all options for contraception. She has used birth control pills in the past and issues remembering. Did not like the way she felt on Depo-Provera. Tried Ortho Evra but had issues with headaches. Also had IUD but had breakthrough bleeding with this. Reviewed possible Nexplanon with her. Also possible reinitiating birth control pills  with use of a phone app to help remind her to take them.  Patient thought that she may want to retry the IUD but at this point wants to start pills. Loestrin 1/20 equivalent Sunday start following her next menses. Back up contraception regardless. 2. History of recent chlamydia. GC/Chlamydia done today. Patient requests serum screening. HIV, RPR, hepatitis B, hepatitis C done. 3. Recent miscarriage. Menses started 1 week or early now. Check hCG. Wait until next menses to start the pills and call if she does not start on time. 4. Gardasil series discussed. Patient declines. 5. HSV. Uses Valtrex intermittently with outbreaks. Has supply but will call when needs more. 6. Breast health. SBE monthly reviewed. 7. Pap smear 2017. No Pap smear done today. No history of significant abnormal Pap smears previously. 8. Health maintenance. No other routine lab work done. Recent CBC was normal. Follow up if any issues when starting the birth control pills. Follow up on lab work. Follow up in one year for annual exam   Dara Lords MD, 9:04 AM 05/20/2016

## 2016-05-20 NOTE — Patient Instructions (Signed)
Start on the birth control pills as we discussed. Follow up if you want to consider the Nexplanon or the IUD.

## 2016-05-21 ENCOUNTER — Telehealth: Payer: Self-pay

## 2016-05-21 LAB — GC/CHLAMYDIA PROBE AMP
CT Probe RNA: NOT DETECTED
GC Probe RNA: NOT DETECTED

## 2016-05-21 LAB — RPR

## 2016-05-21 NOTE — Telephone Encounter (Signed)
Patient called for results of recent STD testing and qual pt. Informed all negative.

## 2016-06-07 ENCOUNTER — Encounter (HOSPITAL_BASED_OUTPATIENT_CLINIC_OR_DEPARTMENT_OTHER): Payer: Self-pay | Admitting: Emergency Medicine

## 2016-06-07 ENCOUNTER — Emergency Department (HOSPITAL_BASED_OUTPATIENT_CLINIC_OR_DEPARTMENT_OTHER)
Admission: EM | Admit: 2016-06-07 | Discharge: 2016-06-07 | Disposition: A | Discharge status: Home / Self Care | Payer: BLUE CROSS/BLUE SHIELD | Attending: Emergency Medicine | Admitting: Emergency Medicine

## 2016-06-07 DIAGNOSIS — N72 Inflammatory disease of cervix uteri: Secondary | ICD-10-CM | POA: Diagnosis not present

## 2016-06-07 DIAGNOSIS — R102 Pelvic and perineal pain: Secondary | ICD-10-CM | POA: Diagnosis present

## 2016-06-07 LAB — WET PREP, GENITAL
CLUE CELLS WET PREP: NONE SEEN
SPERM: NONE SEEN
Trich, Wet Prep: NONE SEEN
Yeast Wet Prep HPF POC: NONE SEEN

## 2016-06-07 LAB — PREGNANCY, URINE: PREG TEST UR: NEGATIVE

## 2016-06-07 LAB — URINALYSIS, ROUTINE W REFLEX MICROSCOPIC
Bilirubin Urine: NEGATIVE
Glucose, UA: NEGATIVE mg/dL
Hgb urine dipstick: NEGATIVE
Ketones, ur: NEGATIVE mg/dL
LEUKOCYTES UA: NEGATIVE
NITRITE: NEGATIVE
PROTEIN: NEGATIVE mg/dL
SPECIFIC GRAVITY, URINE: 1.024 (ref 1.005–1.030)
pH: 6 (ref 5.0–8.0)

## 2016-06-07 MED ORDER — CEFTRIAXONE SODIUM 250 MG IJ SOLR
250.0000 mg | Freq: Once | INTRAMUSCULAR | Status: AC
  Administered 2016-06-07: 250 mg via INTRAMUSCULAR
  Filled 2016-06-07: qty 250

## 2016-06-07 MED ORDER — AZITHROMYCIN 250 MG PO TABS
1000.0000 mg | ORAL_TABLET | Freq: Once | ORAL | Status: AC
  Administered 2016-06-07: 1000 mg via ORAL
  Filled 2016-06-07: qty 4

## 2016-06-07 MED ORDER — DOXYCYCLINE HYCLATE 100 MG PO CAPS: 100.0000 mg | ORAL_CAPSULE | Freq: Two times a day (BID) | ORAL | 0 refills | Status: DC

## 2016-06-07 MED ORDER — LIDOCAINE HCL (PF) 1 % IJ SOLN
INTRAMUSCULAR | Status: AC
  Administered 2016-06-07: 14:00:00
  Filled 2016-06-07: qty 5

## 2016-06-07 MED ORDER — NAPROXEN 500 MG PO TABS: 500.0000 mg | ORAL_TABLET | Freq: Two times a day (BID) | ORAL | 0 refills | Status: DC

## 2016-06-07 NOTE — ED Triage Notes (Signed)
Pelvic pain since yesterday, started during sexual intercourse. Pt reports vaginal discharge and dark urine.

## 2016-06-07 NOTE — Discharge Instructions (Signed)
Take the medications as needed for pain, follow-up with your OB/GYN doctor, return for fever or worsening symptoms

## 2016-06-07 NOTE — ED Provider Notes (Signed)
[facility]   CSN: [id number] Arrival date & time: 06/07/16  1301     History   Chief Complaint Chief Complaint  Patient presents with  . Pelvic Pain    HPI Alisha Edwards is a 26 y.o. female.  HPI Patient presents to the emergency room for evaluation of pelvic pain. Patient states her symptoms started yesterday after sexual intercourse.  She began having pain in her lower abdomen and pelvic region. Patient states this happens to her at times and so initially she was not too concerned however usually goes away after a brief period of time.  It persisted today so she came in for evaluation. She's had some dark spotting discharge. She denies any dysuria. She denies any vomiting or diarrhea. She denies any difficulty with her appetite. In the past she has been diagnosed with chlamydia Past Medical History:  Diagnosis Date  . HSV (herpes simplex virus) anogenital infection   . MRSA (methicillin resistant staph aureus) culture positive 2009   thigh  . STD (sexually transmitted disease)    Chlamydia    Patient Active Problem List   Diagnosis Date Noted  . Yeast vaginitis 04/12/2015    Past Surgical History:  Procedure Laterality Date  . MOUTH SURGERY    . nasal surgery      OB History    Gravida Para Term Preterm AB Living   SAB TAB Ectopic Multiple Live Births   1       1       Home Medications    Prior to Admission medications   Medication Sig Start Date End Date Taking? Authorizing Provider  doxycycline (VIBRAMYCIN) 100 MG capsule Take 1 capsule (100 mg total) by mouth 2 (two) times daily. 06/07/16   Linwood Dibbles, MD  naproxen (NAPROSYN) 500 MG tablet Take 1 tablet (500 mg total) by mouth 2 (two) times daily with a meal. As needed for pain 06/07/16   Linwood Dibbles, MD  norethindrone-ethinyl estradiol (MICROGESTIN,JUNEL,LOESTRIN) 1-20 MG-MCG tablet Take 1 tablet by mouth daily. 05/20/16   Dara Lords, MD    Family  History Family History  Problem Relation Age of Onset  . Hypertension Maternal Grandmother   . Breast cancer Maternal Grandmother     Age 36's  . Breast cancer Paternal Grandmother     Age 6'S    Social History Social History  Substance Use Topics  . Smoking status: Never Smoker  . Smokeless tobacco: Never Used  . Alcohol use 0.0 oz/week     Comment: rarely     Allergies   Shellfish allergy   Review of Systems Review of Systems  All other systems reviewed and are negative.    Physical Exam Updated Vital Signs BP 107/73 (BP Location: Left Arm)   Pulse 72   Temp 98.8 F (37.1 C) (Oral)   Resp 16   Ht  (1.651 m)   Wt 55.3 kg   LMP 05/15/2016   SpO2 100%   BMI 20.30 kg/m   Physical Exam  Constitutional: She appears well-developed and well-nourished. No distress.  HENT:  Head: Normocephalic and atraumatic.  Right Ear: External ear normal.  Left Ear: External ear normal.  Eyes: Conjunctivae are normal. Right eye exhibits no discharge. Left eye exhibits no discharge. No scleral icterus.  Neck: Neck supple. No tracheal deviation present.  Cardiovascular: Normal rate, regular rhythm and intact distal pulses.   Pulmonary/Chest: Effort normal  and breath sounds normal. No stridor. No respiratory distress. She has no wheezes. She has no rales.  Abdominal: Soft. Bowel sounds are normal. She exhibits no distension. There is no tenderness. There is no rebound and no guarding.  Genitourinary: Pelvic exam was performed with patient supine. There is no rash, tenderness, lesion or injury on the right labia. There is no rash, tenderness, lesion or injury on the left labia. Uterus is tender. Cervix exhibits discharge. Cervix exhibits no motion tenderness and no friability. Right adnexum displays tenderness. Right adnexum displays no mass and no fullness. Left adnexum displays tenderness. Left adnexum displays no mass and no fullness. No erythema or tenderness in the vagina. No  foreign body in the vagina. No signs of injury around the vagina. No vaginal discharge found.  Genitourinary Comments: Small amount of blood noted at the cervical os  Musculoskeletal: She exhibits no edema or tenderness.  Neurological: She is alert. She has normal strength. No cranial nerve deficit (no facial droop, extraocular movements intact, no slurred speech) or sensory deficit. She exhibits normal muscle tone. She displays no seizure activity. Coordination normal.  Skin: Skin is warm and dry. No rash noted.  Psychiatric: She has a normal mood and affect.  Nursing note and vitals reviewed.    ED Treatments / Results  Labs (all labs ordered are listed, but only abnormal results are displayed) Labs Reviewed  WET PREP, GENITAL - Abnormal; Notable for the following:       Result Value   WBC, Wet Prep HPF POC FEW (*)    All other components within normal limits  URINALYSIS, ROUTINE W REFLEX MICROSCOPIC  PREGNANCY, URINE  RPR  HIV ANTIBODY (ROUTINE TESTING)  GC/CHLAMYDIA PROBE AMP ([place]) NOT AT [facility]     Procedures Procedures (including critical care time)  Medications Ordered in ED Medications  cefTRIAXone (ROCEPHIN) injection 250 mg (250 mg Intramuscular Given 06/07/16 1345)  azithromycin (ZITHROMAX) tablet 1,000 mg (1,000 mg Oral Given 06/07/16 1345)  lidocaine (PF) (XYLOCAINE) 1 % injection (  Given 06/07/16 1346)     Initial Impression / Assessment and Plan / ED Course  I have reviewed the triage vital signs and the nursing notes.  Pertinent labs & imaging results that were available during my care of the patient were reviewed by me and considered in my medical decision making (see chart for details).   patient has uterine and adnexal tenderness on exam. Started after intercourse last night. I will treat her for cervicitis, possible early PID.  I doubt appendicitis , ureteral stone, cholitis etc.  Discussed importance of taking her antibiotics and following up with  her OB/GYN doctor. Final Clinical Impressions(s) / ED Diagnoses   Final diagnoses:  Cervicitis    New Prescriptions New Prescriptions   DOXYCYCLINE (VIBRAMYCIN) 100 MG CAPSULE    Take 1 capsule (100 mg total) by mouth 2 (two) times daily.   NAPROXEN (NAPROSYN) 500 MG TABLET    Take 1 tablet (500 mg total) by mouth 2 (two) times daily with a meal. As needed for pain     Linwood Dibbles, MD 06/07/16 1442

## 2016-06-08 LAB — RPR: RPR Ser Ql: NONREACTIVE

## 2016-06-08 LAB — HIV ANTIBODY (ROUTINE TESTING W REFLEX): HIV SCREEN 4TH GENERATION: NONREACTIVE

## 2016-06-09 ENCOUNTER — Encounter: Payer: Self-pay | Admitting: Gynecology

## 2016-06-09 ENCOUNTER — Ambulatory Visit (INDEPENDENT_AMBULATORY_CARE_PROVIDER_SITE_OTHER): Payer: BLUE CROSS/BLUE SHIELD | Admitting: Gynecology

## 2016-06-09 VITALS — BP 118/76

## 2016-06-09 DIAGNOSIS — R102 Pelvic and perineal pain: Secondary | ICD-10-CM | POA: Diagnosis not present

## 2016-06-09 LAB — GC/CHLAMYDIA PROBE AMP (~~LOC~~) NOT AT ARMC
CHLAMYDIA, DNA PROBE: NEGATIVE
Neisseria Gonorrhea: NEGATIVE

## 2016-06-09 NOTE — Patient Instructions (Signed)
Follow up for ultrasound as scheduled 

## 2016-06-09 NOTE — Progress Notes (Signed)
    Alisha Edwards 1990/07/01 [id number]        26 y.o.  G2P1011 presents in follow up having been evaluated this past weekend in the emergency room for the acute onset of pelvic pain on early on the right during intercourse. She notes she is having more episodes of pelvic pain with intercourse although not every time. No fever or chills. No nausea vomiting diarrhea constipation. No urinary symptoms such as frequency dysuria urgency. Had GC/chlamydia screen done but results are pending. Prep was negative. RPR and HIV were negative. UPT negative. Started on low-dose oral contraceptives this past month. Was given Rocephin and azithromycin and a prescription for doxycycline to take twice daily with the presumed diagnosis of possible PID. She did not take the doxycycline as she notes that her pain has resolved.   Past medical history,surgical history, problem list, medications, allergies, family history and social history were all reviewed and documented in the EPIC chart.  Directed ROS with pertinent positives and negatives documented in the history of present illness/assessment and plan.  Exam: Donell Beers Vitals:   06/09/16 1216  BP: 118/76   General appearance:  Normal Abdomen soft nontender without masses guarding rebound Pelvic external BUS vagina normal. Cervix normal. Uterus normal size midline mobile nontender. Adnexa without masses or tenderness.   Assessment/Plan:  26 y.o. G2P1011 with the above history.she was recently seen 05/20/2016 for her annual exam and had a negative GC and chlamydia screen at that point as well as negative serum STD screening. Will follow up on the GC/chlamydia screen done through the ER. At this point as her pain has resolved she is not going to start doxycycline. I suspect that she did not have PID but differential with dyspareunia was discussed with the patient to include positional, adhesions, endometriosis. Will start with ultrasound to rule out nonpalpable  abnormalities. Options to proceed with diagnostic laparoscopy discussed. She is going to think about this and make a decision if she wants to proceed with this at this point or continue to monitor assuming the ultrasound is normal.    Dara Lords MD, 12:28 PM 06/09/2016

## 2016-06-11 ENCOUNTER — Ambulatory Visit (INDEPENDENT_AMBULATORY_CARE_PROVIDER_SITE_OTHER): Payer: BLUE CROSS/BLUE SHIELD

## 2016-06-11 ENCOUNTER — Encounter: Payer: Self-pay | Admitting: Gynecology

## 2016-06-11 ENCOUNTER — Other Ambulatory Visit: Payer: Self-pay | Admitting: Gynecology

## 2016-06-11 ENCOUNTER — Ambulatory Visit (INDEPENDENT_AMBULATORY_CARE_PROVIDER_SITE_OTHER): Payer: BLUE CROSS/BLUE SHIELD | Admitting: Gynecology

## 2016-06-11 VITALS — BP 116/74

## 2016-06-11 DIAGNOSIS — R102 Pelvic and perineal pain unspecified side: Secondary | ICD-10-CM

## 2016-06-11 DIAGNOSIS — N831 Corpus luteum cyst of ovary, unspecified side: Secondary | ICD-10-CM

## 2016-06-11 NOTE — Progress Notes (Signed)
    Alisha Edwards Apr 28, 1990 [id number]        26 y.o.  G2P1011 presents for ultrasound. History of recurrent pelvic pain recently evaluated in the emergency room and treated for PID. Her GC and chlamydia cultures ultimately returned negative. Having intermittent deep dyspareunia although not consistent on a regular basis. Most recently started on low-dose oral contraceptives this past month.  Past medical history,surgical history, problem list, medications, allergies, family history and social history were all reviewed and documented in the EPIC chart.  Directed ROS with pertinent positives and negatives documented in the history of present illness/assessment and plan.  Exam: Vitals:   06/11/16 1059  BP: 116/74   General appearance:  Normal  Ultrasound transvaginal shows uterus normal size and echotexture. Endometrial echo 5.1 mm. Right ovary with thick walled cyst and peripheral flow 26 x 17 x 25 mm consistent with corpus luteum. Left ovary normal. Cul-de-sac negative.  Assessment/Plan:  26 y.o. G2P1011 with history as above. Ultrasound normal with corpus luteal cyst right ovary. Recommended patient continue on oral contraceptives for now for ovarian suppression and keep a pain calendar. If pain resolves or is infrequent them will follow. If persists or certainly worsens them will represent for laparoscopy discussion.    Dara Lords MD, 11:12 AM 06/11/2016

## 2016-06-11 NOTE — Patient Instructions (Signed)
Continue on the birth control pills for now. Follow up if the pelvic pain continues.

## 2016-06-12 ENCOUNTER — Other Ambulatory Visit: Payer: Self-pay | Admitting: Women's Health

## 2016-06-17 ENCOUNTER — Telehealth: Payer: Self-pay | Admitting: *Deleted

## 2016-06-17 MED ORDER — TRANEXAMIC ACID 650 MG PO TABS: ORAL_TABLET | ORAL | 0 refills | Status: DC

## 2016-06-17 NOTE — Telephone Encounter (Signed)
Left message for pt to call.

## 2016-06-17 NOTE — Telephone Encounter (Signed)
I would recommend Lysteda 650 mg tablet, 2 by mouth 3 times daily for up to 5 days #30 no refill. This will help slow down the bleeding

## 2016-06-17 NOTE — Telephone Encounter (Signed)
Pt called cycle started on 06/15/16 and bleeding is heavy, taking birth control pills daily, pt said changing tampon every 1 1/2 hours, with clots. Pt said you have prescribed megace 20 mg in past and it helped with bleeding. Pt asked if this Rx could be prescribed again? Please advise

## 2016-06-17 NOTE — Telephone Encounter (Signed)
Pt aware, Rx sent. 

## 2016-06-30 ENCOUNTER — Telehealth: Payer: Self-pay | Admitting: *Deleted

## 2016-06-30 MED ORDER — FLUCONAZOLE 150 MG PO TABS: ORAL_TABLET | ORAL | 0 refills | Status: DC

## 2016-06-30 NOTE — Telephone Encounter (Signed)
(  TF patient) Pt called c/o yeast infection will be leaving out of town asked if diflucan tablet #2 could be prescribed 1 for now and repeat if needed. Please advise

## 2016-06-30 NOTE — Telephone Encounter (Signed)
Pt informed, Rx sent. 

## 2016-06-30 NOTE — Telephone Encounter (Signed)
Okay for refill if no relief office visit.   

## 2016-11-10 ENCOUNTER — Encounter (HOSPITAL_BASED_OUTPATIENT_CLINIC_OR_DEPARTMENT_OTHER): Payer: Self-pay | Admitting: Emergency Medicine

## 2016-11-10 ENCOUNTER — Emergency Department (HOSPITAL_BASED_OUTPATIENT_CLINIC_OR_DEPARTMENT_OTHER)
Admission: EM | Admit: 2016-11-10 | Discharge: 2016-11-10 | Disposition: A | Discharge status: Home / Self Care | Payer: BLUE CROSS/BLUE SHIELD | Attending: Emergency Medicine | Admitting: Emergency Medicine

## 2016-11-10 DIAGNOSIS — N939 Abnormal uterine and vaginal bleeding, unspecified: Secondary | ICD-10-CM | POA: Insufficient documentation

## 2016-11-10 LAB — CBC WITH DIFFERENTIAL/PLATELET
Basophils Absolute: 0 10*3/uL (ref 0.0–0.1)
Basophils Relative: 0 %
Eosinophils Absolute: 0.1 10*3/uL (ref 0.0–0.7)
Eosinophils Relative: 3 %
HEMATOCRIT: 37.3 % (ref 36.0–46.0)
HEMOGLOBIN: 12.3 g/dL (ref 12.0–15.0)
LYMPHS ABS: 2.4 10*3/uL (ref 0.7–4.0)
LYMPHS PCT: 45 %
MCH: 30.9 pg (ref 26.0–34.0)
MCHC: 33 g/dL (ref 30.0–36.0)
MCV: 93.7 fL (ref 78.0–100.0)
Monocytes Absolute: 0.5 10*3/uL (ref 0.1–1.0)
Monocytes Relative: 9 %
NEUTROS ABS: 2.2 10*3/uL (ref 1.7–7.7)
NEUTROS PCT: 43 %
PLATELETS: 232 10*3/uL (ref 150–400)
RBC: 3.98 MIL/uL (ref 3.87–5.11)
RDW: 12.3 % (ref 11.5–15.5)
WBC: 5.2 10*3/uL (ref 4.0–10.5)

## 2016-11-10 LAB — WET PREP, GENITAL
Clue Cells Wet Prep HPF POC: NONE SEEN
Sperm: NONE SEEN
Trich, Wet Prep: NONE SEEN
Yeast Wet Prep HPF POC: NONE SEEN

## 2016-11-10 LAB — URINALYSIS, ROUTINE W REFLEX MICROSCOPIC
BILIRUBIN URINE: NEGATIVE
GLUCOSE, UA: NEGATIVE mg/dL
HGB URINE DIPSTICK: NEGATIVE
KETONES UR: NEGATIVE mg/dL
Leukocytes, UA: NEGATIVE
Nitrite: NEGATIVE
PROTEIN: NEGATIVE mg/dL
Specific Gravity, Urine: 1.015 (ref 1.005–1.030)
pH: 7.5 (ref 5.0–8.0)

## 2016-11-10 LAB — PREGNANCY, URINE: Preg Test, Ur: NEGATIVE

## 2016-11-10 MED ORDER — NORGESTIMATE-ETH ESTRADIOL 0.25-35 MG-MCG PO TABS: 1.0000 | ORAL_TABLET | Freq: Every day | ORAL | 11 refills | Status: DC

## 2016-11-10 NOTE — ED Provider Notes (Signed)
Emergency Department Provider Note   I have reviewed the triage vital signs and the nursing notes.   HISTORY  Chief Complaint Vaginal Bleeding   HPI Alisha Edwards is a 26 y.o. female with PMH of HSV and prior STD presents to the emergency department for evaluation of vaginal bleeding and lower abdominal cramping discomfort. Symptoms began yesterday. Patient's last menstrual period was 2 weeks ago. She reports typically being very regular and was concerned when she began to have bleeding yesterday. She states yesterday was more heavy and has decreased today she continues to have some light cramping. Denies any vaginal discharge. No dysuria, hesitancy, urgency. She is not on oral contraception or depo shot. No IUD. States that she has felt very fatigued but denies any positional lightheadedness, dyspnea, palpitations.  Past Medical History:  Diagnosis Date  . HSV (herpes simplex virus) anogenital infection   . MRSA (methicillin resistant staph aureus) culture positive 2009   thigh  . STD (sexually transmitted disease)    Chlamydia    Patient Active Problem List   Diagnosis Date Noted  . Yeast vaginitis 04/12/2015    Past Surgical History:  Procedure Laterality Date  . MOUTH SURGERY    . nasal surgery      Current Outpatient Rx  . Order #: [id number] Class: Normal  . Order #: [id number] Class: Normal  . Order #: [id number] Class: Print  . Order #: [id number] Class: Normal  . Order #: [id number] Class: Normal    Allergies Shellfish allergy  Family History  Problem Relation Age of Onset  . Hypertension Maternal Grandmother   . Breast cancer Maternal Grandmother        Age 17's  . Breast cancer Paternal Grandmother        Age 41'S    Social History Social History  Substance Use Topics  . Smoking status: Never Smoker  . Smokeless tobacco: Never Used  . Alcohol use 0.0 oz/week     Comment: rarely    Review of Systems  Constitutional: No fever/chills Eyes: No  visual changes. ENT: No sore throat. Cardiovascular: Denies chest pain. Respiratory: Denies shortness of breath. Gastrointestinal: No abdominal pain.  No nausea, no vomiting.  No diarrhea.  No constipation. Genitourinary: Negative for dysuria. Positive vaginal bleeding.  Musculoskeletal: Negative for back pain. Skin: Negative for rash. Neurological: Negative for headaches, focal weakness or numbness.  10-point ROS otherwise negative.  ____________________________________________   PHYSICAL EXAM:  VITAL SIGNS: ED Triage Vitals  Enc Vitals Group     BP 11/10/16 1510 111/74     Pulse Rate 11/10/16 1510 83     Resp 11/10/16 1510 18     Temp 11/10/16 1510 98.4 F (36.9 C)     Temp Source 11/10/16 1510 Oral     SpO2 11/10/16 1510 100 %     Weight 11/10/16 1508 137 lb (62.1 kg)     Height 11/10/16 1508  (1.651 m)     Pain Score 11/10/16 1507 7   Constitutional: Alert and oriented. Well appearing and in no acute distress. Eyes: Conjunctivae are normal.  Head: Atraumatic. Nose: No congestion/rhinnorhea. Mouth/Throat: Mucous membranes are moist.   Neck: No stridor. Cardiovascular: Normal rate, regular rhythm. Good peripheral circulation. Grossly normal heart sounds.   Respiratory: Normal respiratory effort.  No retractions. Lungs CTAB. Gastrointestinal: Soft and nontender. No distention.  Genitourinary: Dried scant blood in the vaginal vault. No BRB. Normal cervix. No CMT or adnexal tenderness/fullness.  Musculoskeletal: No lower extremity tenderness nor edema. No gross  deformities of extremities. Neurologic:  Normal speech and language. No gross focal neurologic deficits are appreciated.  Skin:  Skin is warm, dry and intact. No rash noted.   ____________________________________________   LABS (all labs ordered are listed, but only abnormal results are displayed)  Labs Reviewed  WET PREP, GENITAL - Abnormal; Notable for the following:       Result Value   WBC, Wet  Prep HPF POC FEW (*)    All other components within normal limits  URINALYSIS, ROUTINE W REFLEX MICROSCOPIC  PREGNANCY, URINE  CBC WITH DIFFERENTIAL/PLATELET  GC/CHLAMYDIA PROBE AMP ([place]) NOT AT [facility]   ____________________________________________  RADIOLOGY  None ____________________________________________   PROCEDURES  Procedure(s) performed:   Procedures  None ____________________________________________   INITIAL IMPRESSION / ASSESSMENT AND PLAN / ED COURSE  Pertinent labs & imaging results that were available during my care of the patient were reviewed by me and considered in my medical decision making (see chart for details).  Patient presents to the emergency department for evaluation of vaginal bleeding. On exam the patient has scant dried blood in the vaginal vault with no active bright red bleeding. Visually normal cervix. Normal external genitalia. Mild discomfort throughout on bimanual exam but no cervical motion tenderness or adnexal fullness or discomfort. Plan for CBC, UA, pregnancy, and reassess.  Labs unremarkable. Exam unremarkable. Prescribed OCP to start if vaginal bleeding worsens in the next 24 hours but symptoms seem to be improving. Patient not pregnant. She will follow with OB/Gyn in the coming week.   At this time, I do not feel there is any life-threatening condition present. I have reviewed and discussed all results (EKG, imaging, lab, urine as appropriate), exam findings with patient. I have reviewed nursing notes and appropriate previous records.  I feel the patient is safe to be discharged home without further emergent workup. Discussed usual and customary return precautions. Patient and family (if present) verbalize understanding and are comfortable with this plan.  Patient will follow-up with their primary care provider. If they do not have a primary care provider, information for follow-up has been provided to them. All questions have been  answered.  ____________________________________________  FINAL CLINICAL IMPRESSION(S) / ED DIAGNOSES  Final diagnoses:  Vaginal bleeding     MEDICATIONS GIVEN DURING THIS VISIT:  Medications - No data to display   NEW OUTPATIENT MEDICATIONS STARTED DURING THIS VISIT:  Discharge Medication List as of 11/10/2016  4:17 PM    START taking these medications   Details  norgestimate-ethinyl estradiol (SPRINTEC 28) 0.25-35 MG-MCG tablet Take 1 tablet by mouth daily., Starting Mon 11/10/2016, Print        Note:  This document was prepared using Dragon voice recognition software and may include unintentional dictation errors.  Alona Bene, MD Emergency Medicine    Rayah Fines, Arlyss Repress, MD 11/10/16 [phone]

## 2016-11-10 NOTE — Discharge Instructions (Signed)
You have received a prescription for a medication called Sprintec. This is an oral contraceptive. This one package he should take 3 pills a day for one week then throw the rest away. This should help with your uterine bleeding. It should cease during this time. After this week take one week off and during this time and will likely have a regular period. This may cause a little bit of nausea when you're taking 3 a day if so you may back down to twice a day. Please follow-up with her gynecologist in 1-2 weeks for further evaluation and management of your vaginal bleeding. ° °

## 2016-11-10 NOTE — ED Triage Notes (Addendum)
Patient states that she has had her "menstaL' for the month and then last night she started to have vaginal bleeding again. Lower back and abdominal cramping noted to the patinet

## 2016-11-11 LAB — GC/CHLAMYDIA PROBE AMP (~~LOC~~) NOT AT ARMC
CHLAMYDIA, DNA PROBE: NEGATIVE
NEISSERIA GONORRHEA: NEGATIVE

## 2017-12-22 ENCOUNTER — Encounter: Payer: BLUE CROSS/BLUE SHIELD | Admitting: Gynecology

## 2018-02-17 ENCOUNTER — Emergency Department
Admission: EM | Admit: 2018-02-17 | Discharge: 2018-02-17 | Disposition: A | Discharge status: Home / Self Care | Payer: 59 | Attending: Emergency Medicine | Admitting: Emergency Medicine

## 2018-02-17 ENCOUNTER — Other Ambulatory Visit: Payer: Self-pay

## 2018-02-17 ENCOUNTER — Emergency Department: Payer: 59

## 2018-02-17 ENCOUNTER — Encounter: Payer: Self-pay | Admitting: Emergency Medicine

## 2018-02-17 DIAGNOSIS — Z79899 Other long term (current) drug therapy: Secondary | ICD-10-CM | POA: Diagnosis not present

## 2018-02-17 DIAGNOSIS — S161XXA Strain of muscle, fascia and tendon at neck level, initial encounter: Secondary | ICD-10-CM | POA: Diagnosis not present

## 2018-02-17 DIAGNOSIS — Y999 Unspecified external cause status: Secondary | ICD-10-CM | POA: Insufficient documentation

## 2018-02-17 DIAGNOSIS — Y92410 Unspecified street and highway as the place of occurrence of the external cause: Secondary | ICD-10-CM | POA: Diagnosis not present

## 2018-02-17 DIAGNOSIS — Y9389 Activity, other specified: Secondary | ICD-10-CM | POA: Diagnosis not present

## 2018-02-17 DIAGNOSIS — S199XXA Unspecified injury of neck, initial encounter: Secondary | ICD-10-CM | POA: Diagnosis present

## 2018-02-17 MED ORDER — BACLOFEN 10 MG PO TABS: 10.0000 mg | ORAL_TABLET | Freq: Three times a day (TID) | ORAL | 1 refills | Status: AC

## 2018-02-17 MED ORDER — MELOXICAM 15 MG PO TABS: 15.0000 mg | ORAL_TABLET | Freq: Every day | ORAL | 2 refills | Status: AC
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
## 2018-09-28 ENCOUNTER — Ambulatory Visit: Payer: Self-pay

## 2018-11-01 ENCOUNTER — Encounter: Payer: Self-pay | Admitting: Gynecology

## 2019-02-01 ENCOUNTER — Ambulatory Visit: Payer: 59 | Attending: Internal Medicine

## 2019-02-01 ENCOUNTER — Other Ambulatory Visit: Payer: 59

## 2019-02-01 DIAGNOSIS — U071 COVID-19: Secondary | ICD-10-CM

## 2019-02-01 DIAGNOSIS — R238 Other skin changes: Secondary | ICD-10-CM

## 2019-02-04 LAB — NOVEL CORONAVIRUS, NAA: SARS-CoV-2, NAA: NOT DETECTED

## 2019-10-14 ENCOUNTER — Emergency Department (HOSPITAL_BASED_OUTPATIENT_CLINIC_OR_DEPARTMENT_OTHER)
Admission: EM | Admit: 2019-10-14 | Discharge: 2019-10-14 | Disposition: A | Discharge status: Home / Self Care | Payer: 59 | Attending: Emergency Medicine | Admitting: Emergency Medicine

## 2019-10-14 ENCOUNTER — Other Ambulatory Visit: Payer: Self-pay

## 2019-10-14 ENCOUNTER — Encounter (HOSPITAL_BASED_OUTPATIENT_CLINIC_OR_DEPARTMENT_OTHER): Payer: Self-pay | Admitting: *Deleted

## 2019-10-14 DIAGNOSIS — Z5321 Procedure and treatment not carried out due to patient leaving prior to being seen by health care provider: Secondary | ICD-10-CM | POA: Insufficient documentation

## 2019-10-14 DIAGNOSIS — R072 Precordial pain: Secondary | ICD-10-CM | POA: Diagnosis present

## 2019-10-14 NOTE — ED Triage Notes (Signed)
Chest pain midsternal denies n/v  No sob,

## 2019-11-14 ENCOUNTER — Ambulatory Visit: Payer: Self-pay | Admitting: Family Medicine

## 2019-11-14 ENCOUNTER — Other Ambulatory Visit: Payer: Self-pay

## 2019-11-14 ENCOUNTER — Encounter: Payer: Self-pay | Admitting: Family Medicine

## 2019-11-14 DIAGNOSIS — Z113 Encounter for screening for infections with a predominantly sexual mode of transmission: Secondary | ICD-10-CM

## 2019-11-14 LAB — WET PREP FOR TRICH, YEAST, CLUE
Trichomonas Exam: NEGATIVE
Yeast Exam: NEGATIVE

## 2019-11-14 NOTE — Progress Notes (Signed)
Patient here for STD testing.Alisha Rosemond Brewer-Jensen, RN 

## 2019-11-14 NOTE — Progress Notes (Signed)
[facility] Department STI clinic/screening visit  Subjective:  Alisha Edwards is a 29 y.o. female being seen today for  Chief Complaint  Patient presents with  . Exposure to STD     The patient reports they do have symptoms. Patient reports that they do not desire a pregnancy in the next year. They reported they are not interested in discussing contraception today.   Patient has the following medical conditions:   Patient Active Problem List   Diagnosis Date Noted  . Yeast vaginitis 04/12/2015  . Mood disorder (HCC) 08/19/2013  . HSV (herpes simplex virus) infection     HPI  Pt reports she is here for STI screen. Has had increased discharge, vaginal redness and irritation x5 days. Denies itching. Took diflucan 3 days ago and again 1 day ago with much improvement of symptoms though would like STI screen to be sure.  Has had pain w/sex since dx of cyst on ovary years ago, was told it didn't need to be removed.   See flowsheet for further details and programmatic requirements.    Patient's last menstrual period was 10/27/2019 (approximate). Last sex: 5 days BCM: none Desires EC? Pt declines  Last pap per pt/review of record: 04/2015: NIL Last HIV test per pt/review of record: 05/2016  Last tetanus vaccine: w/in 10 years  No components found for: HCV  The following portions of the patient's history were reviewed and updated as appropriate: allergies, current medications, past medical history, past social history, past surgical history and problem list.  Objective:  There were no vitals filed for this visit.   Physical Exam Vitals and nursing note reviewed.  Constitutional:      Appearance: Normal appearance.  HENT:     Head: Normocephalic and atraumatic.     Mouth/Throat:     Mouth: Mucous membranes are moist.     Pharynx: Oropharynx is clear. No oropharyngeal exudate or posterior oropharyngeal erythema.  Pulmonary:     Effort: Pulmonary effort is  normal.  Abdominal:     General: Abdomen is flat.     Palpations: There is no mass.     Tenderness: There is no abdominal tenderness. There is no rebound.  Genitourinary:    General: Normal vulva.     Exam position: Lithotomy position.     Pubic Area: No rash or pubic lice.      Labia:        Right: No rash or lesion.        Left: No rash or lesion.      Vagina: Normal. No vaginal discharge (white, ph<4.5), erythema, bleeding or lesions.     Cervix: No cervical motion tenderness, discharge, friability, lesion or erythema.     Uterus: Normal.      Adnexa: Right adnexa normal and left adnexa normal.     Rectum: Normal.  Lymphadenopathy:     Head:     Right side of head: No preauricular or posterior auricular adenopathy.     Left side of head: No preauricular or posterior auricular adenopathy.     Cervical: No cervical adenopathy.     Upper Body:     Right upper body: No supraclavicular or axillary adenopathy.     Left upper body: No supraclavicular or axillary adenopathy.     Lower Body: No right inguinal adenopathy. No left inguinal adenopathy.  Skin:    General: Skin is warm and dry.     Findings: No rash.  Neurological:  Mental Status: She is alert and oriented to person, place, and time.      Assessment and Plan:  AVYANNA SPADA is a 29 y.o. female presenting to the [facility] Department for STI screening   1. Screening examination for venereal disease -Pt with symptoms. Screenings today as below. Treat wet prep per standing order. -Patient does meet criteria for HepB, HepC Screening. Accepts these screenings. -Counseled on warning s/sx and when to seek care. Recommended condom use with all sex and discussed importance of condom use for STI prevention. - WET PREP FOR TRICH, YEAST, CLUE - Chlamydia/Gonorrhea [place] [facility] - HIV/HCV [place] [facility] - HBV Antigen/Antibody [place] [facility] - Syphilis Serology, [place] [facility]  Advised to make a f/u appt for  physical, pap and BC discussion if desired.   Return in about 1 week (around 11/21/2019) for yearly wellness exam.  No future appointments.  Ann Held, PA-C

## 2019-11-22 ENCOUNTER — Encounter: Payer: Self-pay | Admitting: Family Medicine

## 2019-11-22 LAB — HM HIV SCREENING LAB: HM HIV Screening: NEGATIVE

## 2019-11-22 LAB — HEPATITIS B SURFACE ANTIGEN

## 2019-12-09 ENCOUNTER — Encounter (HOSPITAL_COMMUNITY): Payer: Self-pay | Admitting: Obstetrics & Gynecology

## 2019-12-09 ENCOUNTER — Other Ambulatory Visit: Payer: Self-pay

## 2019-12-09 ENCOUNTER — Inpatient Hospital Stay (HOSPITAL_COMMUNITY)
Admission: AD | Admit: 2019-12-09 | Discharge: 2019-12-09 | Disposition: A | Discharge status: Home / Self Care | Payer: 59 | Attending: Obstetrics & Gynecology | Admitting: Obstetrics & Gynecology

## 2019-12-09 ENCOUNTER — Inpatient Hospital Stay (HOSPITAL_COMMUNITY): Payer: 59

## 2019-12-09 DIAGNOSIS — O26899 Other specified pregnancy related conditions, unspecified trimester: Secondary | ICD-10-CM

## 2019-12-09 DIAGNOSIS — Z3A01 Less than 8 weeks gestation of pregnancy: Secondary | ICD-10-CM | POA: Diagnosis not present

## 2019-12-09 DIAGNOSIS — Z3A00 Weeks of gestation of pregnancy not specified: Secondary | ICD-10-CM

## 2019-12-09 DIAGNOSIS — R109 Unspecified abdominal pain: Secondary | ICD-10-CM | POA: Diagnosis not present

## 2019-12-09 DIAGNOSIS — O26891 Other specified pregnancy related conditions, first trimester: Secondary | ICD-10-CM | POA: Insufficient documentation

## 2019-12-09 LAB — URINALYSIS, ROUTINE W REFLEX MICROSCOPIC
Bilirubin Urine: NEGATIVE
Glucose, UA: NEGATIVE mg/dL
Hgb urine dipstick: NEGATIVE
Ketones, ur: NEGATIVE mg/dL
Leukocytes,Ua: NEGATIVE
Nitrite: NEGATIVE
Protein, ur: NEGATIVE mg/dL
Specific Gravity, Urine: 1.026 (ref 1.005–1.030)
pH: 6 (ref 5.0–8.0)

## 2019-12-09 LAB — CBC
HCT: 39.8 % (ref 36.0–46.0)
Hemoglobin: 13 g/dL (ref 12.0–15.0)
MCH: 30 pg (ref 26.0–34.0)
MCHC: 32.7 g/dL (ref 30.0–36.0)
MCV: 91.7 fL (ref 80.0–100.0)
Platelets: 238 10*3/uL (ref 150–400)
RBC: 4.34 MIL/uL (ref 3.87–5.11)
RDW: 12.7 % (ref 11.5–15.5)
WBC: 5.6 10*3/uL (ref 4.0–10.5)
nRBC: 0 % (ref 0.0–0.2)

## 2019-12-09 LAB — WET PREP, GENITAL
Clue Cells Wet Prep HPF POC: NONE SEEN
Sperm: NONE SEEN
Trich, Wet Prep: NONE SEEN
Yeast Wet Prep HPF POC: NONE SEEN

## 2019-12-09 LAB — POCT PREGNANCY, URINE: Preg Test, Ur: POSITIVE — AB

## 2019-12-09 LAB — HCG, QUANTITATIVE, PREGNANCY: hCG, Beta Chain, Quant, S: 2640 m[IU]/mL — ABNORMAL HIGH (ref <5)

## 2019-12-09 MED ORDER — PREPLUS 27-1 MG PO TABS
1.0000 | ORAL_TABLET | Freq: Every day | ORAL | 13 refills | Status: DC
Start: 2019-12-09 — End: 2020-05-09

## 2019-12-09 NOTE — MAU Provider Note (Signed)
Chief Complaint:  Abdominal Pain and Possible Pregnancy   First Provider Initiated Contact with Patient 12/09/19 1444     HPI: Alisha Edwards is a 29 y.o. G3P1011 at [redacted]w[redacted]d who presents to maternity admissions reporting intermittent pelvic cramping, but no complaints of vaginal bleeding. She has had recurrent yeast over the past couple of months and has used diflucan and boric acid trying to correct it. She's had a +HPT, her LMP was 10/31/19.  Past Medical History:  Diagnosis Date  . HSV (herpes simplex virus) anogenital infection   . MRSA (methicillin resistant staph aureus) culture positive 2009   thigh  . STD (sexually transmitted disease)    Chlamydia   OB History  Gravida Para Term Preterm AB Living  3 1 1   1 1   SAB TAB Ectopic Multiple Live Births  1       1    # Outcome Date GA Lbr Len/2nd Weight Sex Delivery Anes PTL Lv  3 Current           2 Term 01/25/11 [redacted]w[redacted]d 11:01 / 01:21 7 lb 1.6 oz (3.221 kg) F Vag-Spont EPI  LIV  1 SAB            Past Surgical History:  Procedure Laterality Date  . MOUTH SURGERY    . nasal surgery     Family History  Problem Relation Age of Onset  . Hypertension Maternal Grandmother   . Breast cancer Maternal Grandmother        Age 20's  . Breast cancer Paternal Grandmother        Age 67'S   Social History   Tobacco Use  . Smoking status: Never Smoker  . Smokeless tobacco: Never Used  Vaping Use  . Vaping Use: Never used  Substance Use Topics  . Alcohol use: Yes    Alcohol/week: 0.0 standard drinks    Comment: rarely  . Drug use: No   Allergies  Allergen Reactions  . Shellfish Allergy Itching and Swelling    Mouth swells and itches   No medications prior to admission.   I have reviewed patient's Past Medical Hx, Surgical Hx, Family Hx, Social Hx, medications and allergies.   ROS:  Review of Systems  Constitutional: Negative for fatigue and fever.  HENT: Negative for congestion and sore throat.   Eyes: Negative for visual  disturbance.  Respiratory: Negative for shortness of breath.   Gastrointestinal: Negative for nausea.  Genitourinary: Positive for pelvic pain and vaginal discharge. Negative for vaginal bleeding.  Neurological: Negative for dizziness, syncope and headaches.  All other systems reviewed and are negative.  Physical Exam   Patient Vitals for the past 24 hrs:  BP Temp Temp src Pulse Resp SpO2 Height Weight  12/09/19 1543 94/64 -- -- 71 -- 100 % -- --  12/09/19 1120 105/69 98.6 F (37 C) Oral 84 16 100 % 5\' 4"  (1.626 m) 138 lb (62.6 kg)   Constitutional: Well-developed, well-nourished female in no acute distress.  Cardiovascular: normal rate & rhythm, no murmur Respiratory: normal effort, lung sounds clear throughout GI: Abd soft, non-tender, gravid appropriate for gestational age. Pos BS x 4 MS: Extremities nontender, no edema, normal ROM Neurologic: Alert and oriented x 4.  Pelvic: NEFG, physiologic discharge, no blood, cervix clean.   Labs: Results for orders placed or performed during the hospital encounter of 12/09/19 (from the past 24 hour(s))  Urinalysis, Routine w reflex microscopic Urine, Clean Catch     Status: None  Collection Time: 12/09/19 11:23 AM  Result Value Ref Range   Color, Urine YELLOW YELLOW   APPearance CLEAR CLEAR   Specific Gravity, Urine 1.026 1.005 - 1.030   pH 6.0 5.0 - 8.0   Glucose, UA NEGATIVE NEGATIVE mg/dL   Hgb urine dipstick NEGATIVE NEGATIVE   Bilirubin Urine NEGATIVE NEGATIVE   Ketones, ur NEGATIVE NEGATIVE mg/dL   Protein, ur NEGATIVE NEGATIVE mg/dL   Nitrite NEGATIVE NEGATIVE   Leukocytes,Ua NEGATIVE NEGATIVE  Pregnancy, urine POC     Status: Abnormal   Collection Time: 12/09/19 11:42 AM  Result Value Ref Range   Preg Test, Ur POSITIVE (A) NEGATIVE  CBC     Status: None   Collection Time: 12/09/19 12:11 PM  Result Value Ref Range   WBC 5.6 4.0 - 10.5 K/uL   RBC 4.34 3.87 - 5.11 MIL/uL   Hemoglobin 13.0 12.0 - 15.0 g/dL   HCT 40.7 36  - 46 %   MCV 91.7 80.0 - 100.0 fL   MCH 30.0 26.0 - 34.0 pg   MCHC 32.7 30.0 - 36.0 g/dL   RDW 68.0 88.1 - 10.3 %   Platelets 238 150 - 400 K/uL   nRBC 0.0 0.0 - 0.2 %  hCG, quantitative, pregnancy     Status: Abnormal   Collection Time: 12/09/19 12:11 PM  Result Value Ref Range   hCG, Beta Chain, Quant, S 2,640 (H) <5 mIU/mL  Wet prep, genital     Status: Abnormal   Collection Time: 12/09/19  2:47 PM  Result Value Ref Range   Yeast Wet Prep HPF POC NONE SEEN NONE SEEN   Trich, Wet Prep NONE SEEN NONE SEEN   Clue Cells Wet Prep HPF POC NONE SEEN NONE SEEN   WBC, Wet Prep HPF POC MODERATE (A) NONE SEEN   Sperm NONE SEEN     Imaging:  US OB LESS THAN 14 WEEKS WITH OB TRANSVAGINAL  Result Date: 12/09/2019 CLINICAL DATA:  Pelvic cramping.  Positive pregnancy test. EXAM: OBSTETRIC <14 WK [place] AND TRANSVAGINAL OB US TECHNIQUE: Both transabdominal and transvaginal ultrasound examinations were performed for complete evaluation of the gestation as well as the maternal uterus, adnexal regions, and pelvic cul-de-sac. Transvaginal technique was performed to assess early pregnancy. COMPARISON:  None. FINDINGS: Intrauterine gestational sac: Single Yolk sac:  None Embryo:  None Cardiac Activity: None Heart Rate: None bpm MSD: 3.8 mm   5 w   0 d Subchorionic hemorrhage:  None visualized. Maternal uterus/adnexae: Normal ovaries. IMPRESSION: 1. Intrauterine gestational sac estimated at 5 weeks and 0 days gestation. Probable early intrauterine gestational sac, but no yolk sac, fetal pole, or cardiac activity yet visualized. Recommend follow-up quantitative B-HCG levels and follow-up US in 14 days to assess viability. This recommendation follows SRU consensus guidelines: Diagnostic Criteria for Nonviable Pregnancy Early in the First Trimester. Malva Limes Med 2013; 159:4585-92. 2. Normal ovaries. Electronically Signed   By: Rudie Meyer M.D.   On: 12/09/2019 14:29    MAU Course: Orders Placed This Encounter   Procedures  . Wet prep, genital  . US OB LESS THAN 14 WEEKS WITH OB TRANSVAGINAL  . US OB Follow Up  . Urinalysis, Routine w reflex microscopic Urine, Clean Catch  . CBC  . hCG, quantitative, pregnancy  . B-HCG Quant  . Pregnancy, urine POC  . Discharge patient   Meds ordered this encounter  Medications  . Prenatal Vit-Fe Fumarate-FA (PREPLUS) 27-1 MG TABS    Sig: Take 1 tablet  by mouth daily.    Dispense:  30 tablet    Refill:  13    Order Specific Question:   Supervising Provider    Answer:   Samara Snide   MDM: Labs and U/S normal with suggested follow up Wet prep normal, gc/ct pending  Assessment: 1. Cramping affecting pregnancy, antepartum     Plan: Discharge home in stable condition with return precautions.  Will follow GC/CT results and manage accordingly.   Follow-up Information    [facility] for [facility]. Go in 2 week(s).   Specialty: Obstetrics and Gynecology Why: Follow up for repeat bloodwork and ultrasound - office will call you to schedule appointment Contact information: [phone] [place] [address] [phone]              Allergies as of 12/09/2019      Reactions   Shellfish Allergy Itching, Swelling   Mouth swells and itches      Medication List    STOP taking these medications   fluconazole 150 MG tablet Commonly known as: DIFLUCAN     TAKE these medications   ALPRAZolam 0.5 MG tablet Commonly known as: XANAX Take 0.5 mg by mouth at bedtime as needed for anxiety.   escitalopram 20 MG tablet Commonly known as: LEXAPRO Take 20 mg by mouth daily.   PrePLUS 27-1 MG Tabs Take 1 tablet by mouth daily.   valACYclovir 500 MG tablet Commonly known as: VALTREX take 1 tablet by mouth once daily      Edd Arbour, CNM, MSN, [facility] 12/09/19 5:04 PM

## 2019-12-09 NOTE — Discharge Instructions (Signed)
Will send MyChart message if positive for gonorrhea or chlamydia and send prescriptions to your pharmacy.  First Trimester of Pregnancy  The first trimester of pregnancy is from week 1 until the end of week 13 (months 1 through 3). During this time, your baby will begin to develop inside you. At 6-8 weeks, the eyes and face are formed, and the heartbeat can be seen on ultrasound. At the end of 12 weeks, all the baby's organs are formed. Prenatal care is all the medical care you receive before the birth of your baby. Make sure you get good prenatal care and follow all of your doctor's instructions. Follow these instructions at home: Medicines  Take over-the-counter and prescription medicines only as told by your doctor. Some medicines are safe and some medicines are not safe during pregnancy.  Take a prenatal vitamin that contains at least 600 micrograms (mcg) of folic acid.  If you have trouble pooping (constipation), take medicine that will make your stool soft (stool softener) if your doctor approves. Eating and drinking   Eat regular, healthy meals.  Your doctor will tell you the amount of weight gain that is right for you.  Avoid raw meat and uncooked cheese.  If you feel sick to your stomach (nauseous) or throw up (vomit): ? Eat 4 or 5 small meals a day instead of 3 large meals. ? Try eating a few soda crackers. ? Drink liquids between meals instead of during meals.  To prevent constipation: ? Eat foods that are high in fiber, like fresh fruits and vegetables, whole grains, and beans. ? Drink enough fluids to keep your pee (urine) clear or pale yellow. Activity  Exercise only as told by your doctor. Stop exercising if you have cramps or pain in your lower belly (abdomen) or low back.  Do not exercise if it is too hot, too humid, or if you are in a place of great height (high altitude).  Try to avoid standing for long periods of time. Move your legs often if you must stand in  one place for a long time.  Avoid heavy lifting.  Wear low-heeled shoes. Sit and stand up straight.  You can have sex unless your doctor tells you not to. Relieving pain and discomfort  Wear a good support bra if your breasts are sore.  Take warm water baths (sitz baths) to soothe pain or discomfort caused by hemorrhoids. Use hemorrhoid cream if your doctor says it is okay.  Rest with your legs raised if you have leg cramps or low back pain.  If you have puffy, bulging veins (varicose veins) in your legs: ? Wear support hose or compression stockings as told by your doctor. ? Raise (elevate) your feet for 15 minutes, 3-4 times a day. ? Limit salt in your food. Prenatal care  Schedule your prenatal visits by the twelfth week of pregnancy.  Write down your questions. Take them to your prenatal visits.  Keep all your prenatal visits as told by your doctor. This is important. Safety  Wear your seat belt at all times when driving.  Make a list of emergency phone numbers. The list should include numbers for family, friends, the hospital, and police and fire departments. General instructions  Ask your doctor for a referral to a local prenatal class. Begin classes no later than at the start of month 6 of your pregnancy.  Ask for help if you need counseling or if you need help with nutrition. Your doctor can give  you advice or tell you where to go for help.  Do not use hot tubs, steam rooms, or saunas.  Do not douche or use tampons or scented sanitary pads.  Do not cross your legs for long periods of time.  Avoid all herbs and alcohol. Avoid drugs that are not approved by your doctor.  Do not use any tobacco products, including cigarettes, chewing tobacco, and electronic cigarettes. If you need help quitting, ask your doctor. You may get counseling or other support to help you quit.  Avoid cat litter boxes and soil used by cats. These carry germs that can cause birth defects in  the baby and can cause a loss of your baby (miscarriage) or stillbirth.  Visit your dentist. At home, brush your teeth with a soft toothbrush. Be gentle when you floss. Contact a doctor if:  You are dizzy.  You have mild cramps or pressure in your lower belly.  You have a nagging pain in your belly area.  You continue to feel sick to your stomach, you throw up, or you have watery poop (diarrhea).  You have a bad smelling fluid coming from your vagina.  You have pain when you pee (urinate).  You have increased puffiness (swelling) in your face, hands, legs, or ankles. Get help right away if:  You have a fever.  You are leaking fluid from your vagina.  You have spotting or bleeding from your vagina.  You have very bad belly cramping or pain.  You gain or lose weight rapidly.  You throw up blood. It may look like coffee grounds.  You are around people who have [place] measles, fifth disease, or chickenpox.  You have a very bad headache.  You have shortness of breath.  You have any kind of trauma, such as from a fall or a car accident. Summary  The first trimester of pregnancy is from week 1 until the end of week 13 (months 1 through 3).  To take care of yourself and your unborn baby, you will need to eat healthy meals, take medicines only if your doctor tells you to do so, and do activities that are safe for you and your baby.  Keep all follow-up visits as told by your doctor. This is important as your doctor will have to ensure that your baby is healthy and growing well. This information is not intended to replace advice given to you by your health care provider. Make sure you discuss any questions you have with your health care provider. Document Revised: 05/20/2018 Document Reviewed: 02/05/2016 Elsevier Patient Education  2020 [employer].

## 2019-12-09 NOTE — MAU Note (Signed)
Just found out that she was pregnant, +HPT.  Is cramping really bad.  Hx of high risk pregnancies, has had 2 miscarriages. No bleeding.

## 2019-12-12 LAB — GC/CHLAMYDIA PROBE AMP (~~LOC~~) NOT AT ARMC
Chlamydia: NEGATIVE
Comment: NEGATIVE
Comment: NORMAL
Neisseria Gonorrhea: NEGATIVE

## 2019-12-18 ENCOUNTER — Encounter (HOSPITAL_COMMUNITY): Payer: Self-pay | Admitting: Obstetrics and Gynecology

## 2019-12-18 ENCOUNTER — Other Ambulatory Visit: Payer: Self-pay

## 2019-12-18 ENCOUNTER — Inpatient Hospital Stay (HOSPITAL_COMMUNITY)
Admission: AD | Admit: 2019-12-18 | Discharge: 2019-12-18 | Disposition: A | Discharge status: Home / Self Care | Payer: 59 | Attending: Obstetrics and Gynecology | Admitting: Obstetrics and Gynecology

## 2019-12-18 ENCOUNTER — Inpatient Hospital Stay (HOSPITAL_COMMUNITY): Payer: 59

## 2019-12-18 DIAGNOSIS — O26891 Other specified pregnancy related conditions, first trimester: Secondary | ICD-10-CM | POA: Diagnosis not present

## 2019-12-18 DIAGNOSIS — R109 Unspecified abdominal pain: Secondary | ICD-10-CM | POA: Diagnosis not present

## 2019-12-18 DIAGNOSIS — Z79899 Other long term (current) drug therapy: Secondary | ICD-10-CM | POA: Insufficient documentation

## 2019-12-18 DIAGNOSIS — O209 Hemorrhage in early pregnancy, unspecified: Secondary | ICD-10-CM | POA: Diagnosis not present

## 2019-12-18 DIAGNOSIS — Z3490 Encounter for supervision of normal pregnancy, unspecified, unspecified trimester: Secondary | ICD-10-CM

## 2019-12-18 DIAGNOSIS — O26851 Spotting complicating pregnancy, first trimester: Secondary | ICD-10-CM

## 2019-12-18 DIAGNOSIS — Z3A01 Less than 8 weeks gestation of pregnancy: Secondary | ICD-10-CM | POA: Diagnosis not present

## 2019-12-18 DIAGNOSIS — N939 Abnormal uterine and vaginal bleeding, unspecified: Secondary | ICD-10-CM

## 2019-12-18 LAB — CBC
HCT: 34.6 % — ABNORMAL LOW (ref 36.0–46.0)
Hemoglobin: 11.6 g/dL — ABNORMAL LOW (ref 12.0–15.0)
MCH: 30.6 pg (ref 26.0–34.0)
MCHC: 33.5 g/dL (ref 30.0–36.0)
MCV: 91.3 fL (ref 80.0–100.0)
Platelets: 263 10*3/uL (ref 150–400)
RBC: 3.79 MIL/uL — ABNORMAL LOW (ref 3.87–5.11)
RDW: 12.7 % (ref 11.5–15.5)
WBC: 5.9 10*3/uL (ref 4.0–10.5)
nRBC: 0 % (ref 0.0–0.2)

## 2019-12-18 LAB — HCG, QUANTITATIVE, PREGNANCY: hCG, Beta Chain, Quant, S: 29607 m[IU]/mL — ABNORMAL HIGH (ref <5)

## 2019-12-18 NOTE — Discharge Instructions (Signed)
Prenatal Care Providers           [facility] for [facility] @ [facility]  [address] [phone]  Center for [facility] @ [facility]   [address]  [phone]  [facility] @ [facility]       [address] [phone]            [facility] for [facility] @ [facility]     [phone]          [facility] for [facility] @ [employer]   [address] [phone]  [facility] for [facility] @ [facility]  [address] [phone]     [facility] for [facility] @ [facility] [address] [place])  [address] [place]   [phone]     [facility]  Phone: [phone]  [facility]  Phone: [phone]  [facility] Phone: [phone]  [facility] Phone: [phone]  [facility] Phone: [phone]  [facility] Phone: [phone]  [facility]  Phone: [phone]  Safe Medications in Pregnancy   Acne: Benzoyl Peroxide Salicylic Acid  Backache/Headache: Tylenol: 2 regular strength every 4 hours OR              2 Extra strength every 6 hours  Colds/Coughs/Allergies: Benadryl (alcohol free) 25 mg every 6 hours as needed Breath right strips Claritin Cepacol throat lozenges Chloraseptic throat spray Cold-Eeze- up to three times per day Cough drops, alcohol free Flonase (by prescription only) Guaifenesin Mucinex Robitussin DM (plain only, alcohol free) Saline nasal spray/drops Sudafed (pseudoephedrine) & Actifed ** use only after [redacted] weeks gestation and if you do not have high blood pressure Tylenol Vicks Vaporub Zinc lozenges Zyrtec   Constipation: Colace Ducolax suppositories Fleet enema Glycerin suppositories Metamucil Milk of magnesia Miralax Senokot Smooth move tea  Diarrhea: Kaopectate Imodium A-D  *NO pepto  Bismol  Hemorrhoids: Anusol Anusol HC Preparation H Tucks  Indigestion: Tums Maalox Mylanta Zantac  Pepcid  Insomnia: Benadryl (alcohol free) 25mg  every 6 hours as needed Tylenol PM Unisom, no Gelcaps  Leg Cramps: Tums MagGel  Nausea/Vomiting:  Bonine Dramamine Emetrol Ginger extract Sea bands Meclizine  Nausea medication to take during pregnancy:  Unisom (doxylamine succinate 25 mg tablets) Take one tablet daily at bedtime. If symptoms are not adequately controlled, the dose can be increased to a maximum recommended dose of two tablets daily (1/2 tablet in the morning, 1/2 tablet mid-afternoon and one at bedtime). Vitamin B6 100mg  tablets. Take one tablet twice a day (up to 200 mg per day).  Skin Rashes: Aveeno products Benadryl cream or 25mg  every 6 hours as needed Calamine Lotion 1% cortisone cream  Yeast infection: Gyne-lotrimin 7 Monistat 7   **If taking multiple medications, please check labels to avoid duplicating the same active ingredients **take medication as directed on the label ** Do not exceed 4000 mg of tylenol in 24 hours **Do not take medications that contain aspirin or ibuprofen     Vaginal Bleeding During Pregnancy, First Trimester  A small amount of bleeding (spotting) from the vagina is common during early pregnancy. Sometimes the bleeding is normal and does not cause problems. At other times, though, bleeding may be a sign of something serious. Tell your doctor about any bleeding from your vagina right away. Follow these instructions at home: Activity  Follow your doctor's instructions about how active you can  be.  If needed, make plans for someone to help with your normal activities.  Do not have sex or orgasms until your doctor says that this is safe. General instructions  Take over-the-counter and prescription medicines only as told by your doctor.  Watch your condition for any changes.  Write down: ? The number of pads  you use each day. ? How often you change pads. ? How soaked (saturated) your pads are.  Do not use tampons.  Do not douche.  If you pass any tissue from your vagina, save it to show to your doctor.  Keep all follow-up visits as told by your doctor. This is important. Contact a doctor if:  You have vaginal bleeding at any time while you are pregnant.  You have cramps.  You have a fever. Get help right away if:  You have very bad cramps in your back or belly (abdomen).  You pass large clots or a lot of tissue from your vagina.  Your bleeding gets worse.  You feel light-headed.  You feel weak.  You pass out (faint).  You have chills.  You are leaking fluid from your vagina.  You have a gush of fluid from your vagina. Summary  Sometimes vaginal bleeding during pregnancy is normal and does not cause problems. At other times, bleeding may be a sign of something serious.  Tell your doctor about any bleeding from your vagina right away.  Follow your doctor's instructions about how active you can be. You may need someone to help you with your normal activities. This information is not intended to replace advice given to you by your health care provider. Make sure you discuss any questions you have with your health care provider. Document Revised: 05/18/2018 Document Reviewed: 04/30/2016 Elsevier Patient Education  2020 [employer].

## 2019-12-18 NOTE — MAU Note (Signed)
..  Alisha Edwards is a 29 y.o. at [redacted]w[redacted]d here in MAU reporting: vaginal bleeding and abdominal cramping. Pt reports she used the restroom and passed "some tissue." As well as light pink when she wipes.  Shortly after this she began to have constant abdominal cramping.  Pt is also reporting vaginal irritation and white discharge.  Pain score: 8/10 Vitals:   12/18/19 2131  BP: (!) 103/58  Pulse: 69  Resp: 15  Temp: 98.5 F (36.9 C)  SpO2: 100%

## 2019-12-18 NOTE — MAU Provider Note (Signed)
History     CSN: [id number]  Arrival date and time: 12/18/19 2112  First Provider Initiated Contact with Patient 12/18/19 2135      Chief Complaint  Patient presents with  . Vaginal Bleeding  . Abdominal Pain   HPI Alisha Edwards is a 29 y.o. G3P1011 at [redacted]w[redacted]d who presents to MAU with chief complaint of vaginal spotting. This is a new problem, onset today at about 8pm. Patient endorses passing tissue "like mesh" when she wiped after voiding. She denies heavy vaginal bleeding. She has not needed to don a pad.   Patient was evaluated in MAU on 12/09/2019 for report of abdominal cramping. She reports she has not experienced cramping since that time. She also denies urinary complaints, abdominal tenderness, fever or recent illness. She is remote from sexual intercourse.  OB History    Gravida  3   Para  1   Term  1   Preterm      AB  1   Living  1     SAB  1   TAB      Ectopic      Multiple      Live Births  1           Past Medical History:  Diagnosis Date  . HSV (herpes simplex virus) anogenital infection   . MRSA (methicillin resistant staph aureus) culture positive 2009   thigh  . STD (sexually transmitted disease)    Chlamydia    Past Surgical History:  Procedure Laterality Date  . MOUTH SURGERY    . nasal surgery      Family History  Problem Relation Age of Onset  . Hypertension Maternal Grandmother   . Breast cancer Maternal Grandmother        Age 8's  . Breast cancer Paternal Grandmother        Age 58'S    Social History   Tobacco Use  . Smoking status: Never Smoker  . Smokeless tobacco: Never Used  Vaping Use  . Vaping Use: Never used  Substance Use Topics  . Alcohol use: Yes    Alcohol/week: 0.0 standard drinks    Comment: rarely  . Drug use: No    Allergies:  Allergies  Allergen Reactions  . Shellfish Allergy Itching and Swelling    Mouth swells and itches    Medications Prior to Admission  Medication Sig Dispense  Refill Last Dose  . ALPRAZolam (XANAX) 0.5 MG tablet Take 0.5 mg by mouth at bedtime as needed for anxiety.     Marland Kitchen escitalopram (LEXAPRO) 20 MG tablet Take 20 mg by mouth daily.     . Prenatal Vit-Fe Fumarate-FA (PREPLUS) 27-1 MG TABS Take 1 tablet by mouth daily. 30 tablet 13   . valACYclovir (VALTREX) 500 MG tablet take 1 tablet by mouth once daily 90 tablet 4 Unknown at Unknown time    Review of Systems  Genitourinary: Positive for vaginal bleeding.  All other systems reviewed and are negative.  Physical Exam   Blood pressure (!) 103/58, pulse 69, temperature 98.5 F (36.9 C), temperature source Oral, resp. rate 15, height 5\' 4"  (1.626 m), weight 61.2 kg, last menstrual period 10/27/2019, SpO2 100 %, unknown if currently breastfeeding.  Physical Exam Vitals and nursing note reviewed. Exam conducted with a chaperone present.  Constitutional:      General: She is not in acute distress.    Appearance: She is not ill-appearing or toxic-appearing.  Cardiovascular:     Rate  and Rhythm: Normal rate.  Pulmonary:     Effort: Pulmonary effort is normal.     Breath sounds: Normal breath sounds.  Abdominal:     General: Abdomen is flat.     Tenderness: There is no abdominal tenderness. There is no right CVA tenderness or left CVA tenderness.  Skin:    General: Skin is warm.     Capillary Refill: Capillary refill takes less than 2 seconds.  Neurological:     General: No focal deficit present.     Mental Status: She is alert.  Psychiatric:        Mood and Affect: Mood normal.        Behavior: Behavior normal.     MAU Course  Procedures  Orders Placed This Encounter  Procedures  . US OB Transvaginal    Standing Status:   Standing    Number of Occurrences:   1    Order Specific Question:   Reason for Exam (SYMPTOM  OR DIAGNOSIS REQUIRED)    Answer:   vaginal bleeding and cramping  . CBC    Standing Status:   Standing    Number of Occurrences:   1  . hCG, quantitative,  pregnancy    Standing Status:   Standing    Number of Occurrences:   1   Results for orders placed or performed during the hospital encounter of 12/18/19 (from the past 24 hour(s))  CBC     Status: Abnormal   Collection Time: 12/18/19 10:02 PM  Result Value Ref Range   WBC 5.9 4.0 - 10.5 K/uL   RBC 3.79 (L) 3.87 - 5.11 MIL/uL   Hemoglobin 11.6 (L) 12.0 - 15.0 g/dL   HCT 09.8 (L) 36 - 46 %   MCV 91.3 80.0 - 100.0 fL   MCH 30.6 26.0 - 34.0 pg   MCHC 33.5 30.0 - 36.0 g/dL   RDW 11.9 14.7 - 82.9 %   Platelets 263 150 - 400 K/uL   nRBC 0.0 0.0 - 0.2 %   US OB Transvaginal  Result Date: 12/18/2019 CLINICAL DATA:  Initial evaluation for acute vaginal bleeding, cramping. Early pregnancy. EXAM: TRANSVAGINAL OB ULTRASOUND TECHNIQUE: Transvaginal ultrasound was performed for complete evaluation of the gestation as well as the maternal uterus, adnexal regions, and pelvic cul-de-sac. COMPARISON:  Prior ultrasound from 12/09/2019. FINDINGS: Intrauterine gestational sac: Single Yolk sac:  Present Embryo:  Present Cardiac Activity: Present Heart Rate: 122 bpm CRL: 3.9 mm   6 w 0 d                  [place] EDC: 08/12/2020 Subchorionic hemorrhage:  None visualized. Maternal uterus/adnexae: Ovaries within normal limits. Corpus luteal cyst noted on right. No free fluid. IMPRESSION: 1. Single viable intrauterine pregnancy as above, estimated gestational age [redacted] weeks and 0 days by crown-rump length, with ultrasound EDC of 08/12/2020. No complication. 2. No other acute maternal uterine or adnexal abnormality. Electronically Signed   By: Rise Mu M.D.   On: 12/18/2019 22:34   Assessment and Plan  --29 y.o. G3P1011 with confirmed SIUP at [redacted]w[redacted]d  --Scant vaginal spotting, blood type A POS --Discharge home in stable condition  F/U: --Desired pregnancy, pt to initiate prenatal care. Given list of Providers per patient request   Calvert Cantor, CNM 12/18/2019, 11:06 PM

## 2019-12-21 ENCOUNTER — Telehealth: Payer: Self-pay | Admitting: *Deleted

## 2019-12-21 NOTE — Telephone Encounter (Signed)
Received message from Magnus Ivan, RT regarding question of need for pt [place] and lab appts on 11/11. Per chart review, pt was seen @ MAU on 11/7 wherein she was found to have appropriately rising BHCG level. She also had [place] which showed fetal embryo with HR 122 and CRL consistent with [redacted]w[redacted]d EGA. I called pt and reviewed results from MAU visit on 11/7 and informed her that the appts for tomorrow are not needed and would be cancelled. Pt was encouraged to choose a location for prenatal care from the list provided on 11/7 and to schedule her appointment. Pt voiced understanding of all information and instructions given.

## 2019-12-22 ENCOUNTER — Ambulatory Visit: Admission: RE | Admit: 2019-12-22 | Payer: 59 | Source: Ambulatory Visit

## 2019-12-22 ENCOUNTER — Other Ambulatory Visit: Payer: 59

## 2020-01-06 ENCOUNTER — Other Ambulatory Visit: Payer: Self-pay

## 2020-01-06 ENCOUNTER — Inpatient Hospital Stay (HOSPITAL_COMMUNITY)
Admission: AD | Admit: 2020-01-06 | Discharge: 2020-01-06 | Disposition: A | Discharge status: Home / Self Care | Payer: Medicaid Other | Attending: Family Medicine | Admitting: Family Medicine

## 2020-01-06 ENCOUNTER — Encounter (HOSPITAL_COMMUNITY): Payer: Self-pay | Admitting: Family Medicine

## 2020-01-06 DIAGNOSIS — O98511 Other viral diseases complicating pregnancy, first trimester: Secondary | ICD-10-CM | POA: Diagnosis not present

## 2020-01-06 DIAGNOSIS — O26891 Other specified pregnancy related conditions, first trimester: Secondary | ICD-10-CM | POA: Diagnosis not present

## 2020-01-06 DIAGNOSIS — R519 Headache, unspecified: Secondary | ICD-10-CM | POA: Insufficient documentation

## 2020-01-06 DIAGNOSIS — G44209 Tension-type headache, unspecified, not intractable: Secondary | ICD-10-CM

## 2020-01-06 DIAGNOSIS — Z79899 Other long term (current) drug therapy: Secondary | ICD-10-CM | POA: Insufficient documentation

## 2020-01-06 DIAGNOSIS — O99351 Diseases of the nervous system complicating pregnancy, first trimester: Secondary | ICD-10-CM | POA: Diagnosis not present

## 2020-01-06 DIAGNOSIS — Z3A08 8 weeks gestation of pregnancy: Secondary | ICD-10-CM | POA: Diagnosis not present

## 2020-01-06 LAB — URINALYSIS, ROUTINE W REFLEX MICROSCOPIC
Bilirubin Urine: NEGATIVE
Glucose, UA: NEGATIVE mg/dL
Hgb urine dipstick: NEGATIVE
Ketones, ur: NEGATIVE mg/dL
Leukocytes,Ua: NEGATIVE
Nitrite: NEGATIVE
Protein, ur: NEGATIVE mg/dL
Specific Gravity, Urine: 1.019 (ref 1.005–1.030)
pH: 6 (ref 5.0–8.0)

## 2020-01-06 MED ORDER — DIPHENHYDRAMINE HCL 50 MG/ML IJ SOLN
25.0000 mg | Freq: Once | INTRAMUSCULAR | Status: AC
  Administered 2020-01-06: 25 mg via INTRAVENOUS
  Filled 2020-01-06: qty 1

## 2020-01-06 MED ORDER — DEXAMETHASONE SODIUM PHOSPHATE 10 MG/ML IJ SOLN
10.0000 mg | Freq: Once | INTRAMUSCULAR | Status: AC
  Administered 2020-01-06: 10 mg via INTRAVENOUS
  Filled 2020-01-06: qty 1

## 2020-01-06 MED ORDER — PROMETHAZINE HCL 25 MG/ML IJ SOLN
12.5000 mg | Freq: Once | INTRAMUSCULAR | Status: AC
  Administered 2020-01-06: 12.5 mg via INTRAVENOUS
  Filled 2020-01-06: qty 1

## 2020-01-06 MED ORDER — DEXAMETHASONE SODIUM PHOSPHATE 10 MG/ML IJ SOLN: 10.0000 mg | Freq: Once | INTRAMUSCULAR | Status: DC

## 2020-01-06 MED ORDER — SODIUM CHLORIDE 0.9 % IV SOLN
500.0000 mL | Freq: Once | INTRAVENOUS | Status: AC
  Administered 2020-01-06: 500 mL via INTRAVENOUS

## 2020-01-06 MED ORDER — SODIUM CHLORIDE 0.9 % IV SOLN
INTRAVENOUS | Status: DC
Start: 2020-01-06 — End: 2020-01-06

## 2020-01-06 MED ORDER — LACTATED RINGERS IV BOLUS: 500.0000 mL | Freq: Once | INTRAVENOUS | Status: DC

## 2020-01-06 MED ORDER — DIPHENHYDRAMINE HCL 50 MG/ML IJ SOLN
25.0000 mg | Freq: Once | INTRAMUSCULAR | Status: DC
Start: 2020-01-06 — End: 2020-01-06

## 2020-01-06 NOTE — MAU Note (Signed)
Pt stated she has had a headache off and on for a week. Has been non stop the last 3 days. Took tylenol without relief. Does have history of migraines but stated it does not feel like a migraine feels lie a sinus headache. Feels pressure behind her eyes.

## 2020-01-06 NOTE — Discharge Instructions (Signed)
Sinus Headache  A sinus headache occurs when your sinuses become clogged or swollen. Sinuses are air-filled spaces in your skull that are behind the bones of your face and forehead. Sinus headaches can range from mild to severe. What are the causes? A sinus headache can result from various conditions that affect the sinuses. Common causes include:  Colds.  Sinus infections.  Allergies. Many people confuse sinus headaches with migraines or tension headaches because those headaches can also cause facial pain and nasal symptoms. What are the signs or symptoms? The main symptom of this condition is a headache that may feel like pain or pressure in your face, forehead, ears, or upper teeth. People who have a sinus headache often have other symptoms, such as:  Congested or runny nose.  Fever.  Inability to smell. Weather changes can make symptoms worse. How is this diagnosed? This condition may be diagnosed based on:  A physical exam and medical history.  Imaging tests, such as a CT scan or MRI, to check for problems with the sinuses.  Examination of the sinuses using a thin tool with a camera that is inserted through your nose (endoscopy). How is this treated? Treatment for this condition depends on the cause.  Sinus pain that is caused by a sinus infection may be treated with antibiotic medicine.  Sinus pain that is caused by allergies may be helped by allergy medicines (antihistamines) and medicated nasal sprays.  Sinus pain that is caused by congestion may be helped by rinsing out (flushing) the nose and sinuses with saline solution.  Sinus surgery may be needed in some cases if other treatments do not help. Follow these instructions at home: General instructions  If directed: ? Apply a warm, moist washcloth to your face to help relieve pain. ? Use a nasal saline wash. Medicines   Take over-the-counter and prescription medicines only as told by your health care  provider.  If you were prescribed an antibiotic medicine, take it as told by your health care provider. Do not stop taking the antibiotic even if you start to feel better.  If you have congestion, use a nasal spray to help lessen pressure. Hydrate and humidify  Drink enough water to keep your urine clear or pale yellow. Staying hydrated will help to thin your mucus.  Use a cool mist humidifier to keep the humidity level in your home above 50%.  Inhale steam for 10-15 minutes, 3-4 times a day or as told by your health care provider. You can do this in the bathroom while a hot shower is running.  Limit your exposure to cool or dry air. Contact a health care provider if:  You have a headache more than one time a week.  You have sensitivity to light or sound.  You develop a fever.  You feel nauseous or you vomit.  Your headaches do not get better with treatment. Many people think that they have a sinus headache when they actually have a migraine or a tension headache. Get help right away if:  You have vision problems.  You have sudden, severe pain in your face or head.  You have a seizure.  You are confused.  You have a stiff neck. Summary  A sinus headache occurs when your sinuses become clogged or swollen.  A sinus headache can result from various conditions that affect the sinuses, such as a cold, a sinus infection, or an allergy.  Treatment for this condition depends on the cause. It may include   medicine, such as antibiotics or antihistamines. This information is not intended to replace advice given to you by your health care provider. Make sure you discuss any questions you have with your health care provider. Document Revised: 01/09/2017 Document Reviewed: 11/07/2016 Elsevier Patient Education  2020 Else[employer] Inc.  

## 2020-01-06 NOTE — MAU Provider Note (Signed)
Chief Complaint:  Headache   First Provider Initiated Contact with Patient 01/06/20 1059     HPI: Alisha Edwards is a 29 y.o. G3P1011 at [redacted]w[redacted]d who presents to maternity admissions reporting a severe headache for the past 3 days, unresponsive to Tylenol. The headache has been going on for two weeks, off and on but became more severe 3 days ago. She also complains of mild stuffiness (esp upon waking because she sleeps under a fan) and describes the headache as behind her eyes "feels like my sinuses." Denies vaginal bleeding, leaking of fluid, decreased fetal movement, fever, falls, or recent illness.   Past Medical History:  Diagnosis Date  . HSV (herpes simplex virus) anogenital infection   . MRSA (methicillin resistant staph aureus) culture positive 2009   thigh  . STD (sexually transmitted disease)    Chlamydia   OB History  Gravida Para Term Preterm AB Living  3 1 1   1 1   SAB TAB Ectopic Multiple Live Births  1       1    # Outcome Date GA Lbr Len/2nd Weight Sex Delivery Anes PTL Lv  3 Current           2 Term 01/25/11 [redacted]w[redacted]d 11:01 / 01:21 7 lb 1.6 oz (3.221 kg) F Vag-Spont EPI  LIV  1 SAB            Past Surgical History:  Procedure Laterality Date  . MOUTH SURGERY    . nasal surgery     Family History  Problem Relation Age of Onset  . Hypertension Maternal Grandmother   . Breast cancer Maternal Grandmother        Age 61's  . Breast cancer Paternal Grandmother        Age 37'S   Social History   Tobacco Use  . Smoking status: Never Smoker  . Smokeless tobacco: Never Used  Vaping Use  . Vaping Use: Never used  Substance Use Topics  . Alcohol use: Yes    Alcohol/week: 0.0 standard drinks    Comment: rarely  . Drug use: No   Allergies  Allergen Reactions  . Shellfish Allergy Itching and Swelling    Mouth swells and itches   No medications prior to admission.    I have reviewed patient's Past Medical Hx, Surgical Hx, Family Hx, Social Hx, medications and  allergies.   ROS:  Review of Systems  Constitutional: Negative for fatigue and fever.  HENT: Positive for congestion (mild stuffiness). Negative for sinus pressure and sinus pain.   Eyes: Positive for photophobia. Negative for visual disturbance.  Respiratory: Negative for cough and shortness of breath.   Gastrointestinal: Positive for nausea (some mild from the pain). Negative for vomiting.  Genitourinary: Negative for vaginal bleeding, vaginal discharge and vaginal pain.  Neurological: Positive for headaches. Negative for dizziness, syncope, weakness and light-headedness.  All other systems reviewed and are negative.   Physical Exam   Patient Vitals for the past 24 hrs:  BP Temp Pulse Resp SpO2 Height Weight  01/06/20 1327 (!) 100/58 -- 75 -- -- -- --  01/06/20 1050 (!) 111/56 -- 88 18 -- -- --  01/06/20 1028 (!) 106/56 98.3 F (36.8 C) 81 18 99 % 5\' 4"  (1.626 m) 132 lb (59.9 kg)   Constitutional: Well-developed, well-nourished female in no acute distress.  Cardiovascular: normal rate & rhythm, no murmur Respiratory: normal effort, lung sounds clear throughout GI: Abd soft, non-tender, gravid appropriate for gestational age. Pos BS  x 4 MS: Extremities nontender, no edema, normal ROM Neurologic: Alert and oriented x 4.  Pelvic exam deferred    Labs: Results for orders placed or performed during the hospital encounter of 01/06/20 (from the past 24 hour(s))  Urinalysis, Routine w reflex microscopic Urine, Clean Catch     Status: Abnormal   Collection Time: 01/06/20 10:45 AM  Result Value Ref Range   Color, Urine YELLOW YELLOW   APPearance HAZY (A) CLEAR   Specific Gravity, Urine 1.019 1.005 - 1.030   pH 6.0 5.0 - 8.0   Glucose, UA NEGATIVE NEGATIVE mg/dL   Hgb urine dipstick NEGATIVE NEGATIVE   Bilirubin Urine NEGATIVE NEGATIVE   Ketones, ur NEGATIVE NEGATIVE mg/dL   Protein, ur NEGATIVE NEGATIVE mg/dL   Nitrite NEGATIVE NEGATIVE   Leukocytes,Ua NEGATIVE NEGATIVE     Imaging:  No results found.   MAU Course: Orders Placed This Encounter  Procedures  . Urinalysis, Routine w reflex microscopic Urine, Clean Catch  . Discharge patient   Meds ordered this encounter  Medications  . DISCONTD: 0.9 %  sodium chloride infusion  . DISCONTD: diphenhydrAMINE (BENADRYL) injection 25 mg  . DISCONTD: dexamethasone (DECADRON) injection 10 mg  . promethazine (PHENERGAN) injection 12.5 mg  . DISCONTD: lactated ringers bolus 500 mL  . AND Linked Order Group   . 0.9 %  sodium chloride infusion   . diphenhydrAMINE (BENADRYL) injection 25 mg   . dexamethasone (DECADRON) injection 10 mg    MDM: Headache cocktail given with good relief of headache Discussed home remedies to prevent additional sinus headaches such as nightly nasal rinse with hypertonic saline, cool mist humidifier at the bedside, and proper hydration. Pt verbalized understanding and amenable to plan.  Assessment: 1. Acute non intractable tension-type headache    Plan: Discharge home in stable condition with return precautions.  Will establish routine OB care    Allergies as of 01/06/2020      Reactions   Shellfish Allergy Itching, Swelling   Mouth swells and itches      Medication List    TAKE these medications   PrePLUS 27-1 MG Tabs Take 1 tablet by mouth daily.   valACYclovir 500 MG tablet Commonly known as: VALTREX take 1 tablet by mouth once daily     ASK your doctor about these medications   ALPRAZolam 0.5 MG tablet Commonly known as: XANAX Take 0.5 mg by mouth at bedtime as needed for anxiety.   escitalopram 20 MG tablet Commonly known as: LEXAPRO Take 20 mg by mouth daily.      Edd Arbour, CNM, MSN, [facility] 01/06/20 8:12 PM

## 2020-01-11 DIAGNOSIS — Z419 Encounter for procedure for purposes other than remedying health state, unspecified: Secondary | ICD-10-CM | POA: Diagnosis not present

## 2020-01-16 ENCOUNTER — Ambulatory Visit: Payer: Medicaid Other | Attending: Internal Medicine

## 2020-01-16 DIAGNOSIS — Z23 Encounter for immunization: Secondary | ICD-10-CM

## 2020-01-16 NOTE — Progress Notes (Signed)
   Covid-19 Vaccination Clinic  Name:  MAILA DUKES    MRN: [id number] DOB: [birth year]  01/16/2020  Ms. Nabi was observed post Covid-19 immunization for 15 minutes without incident. She was provided with Vaccine Information Sheet and instruction to access the V-Safe system.   Ms. Pflum was instructed to call 911 with any severe reactions post vaccine: Marland Kitchen Difficulty breathing  . Swelling of face and throat  . A fast heartbeat  . A bad rash all over body  . Dizziness and weakness   Immunizations Administered    Name Date Dose VIS Date Route   Pfizer COVID-19 Vaccine 01/16/2020  2:58 PM 0.3 mL 11/30/2019 Intramuscular   Manufacturer: [employer]   Lot: [id number]   NDC: [address]-0

## 2020-02-11 DIAGNOSIS — Z419 Encounter for procedure for purposes other than remedying health state, unspecified: Secondary | ICD-10-CM | POA: Diagnosis not present

## 2020-03-13 DIAGNOSIS — Z419 Encounter for procedure for purposes other than remedying health state, unspecified: Secondary | ICD-10-CM | POA: Diagnosis not present

## 2020-04-10 DIAGNOSIS — Z419 Encounter for procedure for purposes other than remedying health state, unspecified: Secondary | ICD-10-CM | POA: Diagnosis not present

## 2020-05-09 ENCOUNTER — Ambulatory Visit (INDEPENDENT_AMBULATORY_CARE_PROVIDER_SITE_OTHER): Payer: 59 | Admitting: Obstetrics and Gynecology

## 2020-05-09 ENCOUNTER — Encounter: Payer: Self-pay | Admitting: Obstetrics and Gynecology

## 2020-05-09 ENCOUNTER — Other Ambulatory Visit: Payer: Self-pay

## 2020-05-09 VITALS — BP 100/62 | HR 90 | Ht 64.0 in | Wt 131.4 lb

## 2020-05-09 DIAGNOSIS — K5900 Constipation, unspecified: Secondary | ICD-10-CM | POA: Diagnosis not present

## 2020-05-09 DIAGNOSIS — Z3009 Encounter for other general counseling and advice on contraception: Secondary | ICD-10-CM

## 2020-05-09 DIAGNOSIS — N941 Unspecified dyspareunia: Secondary | ICD-10-CM

## 2020-05-09 DIAGNOSIS — M6289 Other specified disorders of muscle: Secondary | ICD-10-CM | POA: Diagnosis not present

## 2020-05-09 DIAGNOSIS — N946 Dysmenorrhea, unspecified: Secondary | ICD-10-CM

## 2020-05-09 DIAGNOSIS — N939 Abnormal uterine and vaginal bleeding, unspecified: Secondary | ICD-10-CM

## 2020-05-09 LAB — PREGNANCY, URINE: Preg Test, Ur: NEGATIVE

## 2020-05-09 MED ORDER — NAPROXEN SODIUM 550 MG PO TABS
550.0000 mg | ORAL_TABLET | Freq: Two times a day (BID) | ORAL | 2 refills | Status: DC
Start: 2020-05-09 — End: 2020-07-05

## 2020-05-09 MED ORDER — NORETHIN ACE-ETH ESTRAD-FE 1-20 MG-MCG PO TABS: 1.0000 | ORAL_TABLET | Freq: Every day | ORAL | 0 refills | Status: DC

## 2020-05-09 MED ORDER — MEDROXYPROGESTERONE ACETATE 10 MG PO TABS: ORAL_TABLET | ORAL | 0 refills | Status: DC

## 2020-05-09 NOTE — Progress Notes (Signed)
30 y.o. [id number] Single Black or African American Not Hispanic or Latino female here for irregular periods. She states that she had an abortion  In December. She states that she has been bleeding heavy during her cycle, feels like she is going to pass a clot but never does. She says that last night she was having severe cramping from 3-6am  Period Pattern: (!) Irregular Menstrual Flow: Heavy Menstrual Control: Thin pad,Tampon Menstrual Control Change Freq (Hours): 1 Dysmenorrhea Symptoms: Cramping,Headache   She had an EAB in 12/21 at 9 weeks.  She got her first cycle in mid February. Bleed x 4 days, light. More like spotting. She didn't bleed again until 13 days ago. She is still bleeding. It was heavy for 7 days, she was saturating a super tampon and a pad in one hour. Currently the bleeding is light. Never light headed or dizzy.  Up until this morning her cramps were mild. This morning between 3-6 am she had severe cramping, no clot was passed. Felt like she was going to pass a large clot.    She hasn't been sexually active since her abortion. Not under a lot of stress.  Prior to her pregnancy last fall her cycles were monthly x 7 days with moderate flow and mild cramps.   Negative STD testing in 10/21, no new risks.   She c/o a long h/o deep dyspareunia, excruciating. Only hurt with one partner (large)  Patient's last menstrual period was 10/13/2019 (approximate).          Sexually active: No.  The current method of family planning is abstinence.    Exercising: Yes.    The patient does not participate in regular exercise at present. Smoker:  no  Health Maintenance: Pap:  06/12/11 normal  History of abnormal Pap:  no MMG:  None  BMD:   none Colonoscopy: none  TDaP:  2017 Gardasil:  One in 2017   reports that she has never smoked. She has never used smokeless tobacco. She reports current alcohol use. She reports that she does not use drugs.  Past Medical History:  Diagnosis Date   . Anemia   . Anxiety   . Depression   . Dysmenorrhea   . Genital warts   . HSV (herpes simplex virus) anogenital infection   . MRSA (methicillin resistant staph aureus) culture positive 2009   thigh  . STD (sexually transmitted disease)    Chlamydia    Past Surgical History:  Procedure Laterality Date  . MOUTH SURGERY    . nasal surgery      Current Outpatient Medications  Medication Sig Dispense Refill  . ALPRAZolam (XANAX) 0.5 MG tablet Take 0.5 mg by mouth at bedtime as needed for anxiety.    . valACYclovir (VALTREX) 500 MG tablet take 1 tablet by mouth once daily 90 tablet 4   No current facility-administered medications for this visit.    Family History  Problem Relation Age of Onset  . Hypertension Maternal Grandmother   . Breast cancer Maternal Grandmother        Age 2's  . Breast cancer Paternal Grandmother        Age 51'S    Review of Systems  Gastrointestinal: Positive for abdominal pain.  Genitourinary: Positive for menstrual problem, pelvic pain and vaginal bleeding.  All other systems reviewed and are negative. No BM x 3 days, has long term issues with constipation  Exam:   BP 100/62   Pulse 90   Ht 5\' 4"  (  1.626 m)   Wt 131 lb 6.4 oz (59.6 kg)   LMP 10/13/2019 (Approximate)   SpO2 100%   Breastfeeding Unknown   BMI 22.55 kg/m   Weight change: @WEIGHTCHANGE @ Height:   Height: 5\' 4"  (162.6 cm)  Ht Readings from Last 3 Encounters:  05/09/20 5\' 4"  (1.626 m)  01/06/20 5\' 4"  (1.626 m)  12/18/19 5\' 4"  (1.626 m)    General appearance: alert, cooperative and appears stated age Head: Normocephalic, without obvious abnormality, atraumatic Neck: no adenopathy, supple, symmetrical, trachea midline and thyroid normal to inspection and palpation Abdomen: soft, tender BLQ, no rebound, no guarding; non distended,  no masses,  no organomegaly Lymph nodes: Cervical, supraclavicular nodes normal. No abnormal inguinal nodes palpated Neurologic: Grossly  normal   Pelvic: External genitalia:  no lesions              Urethra:  normal appearing urethra with no masses, tenderness or lesions              Bartholins and Skenes: normal                 Vagina: normal appearing vagina with normal color and discharge, no lesions. Small amount of brown blood in the vagina              Cervix: no lesions               Bimanual Exam:  Uterus:  anteverted, slightly enlarged, mildly tender, mobile              Adnexa: no masses, mildly tedner on the left                Pelvic floor: tight bilaterally with tenderness bilaterally (4/10)  chaperoned for the exam.  1. Abnormal uterine bleeding H/O EAB in 12/21, presents with prolonged AUB. Suspect anovulatory bleed.  - Pregnancy, urine - CBC - TSH - Ferritin - PELVIS TRANSVAGINAL NON-OB (TV ONLY); Future - medroxyPROGESTERone (PROVERA) 10 MG tablet; After you finish the provera and start bleeding, then start the birth control pills.  Dispense: 10 tablet; Refill: 0 - SURESWAB CT/NG/T. vaginalis  2. Severe menstrual cramps Lasted for 3 hours earlier today. Uterus is mildly tender - naproxen sodium (ANAPROX DS) 550 MG tablet; Take 1 tablet (550 mg total) by mouth 2 (two) times daily with a meal. Take prn cramps  Dispense: 30 tablet; Refill: 2 - 13/07/21 PELVIS TRANSVAGINAL NON-OB (TV ONLY); Future -GC/CT testing sent  3. Dyspareunia, female Deep dyspareunia with her last partner, not positional. She has some pelvic floor tightness and tenderness -Discussed possible referral to PT - PELVIS TRANSVAGINAL NON-OB (TV ONLY); Future  4. Pelvic floor dysfunction Pelvic floor tightness and tenderness -Discussed possible referral to PT  5. General counseling and advice on female contraception No contraindication to OCP's, risks reviewed - norethindrone-ethinyl estradiol (LOESTRIN FE) 1-20 MG-MCG tablet; Take 1 tablet by mouth daily.  Dispense: 3 tablet; Refill: 0 -F/U for annual exam/pill  check in 3 months  6. Constipation, unspecified constipation type Take miralax

## 2020-05-09 NOTE — Patient Instructions (Signed)
Oral Contraception Information Oral contraceptive pills (OCPs) are medicines taken by mouth to prevent pregnancy. They work by:  Preventing the ovaries from releasing eggs.  Thickening mucus in the lower part of the uterus (cervix). This prevents sperm from entering the uterus.  Thinning the lining of the uterus (endometrium). This prevents a fertilized egg from attaching to the endometrium. OCPs are highly effective when taken exactly as prescribed. However, OCPs do not prevent STIs (sexually transmitted infections). Using condoms while on an OCP can help prevent STIs. What happens before starting OCPs? Before you start taking OCPs:  You may have a physical exam, blood test, and Pap test.  Your health care provider will make sure you are a good candidate for oral contraception. OCPs are not a good option for certain women, such as: ? Women who smoke and are older than age 35. ? Women who have or have had certain conditions, such as:  A history of high blood pressure.  Deep vein thrombosis.  Pulmonary embolism.  Stroke.  Cardiovascular disease.  Peripheral vascular disease. Ask your health care provider about the possible side effects of the OCP you may be prescribed. Be aware that it can take 2-3 months for your body to adjust to changes in hormone levels. Types of oral contraception Birth control pills contain the hormones estrogen and progestin (synthetic progesterone) or progestin only. The combination pill This type of pill contains estrogen and progestin hormones.  Conventional contraception pills come in packs of 21 or 28 pills. ? Some packs with 28-day pills contain estrogen and progestin for the first 21-24 days. Hormone-free tablets, called placebos, are taken for the final 4-7 days. You should have menstrual bleeding during the time you take the placebos. ? In packs with 21 tablets, you take no pills for 7 days. Menstrual bleeding occurs during these days. (Some people  prefer taking a pill for 28 days to help establish a routine).  Extended-interval contraception pills come in packs of 91 pills. The first 84 tablets have both estrogen and progestin. The last 7 pills are placebos. Menstrual bleeding occurs during the placebo days. With this schedule, menstrual bleeding happens once every 3 months.  Continuous contraception pills come in packs of 28 pills. All pills in the pack contain estrogen and progestin. With this schedule, regular menstrual bleeding does not happen, but there may be spotting or irregular bleeding. Progestin-only pills This type of pill is often called the mini-pill and contains the progestin hormone only. It comes in packs of 28 pills. In some packs, the last 4 pills are placebos. The pill must be taken at the same time every day. This is very important to prevent pregnancy. Menstrual bleeding may not be regular or predictable.   What are the advantages? Oral contraception provides reliable and continuous contraception if taken as directed. It may treat or decrease symptoms of:  Menstrual period cramps.  Irregular menstrual cycle or bleeding.  Heavy menstrual flow.  Abnormal uterine bleeding.  Acne, depending on the type of pill.  Polycystic ovarian syndrome (POS).  Endometriosis.  Iron deficiency anemia.  Premenstrual symptoms, including severe irritability, depression, or anxiety. It also may:  Reduce the risk of endometrial and ovarian cancer.  Be used as emergency contraception.  Prevent ectopic pregnancies and infections of the fallopian tubes. What can make OCPs less effective? OCPs may be less effective if:  You forget to take the pill every day. For progestin-only pills, it is especially important to take the pill at the   same time each day. Even taking it 3 hours late can increase the risk of pregnancy.  You have a stomach or intestinal disease that reduces your body's ability to absorb the pill.  You take OCPs  with other medicines that make OCPs less effective, such as antibiotics, certain HIV medicines, and some seizure medicines.  You take expired OCPs.  You forget to restart the pill after 7 days of not taking it. This refers to the packs of 21 pills. What are the side effects and risks? OCPs can sometimes cause side effects, such as:  Headache.  Depression.  Trouble sleeping.  Nausea and vomiting.  Breast tenderness.  Irregular bleeding or spotting during the first several months.  Bloating or fluid retention.  Increase in blood pressure. Combination pills may slightly increase the risk of:  Blood clots.  Heart attack.  Stroke. Follow these instructions at home: Follow instructions from your health care provider about how to start taking your first cycle of OCPs. Depending on when you start the pill, you may need to use a backup form of birth control, such as condoms, during the first week. Make sure you know what steps to take if you forget to take the pill. Summary  Oral contraceptive pills (OCPs) are medicines taken by mouth to prevent pregnancy. They are highly effective when taken exactly as prescribed.  OCPs contain a combination of the hormones estrogen and progestin (synthetic progesterone) or progestin only.  Before you start taking the pill, you may have a physical exam, blood test, and Pap test. Your health care provider will make sure you are a good candidate for oral contraception.  The combination pill may come in a 21-day pack, a 28-day pack, or a 91-day pack. Progestin-only pills come in packs of 28 pills.  OCPs can sometimes cause side effects, such as headache, nausea, breast tenderness, or irregular bleeding. This information is not intended to replace advice given to you by your health care provider. Make sure you discuss any questions you have with your health care provider. Document Revised: 10/28/2019 Document Reviewed: 10/06/2019 Elsevier Patient  Education  2021 [employer]  

## 2020-05-10 LAB — CBC
HCT: 41.3 % (ref 35.0–45.0)
Hemoglobin: 13.9 g/dL (ref 11.7–15.5)
MCH: 30.3 pg (ref 27.0–33.0)
MCHC: 33.7 g/dL (ref 32.0–36.0)
MCV: 90.2 fL (ref 80.0–100.0)
MPV: 10 fL (ref 7.5–12.5)
Platelets: 257 10*3/uL (ref 140–400)
RBC: 4.58 10*6/uL (ref 3.80–5.10)
RDW: 12.7 % (ref 11.0–15.0)
WBC: 7.1 10*3/uL (ref 3.8–10.8)

## 2020-05-10 LAB — SURESWAB CT/NG/T. VAGINALIS
C. trachomatis RNA, TMA: NOT DETECTED
N. gonorrhoeae RNA, TMA: NOT DETECTED
Trichomonas vaginalis RNA: NOT DETECTED

## 2020-05-10 LAB — TSH: TSH: 0.48 mIU/L

## 2020-05-10 LAB — FERRITIN: Ferritin: 29 ng/mL (ref 16–154)

## 2020-05-11 ENCOUNTER — Telehealth: Payer: Self-pay | Admitting: *Deleted

## 2020-05-11 DIAGNOSIS — Z419 Encounter for procedure for purposes other than remedying health state, unspecified: Secondary | ICD-10-CM | POA: Diagnosis not present

## 2020-05-11 NOTE — Telephone Encounter (Signed)
Patient called and left message in triage for return call about provera question.

## 2020-05-11 NOTE — Telephone Encounter (Signed)
Patient called back asking question about Provera 10 mg tablet prescribed at OV on 05/09/20. Patient was unsure when she would start bleeding. She has not started the pills yet, I explained she may start bleeding a couple days into starting pills or flow could start end the of the 10 day dose. I told her to call if she does not bled after taking Rx so we can let provider know. Patient verbalized she understands.

## 2020-05-13 ENCOUNTER — Encounter: Payer: Self-pay | Admitting: Obstetrics and Gynecology

## 2020-05-14 ENCOUNTER — Ambulatory Visit: Payer: Medicaid Other | Admitting: Obstetrics and Gynecology

## 2020-05-14 NOTE — Telephone Encounter (Signed)
Spoke with patient regarding benefits for scheduled Ultrasound. Patient acknowledges understanding of information presented. Patient cancelled ultrasound and stated that she would call back to reschedule if she decided to proceed.  Patient also noted that she had sent a MyChart message regarding birth control. Read return message from [place], to patient. Patient agreeable.  Encounter closed.

## 2020-05-16 ENCOUNTER — Other Ambulatory Visit: Payer: Self-pay | Admitting: Obstetrics and Gynecology

## 2020-05-16 DIAGNOSIS — N939 Abnormal uterine and vaginal bleeding, unspecified: Secondary | ICD-10-CM

## 2020-05-17 ENCOUNTER — Other Ambulatory Visit: Payer: 59 | Admitting: Obstetrics and Gynecology

## 2020-05-17 ENCOUNTER — Other Ambulatory Visit: Payer: 59

## 2020-05-22 ENCOUNTER — Telehealth: Payer: Self-pay | Admitting: *Deleted

## 2020-05-22 NOTE — Telephone Encounter (Signed)
When she finished the provera, then her bleeding picked up correct? I believe she had an anovulatory bleed and the heavy bleeding is what is left in her uterus. She should be seen.  If she is really dizzy she should go to the ER. Otherwise she should come in at 2:15. Can we send her to a [facility] or [facility] for stat labs? If so I would like a CBC.

## 2020-05-22 NOTE — Telephone Encounter (Addendum)
Patient said her she bled with Provera,but it was not heavy, the heavy bleeding started with taking the birth control pills.  Patient informed with below note, reports she will go to ER, she is not able to come in the office at 2:15pm today because she has to pickup her child. Patient promised she would be seen today.

## 2020-05-22 NOTE — Telephone Encounter (Signed)
Patient called to follow up from office visit on 05/09/20,reports she finished the Provera 10 mg tablet and started the birth control pills. Patient reports she has been taking birth control pills for a couple of days now and c/o heavy bleeding, wearing pad/tampon changing every 1 hours sometimes, last than every hour. Patient said she feel dizzy at times. She states "I can't work like this" was told to schedule an ultrasound, but states her insurance denied coverage of ultrasound so she can't schedule now. She asked what can she do about the heavy bleeding, reports Dr.Fontaine prescribed megace in the past and would like to know if this can be prescribed again to slow the bleeding down? Please advise

## 2020-05-23 ENCOUNTER — Other Ambulatory Visit: Payer: Self-pay

## 2020-05-23 ENCOUNTER — Emergency Department (HOSPITAL_BASED_OUTPATIENT_CLINIC_OR_DEPARTMENT_OTHER): Payer: 59

## 2020-05-23 ENCOUNTER — Encounter (HOSPITAL_BASED_OUTPATIENT_CLINIC_OR_DEPARTMENT_OTHER): Payer: Self-pay | Admitting: *Deleted

## 2020-05-23 ENCOUNTER — Emergency Department (HOSPITAL_BASED_OUTPATIENT_CLINIC_OR_DEPARTMENT_OTHER)
Admission: EM | Admit: 2020-05-23 | Discharge: 2020-05-23 | Disposition: A | Discharge status: Home / Self Care | Payer: 59 | Attending: Emergency Medicine | Admitting: Emergency Medicine

## 2020-05-23 DIAGNOSIS — Z91040 Latex allergy status: Secondary | ICD-10-CM | POA: Insufficient documentation

## 2020-05-23 DIAGNOSIS — N898 Other specified noninflammatory disorders of vagina: Secondary | ICD-10-CM | POA: Diagnosis not present

## 2020-05-23 DIAGNOSIS — N938 Other specified abnormal uterine and vaginal bleeding: Secondary | ICD-10-CM | POA: Insufficient documentation

## 2020-05-23 DIAGNOSIS — N939 Abnormal uterine and vaginal bleeding, unspecified: Secondary | ICD-10-CM | POA: Diagnosis not present

## 2020-05-23 LAB — CBC WITH DIFFERENTIAL/PLATELET
Abs Immature Granulocytes: 0.01 10*3/uL (ref 0.00–0.07)
Basophils Absolute: 0.1 10*3/uL (ref 0.0–0.1)
Basophils Relative: 1 %
Eosinophils Absolute: 0 10*3/uL (ref 0.0–0.5)
Eosinophils Relative: 1 %
HCT: 39.1 % (ref 36.0–46.0)
Hemoglobin: 13 g/dL (ref 12.0–15.0)
Immature Granulocytes: 0 %
Lymphocytes Relative: 43 %
Lymphs Abs: 2.2 10*3/uL (ref 0.7–4.0)
MCH: 30.4 pg (ref 26.0–34.0)
MCHC: 33.2 g/dL (ref 30.0–36.0)
MCV: 91.4 fL (ref 80.0–100.0)
Monocytes Absolute: 0.3 10*3/uL (ref 0.1–1.0)
Monocytes Relative: 6 %
Neutro Abs: 2.5 10*3/uL (ref 1.7–7.7)
Neutrophils Relative %: 49 %
Platelets: 278 10*3/uL (ref 150–400)
RBC: 4.28 MIL/uL (ref 3.87–5.11)
RDW: 12.8 % (ref 11.5–15.5)
WBC: 5.1 10*3/uL (ref 4.0–10.5)
nRBC: 0 % (ref 0.0–0.2)

## 2020-05-23 LAB — URINALYSIS, ROUTINE W REFLEX MICROSCOPIC
Bilirubin Urine: NEGATIVE
Glucose, UA: NEGATIVE mg/dL
Hgb urine dipstick: NEGATIVE
Ketones, ur: NEGATIVE mg/dL
Leukocytes,Ua: NEGATIVE
Nitrite: NEGATIVE
Protein, ur: NEGATIVE mg/dL
Specific Gravity, Urine: 1.025 (ref 1.005–1.030)
pH: 7 (ref 5.0–8.0)

## 2020-05-23 LAB — PREGNANCY, URINE: Preg Test, Ur: NEGATIVE

## 2020-05-23 MED ORDER — IBUPROFEN 400 MG PO TABS
600.0000 mg | ORAL_TABLET | Freq: Once | ORAL | Status: AC
  Administered 2020-05-23: 600 mg via ORAL
  Filled 2020-05-23: qty 1

## 2020-05-23 NOTE — ED Notes (Signed)
Pt unable to provide urine sample at this time 

## 2020-05-23 NOTE — Discharge Instructions (Signed)

## 2020-05-23 NOTE — ED Notes (Signed)
Pt transported to US

## 2020-05-23 NOTE — ED Triage Notes (Addendum)
C/o vaginal bleeding x 1 month, elective abortion 01/2020

## 2020-05-23 NOTE — ED Provider Notes (Signed)
[facility] EMERGENCY DEPARTMENT Provider Note   CSN: [id number] Arrival date & time: 05/23/20  4098     History Chief Complaint  Patient presents with  . Vaginal Bleeding    Alisha Edwards is a 30 y.o. female.  HPI   Pt is a 30 y/o female with a h/o anemia, anxiety, depression, dysmenorrhea, genital warts, HSV, migraine without aura, MRSA, STD, who presents to the ED today for eval of vaginal bleeding that has been ongoing for 1 month. States that her bleeding has been heavy and she has been going through tampons about once per hour however the bleeding has slowed today. Reports intermittent pelvic pain that is mild. Describes discomfort as cramping.   States she had an elective abortion 01/2020. She resumed her normal cycle after this.  Reports associated intermittent weakness and vaginal discharge (chronic, unchanged.) Denies fevers, vomiting, diarrhea, or urinary sxs.  She has a h/o irregular menstrual cycles but typically they last the same amount of time.   Pt has an ob-gyn and was seen on 3/30 for these sxs. She had a pelvic, labs, STD testing at that time and she also had an outpatient ultrasound that was ordered but has not been completed.  She tried to follow-up with her OB/GYN however was not able to make the appointment so was sent to the ED for further evaluation.  Past Medical History:  Diagnosis Date  . Anemia   . Anxiety   . Depression   . Dysmenorrhea   . Genital warts   . HSV (herpes simplex virus) anogenital infection   . Migraine without aura   . MRSA (methicillin resistant staph aureus) culture positive 2009   thigh  . STD (sexually transmitted disease)    Chlamydia    Patient Active Problem List   Diagnosis Date Noted  . Intrauterine pregnancy 12/18/2019  . Yeast vaginitis 04/12/2015  . Mood disorder (HCC) 08/19/2013  . HSV (herpes simplex virus) infection     Past Surgical History:  Procedure Laterality Date  . MOUTH SURGERY     . nasal surgery       OB History    Gravida  3   Para  1   Term  1   Preterm      AB  2   Living  1     SAB  1   IAB  1   Ectopic      Multiple      Live Births  1           Family History  Problem Relation Age of Onset  . Hypertension Maternal Grandmother   . Breast cancer Maternal Grandmother        Age 48's  . Breast cancer Paternal Grandmother        Age 64'S    Social History   Tobacco Use  . Smoking status: Never Smoker  . Smokeless tobacco: Never Used  Vaping Use  . Vaping Use: Never used  Substance Use Topics  . Alcohol use: Yes    Alcohol/week: 0.0 standard drinks    Comment: rarely  . Drug use: No    Home Medications Prior to Admission medications   Medication Sig Start Date End Date Taking? Authorizing Provider  ALPRAZolam Prudy Feeler) 0.5 MG tablet Take 0.5 mg by mouth at bedtime as needed for anxiety.    [provider]  medroxyPROGESTERone (PROVERA) 10 MG tablet After you finish the provera and start bleeding, then start the birth  control pills. 05/09/20   Romualdo Bolk, MD  naproxen sodium (ANAPROX DS) 550 MG tablet Take 1 tablet (550 mg total) by mouth 2 (two) times daily with a meal. Take prn cramps 05/09/20   Romualdo Bolk, MD  norethindrone-ethinyl estradiol (LOESTRIN FE) 1-20 MG-MCG tablet Take 1 tablet by mouth daily. 05/09/20   Romualdo Bolk, MD  valACYclovir Ralph Dowdy) 500 MG tablet take 1 tablet by mouth once daily 06/12/16   Harrington Challenger, NP    Allergies    Shellfish allergy and Latex  Review of Systems   Review of Systems  Constitutional: Negative for chills and fever.  HENT: Negative for ear pain and sore throat.   Eyes: Negative for pain and visual disturbance.  Respiratory: Negative for cough and shortness of breath.   Cardiovascular: Negative for chest pain.  Gastrointestinal: Negative for abdominal pain, constipation, diarrhea, nausea and vomiting.  Genitourinary: Positive for vaginal  bleeding and vaginal discharge (chronic, unchanged). Negative for dysuria and hematuria.  Musculoskeletal: Negative for back pain.  Skin: Negative for rash.  Neurological: Negative for seizures and syncope.  All other systems reviewed and are negative.   Physical Exam Updated Vital Signs BP (!) 112/44   Pulse 73   Temp 98.6 F (37 C) (Oral)   Resp 16   Ht 5\' 4"  (1.626 m)   Wt 59 kg   SpO2 100%   BMI 22.31 kg/m   Physical Exam Vitals and nursing note reviewed.  Constitutional:      General: She is not in acute distress.    Appearance: She is well-developed.  HENT:     Head: Normocephalic and atraumatic.  Eyes:     Conjunctiva/sclera: Conjunctivae normal.  Cardiovascular:     Rate and Rhythm: Normal rate and regular rhythm.     Heart sounds: Normal heart sounds. No murmur heard.   Pulmonary:     Effort: Pulmonary effort is normal. No respiratory distress.     Breath sounds: Normal breath sounds. No wheezing, rhonchi or rales.  Abdominal:     General: Bowel sounds are normal.     Palpations: Abdomen is soft.     Tenderness: There is no abdominal tenderness. There is no guarding or rebound.  Genitourinary:    Comments: Pelvic deferred  Musculoskeletal:     Cervical back: Neck supple.  Skin:    General: Skin is warm and dry.  Neurological:     Mental Status: She is alert.     ED Results / Procedures / Treatments   Labs (all labs ordered are listed, but only abnormal results are displayed) Labs Reviewed  CBC WITH DIFFERENTIAL/PLATELET  PREGNANCY, URINE  URINALYSIS, ROUTINE W REFLEX MICROSCOPIC    EKG None  Radiology PELVIC COMPLETE W TRANSVAGINAL AND TORSION R/O  Result Date: 05/23/2020 CLINICAL DATA:  Vaginal bleeding. EXAM: TRANSABDOMINAL AND TRANSVAGINAL ULTRASOUND OF PELVIS DOPPLER ULTRASOUND OF OVARIES TECHNIQUE: Both transabdominal and transvaginal ultrasound examinations of the pelvis were performed. Transabdominal technique was performed for  global imaging of the pelvis including uterus, ovaries, adnexal regions, and pelvic cul-de-sac. It was necessary to proceed with endovaginal exam following the transabdominal exam to visualize the endometrium and ovaries. Color and duplex Doppler ultrasound was utilized to evaluate blood flow to the ovaries. COMPARISON:  December 18, 2019. FINDINGS: Uterus Measurements: 8.1 x 5.7 x 3.9 cm = volume: 94 mL. No fibroids or other mass visualized. Endometrium Thickness: 7 mm which is within normal limits. No focal abnormality visualized. Right ovary  Measurements: 3.9 x 3.2 x 1.6 cm = volume: 11 mL. Normal appearance/no adnexal mass. Left ovary Measurements: 3.9 x 2.8 x 1.6 cm = volume: 9 mL. Normal appearance/no adnexal mass. Pulsed Doppler evaluation of both ovaries demonstrates normal low-resistance arterial and venous waveforms. Other findings No abnormal free fluid. IMPRESSION: No abnormality seen in the pelvis. No evidence of ovarian mass or torsion. Electronically Signed   By: Lupita Raider M.D.   On: 05/23/2020 18:12    Procedures Procedures   Medications Ordered in ED Medications  ibuprofen (ADVIL) tablet 600 mg (600 mg Oral Given 05/23/20 1819)    ED Course  I have reviewed the triage vital signs and the nursing notes.  Pertinent labs & imaging results that were available during my care of the patient were reviewed by me and considered in my medical decision making (see chart for details).    MDM Rules/Calculators/A&P                          30 year old female presenting emergency department today for evaluation of vaginal bleeding.  Has been following with OB/GYN for this and it has been ongoing for about a month.  She had a D&C about 4 months ago.  She has an outpatient ultrasound scheduled however could not go to the appointment.  She has been feeling lightheaded so they sent her here for further evaluation.  Pelvic exam deferred since patient just had this and already had STD testing at  that time.  We will check labs, pelvic ultrasound and a urine pregnancy test.  CBC unremarkable UA neg Urine preg neg Pelvic US No abnormality seen in the pelvis. No evidence of ovarian mass or Torsion.  6:43 PM CONSULT with DR. Lavoie with gynecology.  She recommends that the patient continue on OCPs however if the patient is insistent then she feels it is also appropriate to start Megace in the ED.  Reassessed the patient, discussed work-up thus far being reassuring.  She is agreeable to continue on OCPs and follow-up with her OB/GYN at this time.  I did advise that if she has any new or worsening symptoms she needs to return to the emergency department immediately.  She voices understanding of the plan and reasons to return but all questions answered.  Patient stable for discharge.  Final Clinical Impression(s) / ED Diagnoses Final diagnoses:  Dysfunctional uterine bleeding    Rx / DC Orders ED Discharge Orders    None       Rayne Du 05/23/20 1930    Charlynne Pander, MD 05/23/20 2108

## 2020-06-10 ENCOUNTER — Other Ambulatory Visit: Payer: Self-pay

## 2020-06-10 ENCOUNTER — Encounter (HOSPITAL_BASED_OUTPATIENT_CLINIC_OR_DEPARTMENT_OTHER): Payer: Self-pay

## 2020-06-10 ENCOUNTER — Emergency Department (HOSPITAL_BASED_OUTPATIENT_CLINIC_OR_DEPARTMENT_OTHER)
Admission: EM | Admit: 2020-06-10 | Discharge: 2020-06-10 | Disposition: A | Discharge status: Home / Self Care | Payer: 59 | Attending: Emergency Medicine | Admitting: Emergency Medicine

## 2020-06-10 DIAGNOSIS — N898 Other specified noninflammatory disorders of vagina: Secondary | ICD-10-CM | POA: Diagnosis present

## 2020-06-10 DIAGNOSIS — Z91040 Latex allergy status: Secondary | ICD-10-CM | POA: Insufficient documentation

## 2020-06-10 DIAGNOSIS — Z419 Encounter for procedure for purposes other than remedying health state, unspecified: Secondary | ICD-10-CM | POA: Diagnosis not present

## 2020-06-10 DIAGNOSIS — N762 Acute vulvitis: Secondary | ICD-10-CM | POA: Diagnosis not present

## 2020-06-10 LAB — URINALYSIS, ROUTINE W REFLEX MICROSCOPIC
Bilirubin Urine: NEGATIVE
Glucose, UA: NEGATIVE mg/dL
Hgb urine dipstick: NEGATIVE
Ketones, ur: NEGATIVE mg/dL
Leukocytes,Ua: NEGATIVE
Nitrite: NEGATIVE
Protein, ur: NEGATIVE mg/dL
Specific Gravity, Urine: >1.03 — ABNORMAL HIGH (ref 1.005–1.030)
pH: 6 (ref 5.0–8.0)

## 2020-06-10 LAB — WET PREP, GENITAL
Clue Cells Wet Prep HPF POC: NONE SEEN
Sperm: NONE SEEN
Trich, Wet Prep: NONE SEEN
Yeast Wet Prep HPF POC: NONE SEEN

## 2020-06-10 MED ORDER — DOXYCYCLINE HYCLATE 100 MG PO CAPS: 100.0000 mg | ORAL_CAPSULE | Freq: Two times a day (BID) | ORAL | 0 refills | Status: AC

## 2020-06-10 MED ORDER — LIDOCAINE HCL (PF) 1 % IJ SOLN
INTRAMUSCULAR | Status: AC
  Filled 2020-06-10: qty 10

## 2020-06-10 NOTE — Discharge Instructions (Addendum)
The boils on your labia are not amenable to incision and drainage at this time.  Instead, continue with sits baths and application of warm compresses 4-5 times daily to help facilitate continued drainage.  I have also prescribed doxycycline which I would like for her to take, as prescribed.  It will cover MRSA.  Please follow-up with your OB/GYN for ongoing evaluation and management.   Additionally, you have been tested today for HIV, syphilis, gonorrhea, and chlamydia. These results will be available in approximately 3 days. You may check your MyChart account for results. Please inform all sexual partners of positive results and that they should be tested and treated as well.  If you are positive, you will need to go to [facility] for free STI treatment.  Or your OB/GYN would be able to treat you.  Please wait 2 weeks and be sure that you and your partners are symptom free before returning to sexual activity. Please use protection with every sexual encounter.  Follow Up: Please followup with your primary doctor in 3 days for discussion of your diagnoses and further evaluation after today's visit; if you do not have a primary care doctor use the resource guide provided to find one; Please return to the ER for worsening symptoms, high fevers or persistent vomiting.

## 2020-06-10 NOTE — ED Provider Notes (Signed)
[facility] EMERGENCY DEPARTMENT Provider Note   CSN: [id number] Arrival date & time: 06/10/20  1737     History Chief Complaint  Patient presents with  . Vaginal Discharge    Alisha Edwards is a 30 y.o. female with PMH of genital warts, HSV-2, MRSA, and PID who was followed by Dr. Oscar La OB/GYN who presents to the ED with 5-day history of vaginal boils and vaginal discharge.  Patient was last evaluated by her OB/GYN on 05/09/2020.  STI testing obtained at that time was negative for chlamydia, gonorrhea, and trichomoniasis.  She was then seen in the ER on 05/23/2020 for dysfunctional uterine bleeding and was encouraged to follow-up with her OB/GYN.  On my examination, patient reports that she has been having unprotected sexual intercourse with a new female partner for the past 3 weeks.  Last week she shaved her vagina and then a few days ago she noticed that she had multiple boils.  She states that they have since ruptured and she has been treating them with Epsom salt baths.  She states that they have improved, but she is concerned given her history of MRSA.  She states that she has since developed an itchy white thick discharge comparable to her prior history of yeast infections.  She would like to be tested for GC, HIV, and RPR given new sexual partner.  She is reassured that her testing last month was normal.  She denies any dyspareunia.  She also denies any fevers, chills, abdominal pain, pain with defecation, or any other symptoms.  Patient denies pregnancy.    HPI     Past Medical History:  Diagnosis Date  . Anemia   . Anxiety   . Depression   . Dysmenorrhea   . Genital warts   . HSV (herpes simplex virus) anogenital infection   . Migraine without aura   . MRSA (methicillin resistant staph aureus) culture positive 2009   thigh  . STD (sexually transmitted disease)    Chlamydia    Patient Active Problem List   Diagnosis Date Noted  . Intrauterine pregnancy  12/18/2019  . Yeast vaginitis 04/12/2015  . Mood disorder (HCC) 08/19/2013  . HSV (herpes simplex virus) infection     Past Surgical History:  Procedure Laterality Date  . MOUTH SURGERY    . nasal surgery       OB History    Gravida  3   Para  1   Term  1   Preterm      AB  2   Living  1     SAB  1   IAB  1   Ectopic      Multiple      Live Births  1           Family History  Problem Relation Age of Onset  . Hypertension Maternal Grandmother   . Breast cancer Maternal Grandmother        Age 57's  . Breast cancer Paternal Grandmother        Age 73'S    Social History   Tobacco Use  . Smoking status: Never Smoker  . Smokeless tobacco: Never Used  Vaping Use  . Vaping Use: Never used  Substance Use Topics  . Alcohol use: Yes    Alcohol/week: 0.0 standard drinks    Comment: rarely  . Drug use: No    Home Medications Prior to Admission medications   Medication Sig Start Date End Date Taking? Authorizing  Provider  doxycycline (VIBRAMYCIN) 100 MG capsule Take 1 capsule (100 mg total) by mouth 2 (two) times daily for 7 days. 06/10/20 06/17/20 Yes Alisha New, PA-C  ALPRAZolam Prudy Feeler) 0.5 MG tablet Take 0.5 mg by mouth at bedtime as needed for anxiety.    [provider]  medroxyPROGESTERone (PROVERA) 10 MG tablet After you finish the provera and start bleeding, then start the birth control pills. 05/09/20   Romualdo Bolk, MD  naproxen sodium (ANAPROX DS) 550 MG tablet Take 1 tablet (550 mg total) by mouth 2 (two) times daily with a meal. Take prn cramps 05/09/20   Romualdo Bolk, MD  norethindrone-ethinyl estradiol (LOESTRIN FE) 1-20 MG-MCG tablet Take 1 tablet by mouth daily. 05/09/20   Romualdo Bolk, MD  valACYclovir Ralph Dowdy) 500 MG tablet take 1 tablet by mouth once daily 06/12/16   Harrington Challenger, NP    Allergies    Shellfish allergy and Latex  Review of Systems   Review of Systems  All other systems reviewed and  are negative.   Physical Exam Updated Vital Signs BP 104/73 (BP Location: Right Arm)   Pulse 85   Temp 98.4 F (36.9 C) (Oral)   Resp 20   Ht 5\' 4"  (1.626 m)   Wt 59 kg   LMP 04/24/2020   SpO2 99%   BMI 22.31 kg/m   Physical Exam Vitals and nursing note reviewed. Exam conducted with a chaperone present.  Constitutional:      General: She is not in acute distress.    Appearance: Normal appearance. She is not ill-appearing.  HENT:     Head: Normocephalic and atraumatic.  Eyes:     General: No scleral icterus.    Conjunctiva/sclera: Conjunctivae normal.  Cardiovascular:     Rate and Rhythm: Normal rate.     Pulses: Normal pulses.  Pulmonary:     Effort: Pulmonary effort is normal. No respiratory distress.  Abdominal:     General: Abdomen is flat. There is no distension.     Palpations: Abdomen is soft.     Tenderness: There is no abdominal tenderness.  Genitourinary:    Vagina: Vaginal discharge present.     Comments: External exam: Mild swelling and mild cellulitic changes to left outer labia.  No focal collections of fluid otherwise concerning for abscess that would be amenable to drainage.  No visualized HPV warts or outbreaks of herpes. Speculum exam: Moderate amount of thin white vaginal discharge in vaginal vault.  Cervix is visualized, nonerythematous.  Nonfriable. Bimanual exam: No CMT or adnexal tenderness.  No masses. Musculoskeletal:     Cervical back: Normal range of motion.  Skin:    General: Skin is dry.  Neurological:     Mental Status: She is alert and oriented to person, place, and time.     GCS: GCS eye subscore is 4. GCS verbal subscore is 5. GCS motor subscore is 6.  Psychiatric:        Mood and Affect: Mood normal.        Behavior: Behavior normal.        Thought Content: Thought content normal.     ED Results / Procedures / Treatments   Labs (all labs ordered are listed, but only abnormal results are displayed) Labs Reviewed  WET PREP,  GENITAL - Abnormal; Notable for the following components:      Result Value   WBC, Wet Prep HPF POC FEW (*)    All other components within  normal limits  URINALYSIS, ROUTINE W REFLEX MICROSCOPIC - Abnormal; Notable for the following components:   Specific Gravity, Urine >1.030 (*)    All other components within normal limits  RPR  HIV ANTIBODY (ROUTINE TESTING W REFLEX)  GC/CHLAMYDIA PROBE AMP ([place]) NOT AT [facility]    EKG None  Radiology No results found.  Procedures Procedures   Medications Ordered in ED Medications - No data to display  ED Course  I have reviewed the triage vital signs and the nursing notes.  Pertinent labs & imaging results that were available during my care of the patient were reviewed by me and considered in my medical decision making (see chart for details).    MDM Rules/Calculators/A&P                          NYASIA BAXLEY was evaluated in Emergency Department on 06/10/2020 for the symptoms described in the history of present illness. She was evaluated in the context of the global COVID-19 pandemic, which necessitated consideration that the patient might be at risk for infection with the SARS-CoV-2 virus that causes COVID-19. Institutional protocols and algorithms that pertain to the evaluation of patients at risk for COVID-19 are in a state of rapid change based on information released by regulatory bodies including the CDC and federal and state organizations. These policies and algorithms were followed during the patient's care in the ED.  I personally reviewed patient's medical chart and all notes from triage and staff during today's encounter. I have also ordered and reviewed all labs and imaging that I felt to be medically necessary in the evaluation of this patient's complaints and with consideration of their physical exam. If needed, translation services were available and utilized.   Patient with a 3-day history of left labial discomfort and  concern for actively draining boils.  My physical exam was reassuring.  There are 2-3 less than 1 cm bumps on left labia, no fluctuance or obvious fluid collection otherwise concerning for abscess amenable to drainage.  They are mildly tender.  No significant induration, but mildly erythematous.  We will treat as a cellulitis.  Given history of MRSA, will prescribe doxycycline x7 days.  She can follow-up with her OB/GYN for ongoing evaluation management.  Wet prep obtained along with GC, HIV, and RPR testing.  Will not treat empirically for gonorrhea.  She is effectively being treated empirically for chlamydia, however given recent negative STD testing 1 month ago, will abstain from empiric treatment for gonorrhea.  Her only risk factors when the sexual partner and her primary complaint was labial "boils".  The remainder of her physical exam was entirely benign.  No abdominal tenderness or concerning history.  Recommending continued sitz bath's and application of warm compresses to the affected areas.  Wet prep with few WBC, but negative for yeast, trichomoniasis, or clue cells otherwise concerning for BV.  UA without evidence of infection.  Patient is confident that she is not pregnant and does not want confirmation with testing.   Return precautions discussed.  Patient voices understanding and is agreeable to the plan.  Final Clinical Impression(s) / ED Diagnoses Final diagnoses:  Cellulitis of labia majora    Rx / DC Orders ED Discharge Orders         Ordered    doxycycline (VIBRAMYCIN) 100 MG capsule  2 times daily        06/10/20 1957  Alisha Edwards, Alisha Boody L, PA-C 06/10/20 2006    Sabino DonovanKatz, Eric C, MD 06/11/20 [phone]

## 2020-06-10 NOTE — ED Triage Notes (Signed)
Pt states she has "3 boils" on her vagina since wednesday, states has been shaving more frequently. Had staph infection in high school. Also c/o white vaginal discharge with itching.

## 2020-06-11 LAB — HIV ANTIBODY (ROUTINE TESTING W REFLEX): HIV Screen 4th Generation wRfx: NONREACTIVE

## 2020-06-11 LAB — GC/CHLAMYDIA PROBE AMP (~~LOC~~) NOT AT ARMC
Chlamydia: NEGATIVE
Comment: NEGATIVE
Comment: NORMAL
Neisseria Gonorrhea: NEGATIVE

## 2020-06-11 LAB — RPR: RPR Ser Ql: NONREACTIVE

## 2020-06-13 ENCOUNTER — Telehealth: Payer: Self-pay

## 2020-06-13 DIAGNOSIS — B009 Herpesviral infection, unspecified: Secondary | ICD-10-CM | POA: Insufficient documentation

## 2020-06-13 MED ORDER — FLUCONAZOLE 150 MG PO TABS: 150.0000 mg | ORAL_TABLET | Freq: Once | ORAL | 0 refills | Status: AC

## 2020-06-13 MED ORDER — VALACYCLOVIR HCL 500 MG PO TABS: 500.0000 mg | ORAL_TABLET | Freq: Every day | ORAL | 0 refills | Status: DC

## 2020-06-13 NOTE — Telephone Encounter (Signed)
Spoke with patient and advised her. ?

## 2020-06-13 NOTE — Telephone Encounter (Signed)
Patient called to request the following prescriptions:  #1  She said she needs a refill on Valtrex.   #2 She also requests a prescription for Diflucan.  She was seen in the ED for boils on her labia and was prescribed Doxycycline. She said she asked for them to cover her with Rx there but because wet prep was negative for yeast they would not prescribe anything for yeast. Now she is experiencing vaginal irritation and itching.  The last AEX on file for our office is 05/20/2016 with Dr. Audie Box. AEX is scheduled for 09/06/2020 with Dr. Oscar La.  She did have pelvic exam 05/09/20 with Dr. Oscar La.

## 2020-06-13 NOTE — Telephone Encounter (Signed)
I sent the refill for the valtrex and one diflucan to the pharmacy. If her symptoms don't improve with the diflucan, she should be seen.

## 2020-07-05 ENCOUNTER — Other Ambulatory Visit: Payer: Self-pay

## 2020-07-05 ENCOUNTER — Encounter: Payer: Self-pay | Admitting: Nurse Practitioner

## 2020-07-05 ENCOUNTER — Ambulatory Visit (INDEPENDENT_AMBULATORY_CARE_PROVIDER_SITE_OTHER): Payer: 59 | Admitting: Nurse Practitioner

## 2020-07-05 VITALS — BP 118/70 | HR 68 | Resp 14 | Ht 65.0 in | Wt 130.0 lb

## 2020-07-05 DIAGNOSIS — N898 Other specified noninflammatory disorders of vagina: Secondary | ICD-10-CM | POA: Diagnosis not present

## 2020-07-05 LAB — WET PREP FOR TRICH, YEAST, CLUE

## 2020-07-05 MED ORDER — NYSTATIN-TRIAMCINOLONE 100000-0.1 UNIT/GM-% EX OINT: 1.0000 "application " | TOPICAL_OINTMENT | Freq: Two times a day (BID) | CUTANEOUS | 2 refills | Status: DC

## 2020-07-05 NOTE — Patient Instructions (Addendum)
This is a great book you may want to check out:  Vagina Bible by Dr. Theressa Stamps

## 2020-07-05 NOTE — Progress Notes (Signed)
GYNECOLOGY  VISIT  CC:   Vaginitis  HPI: 30 y.o. [id number] Single Black or African American female here for yeast symptoms. Vaginal irritation.    Has been having problems with yeast x 10 years. Feels like she get 2 yeast infections per month, maybe they are not going away. Has always used Diflucan and never used anything else. Can not keep coming in Using Boric acid intermittently about 1-3 per month for suppression but now it is starting to burn.  Has the same sexual partner and is not concern for STD.  GYNECOLOGIC HISTORY: Patient's last menstrual period was 06/26/2020. Contraception: OCP Menopausal hormone therapy: none  Patient Active Problem List   Diagnosis Date Noted  . Intrauterine pregnancy 12/18/2019  . Yeast vaginitis 04/12/2015  . Mood disorder (HCC) 08/19/2013  . HSV (herpes simplex virus) infection     Past Medical History:  Diagnosis Date  . Anemia   . Anxiety   . Depression   . Dysmenorrhea   . Genital warts   . HSV (herpes simplex virus) anogenital infection   . Migraine without aura   . MRSA (methicillin resistant staph aureus) culture positive 2009   thigh  . STD (sexually transmitted disease)    Chlamydia    Past Surgical History:  Procedure Laterality Date  . MOUTH SURGERY    . nasal surgery      MEDS:   Current Outpatient Medications on File Prior to Visit  Medication Sig Dispense Refill  . ALPRAZolam (XANAX) 0.5 MG tablet Take 0.5 mg by mouth at bedtime as needed for anxiety.    . valACYclovir (VALTREX) 500 MG tablet Take 1 tablet (500 mg total) by mouth daily. 90 tablet 0   No current facility-administered medications on file prior to visit.    ALLERGIES: Shellfish allergy and Latex  Family History  Problem Relation Age of Onset  . Hypertension Maternal Grandmother   . Breast cancer Maternal Grandmother        Age 50's  . Breast cancer Paternal Grandmother        Age 27'S     Review of Systems  Genitourinary: Positive for  vaginal discharge.       Vaginal irritation    All other systems reviewed and are negative.   PHYSICAL EXAMINATION:    BP 118/70 (BP Location: Right Arm, Patient Position: Sitting, Cuff Size: Normal)   Pulse 68   Resp 14   Ht 5\' 5"  (1.651 m)   Wt 130 lb (59 kg)   LMP 06/26/2020   BMI 21.63 kg/m     General appearance: alert, cooperative, no acute distress  Lymph:  no inguinal LAD noted  Pelvic: External genitalia:  no lesions, external redness, no swelling              Urethra:  normal appearing urethra with no masses, tenderness or lesions              Bartholins and Skenes: normal                 Vagina: normal appearing vagina, minimal discharge, redness noted              Cervix: no cervical motion tenderness and no lesions              Bimanual Exam:  Uterus:  normal size, contour, position, consistency, mobility, non-tender              Adnexa: no mass, fullness, tenderness  Chaperone, Emily, CMA, was present for exam.  Assessment: Vaginal irritation - Plan: Candidiasis, PCR, WET PREP FOR TRICH, YEAST, CLUE, nystatin-triamcinolone ointment (MYCOLOG)  Plan: D/C boric acid  Will wait on yeast culture May be a good candidate for Brexafemme if positive, for now Mycolog should offer symptom relief

## 2020-07-06 ENCOUNTER — Ambulatory Visit: Payer: 59 | Admitting: Nurse Practitioner

## 2020-07-09 LAB — CANDIDIASIS, PCR
C. albicans, DNA: NOT DETECTED
C. glabrata, DNA: NOT DETECTED
C. parapsilosis, DNA: NOT DETECTED
C. tropicalis, DNA: NOT DETECTED

## 2020-07-11 DIAGNOSIS — Z419 Encounter for procedure for purposes other than remedying health state, unspecified: Secondary | ICD-10-CM | POA: Diagnosis not present

## 2020-07-25 ENCOUNTER — Encounter: Payer: Self-pay | Admitting: Nurse Practitioner

## 2020-07-29 ENCOUNTER — Other Ambulatory Visit: Payer: Self-pay | Admitting: Obstetrics and Gynecology

## 2020-07-29 DIAGNOSIS — Z3009 Encounter for other general counseling and advice on contraception: Secondary | ICD-10-CM

## 2020-08-10 DIAGNOSIS — Z419 Encounter for procedure for purposes other than remedying health state, unspecified: Secondary | ICD-10-CM | POA: Diagnosis not present

## 2020-08-28 ENCOUNTER — Encounter (HOSPITAL_BASED_OUTPATIENT_CLINIC_OR_DEPARTMENT_OTHER): Payer: Self-pay | Admitting: *Deleted

## 2020-08-28 ENCOUNTER — Emergency Department (HOSPITAL_BASED_OUTPATIENT_CLINIC_OR_DEPARTMENT_OTHER)
Admission: EM | Admit: 2020-08-28 | Discharge: 2020-08-29 | Disposition: A | Discharge status: Home / Self Care | Payer: 59 | Attending: Emergency Medicine | Admitting: Emergency Medicine

## 2020-08-28 ENCOUNTER — Other Ambulatory Visit: Payer: Self-pay

## 2020-08-28 DIAGNOSIS — Z91040 Latex allergy status: Secondary | ICD-10-CM | POA: Insufficient documentation

## 2020-08-28 DIAGNOSIS — U071 COVID-19: Secondary | ICD-10-CM | POA: Insufficient documentation

## 2020-08-28 DIAGNOSIS — R072 Precordial pain: Secondary | ICD-10-CM | POA: Diagnosis present

## 2020-08-28 DIAGNOSIS — R079 Chest pain, unspecified: Secondary | ICD-10-CM

## 2020-08-28 NOTE — ED Provider Notes (Signed)
[facility] EMERGENCY DEPARTMENT Provider Note  CSN: [id number] Arrival date & time: 08/28/20 2314  Chief Complaint(s) Shortness of Breath  HPI Alisha Edwards is a 30 y.o. female   The history is provided by the patient.  Chest Pain Pain location:  Substernal area Pain quality: tightness   Pain severity:  Mild Onset quality:  Gradual Duration:  2 days Timing:  Constant Progression:  Unchanged Chronicity:  New Context comment:  Diagnosed with COVID on 7/5 Relieved by:  Nothing Worsened by:  Nothing Associated symptoms: cough, fatigue and shortness of breath   Associated symptoms: no fever and no vomiting   Associated symptoms comment:  Nasal congestion Risk factors: no birth control, no immobilization, not pregnant, no prior DVT/PE and no smoking    Past Medical History Past Medical History:  Diagnosis Date   Anemia    Anxiety    Depression    Dysmenorrhea    Genital warts    HSV (herpes simplex virus) anogenital infection    Migraine without aura    MRSA (methicillin resistant staph aureus) culture positive 2009   thigh   STD (sexually transmitted disease)    Chlamydia   Patient Active Problem List   Diagnosis Date Noted   Intrauterine pregnancy 12/18/2019   Yeast vaginitis 04/12/2015   Mood disorder (HCC) 08/19/2013   HSV (herpes simplex virus) infection    Home Medication(s) Prior to Admission medications   Medication Sig Start Date End Date Taking? Authorizing Provider  ALPRAZolam Prudy Feeler) 0.5 MG tablet Take 0.5 mg by mouth at bedtime as needed for anxiety.    [provider]  nystatin-triamcinolone ointment (MYCOLOG) Apply 1 application topically 2 (two) times daily. 07/05/20   Clarita Crane, NP  valACYclovir (VALTREX) 500 MG tablet Take 1 tablet (500 mg total) by mouth daily. 06/13/20   Romualdo Bolk, MD                                                                                                                                     Past Surgical History Past Surgical History:  Procedure Laterality Date   MOUTH SURGERY     nasal surgery     Family History Family History  Problem Relation Age of Onset   Hypertension Maternal Grandmother    Breast cancer Maternal Grandmother        Age 100's   Breast cancer Paternal Grandmother        Age 30'S    Social History Social History   Tobacco Use   Smoking status: Never   Smokeless tobacco: Never  Vaping Use   Vaping Use: Never used  Substance Use Topics   Alcohol use: Yes    Alcohol/week: 0.0 standard drinks    Comment: rarely   Drug use: No   Allergies Shellfish allergy and Latex  Review of Systems Review of Systems  Constitutional:  Positive for fatigue. Negative for fever.  Respiratory:  Positive for cough and shortness of breath.   Cardiovascular:  Positive for chest pain.  Gastrointestinal:  Negative for vomiting.  All other systems are reviewed and are negative for acute change except as noted in the HPI  Physical Exam Vital Signs  I have reviewed the triage vital signs BP 124/88   Pulse 62   Temp 98.6 F (37 C) (Oral)   Resp 16   Ht 5\' 4"  (1.626 m)   Wt 59 kg   LMP 08/01/2020   SpO2 100%   BMI 22.31 kg/m   Physical Exam Vitals reviewed.  Constitutional:      General: She is not in acute distress.    Appearance: She is well-developed. She is not diaphoretic.  HENT:     Head: Normocephalic and atraumatic.     Nose: Congestion present.  Eyes:     General: No scleral icterus.       Right eye: No discharge.        Left eye: No discharge.     Conjunctiva/sclera: Conjunctivae normal.     Pupils: Pupils are equal, round, and reactive to light.  Cardiovascular:     Rate and Rhythm: Normal rate and regular rhythm.     Heart sounds: No murmur heard.   No friction rub. No gallop.  Pulmonary:     Effort: Pulmonary effort is normal. No respiratory distress.     Breath sounds: Normal breath sounds. No stridor. No rales.  Abdominal:      General: There is no distension.     Palpations: Abdomen is soft.     Tenderness: There is no abdominal tenderness.  Musculoskeletal:        General: No tenderness.     Cervical back: Normal range of motion and neck supple.  Skin:    General: Skin is warm and dry.     Findings: No erythema or rash.  Neurological:     Mental Status: She is alert and oriented to person, place, and time.    ED Results and Treatments Labs (all labs ordered are listed, but only abnormal results are displayed) Labs Reviewed  COMPREHENSIVE METABOLIC PANEL - Abnormal; Notable for the following components:      Result Value   Potassium 3.4 (*)    All other components within normal limits  D-DIMER, QUANTITATIVE - Abnormal; Notable for the following components:   D-Dimer, Quant 0.87 (*)    All other components within normal limits  CBC  HCG, QUANTITATIVE, PREGNANCY  TROPONIN I (HIGH SENSITIVITY)                                                                                                                         EKG  EKG Interpretation  Date/Time:  Wednesday August 29 2020 00:25:55 EDT Ventricular Rate:  78 PR Interval:  133 QRS Duration: 85 QT Interval:  404 QTC Calculation: 461 R Axis:   -11 Text Interpretation: Sinus rhythm No significant change since last  tracing Confirmed by Drema Pryardama, Lofton Leon [phone]([id number]) on 08/29/2020 12:45:49 AM       Radiology CT Angio Chest PE W and/or Wo Contrast  Result Date: 08/29/2020 CLINICAL DATA:  Shortness of breath on exertion x2 days, positive D-dimer, recent COVID diagnosis EXAM: CT ANGIOGRAPHY CHEST WITH CONTRAST TECHNIQUE: Multidetector CT imaging of the chest was performed using the standard protocol during bolus administration of intravenous contrast. Multiplanar CT image reconstructions and MIPs were obtained to evaluate the vascular anatomy. CONTRAST:  75mL OMNIPAQUE IOHEXOL 350 MG/ML SOLN COMPARISON:  Chest radiographs dated 08/29/2020 FINDINGS:  Cardiovascular: Satisfactory opacification of the bilateral pulmonary arteries to the segmental level. No evidence of pulmonary embolism. Although not tailored for evaluation of the thoracic aorta, there is no evidence of thoracic aortic aneurysm or dissection. The heart is normal in size.  No pericardial effusion. Mediastinum/Nodes: No suspicious mediastinal lymphadenopathy. Visualized thyroid is unremarkable. Lungs/Pleura: Lungs are clear. No suspicious pulmonary nodules. No focal consolidation. No pleural effusion or pneumothorax. Upper Abdomen: Visualized upper abdomen is grossly unremarkable. Musculoskeletal: Visualized osseous structures are within normal limits. Review of the MIP images confirms the above findings. IMPRESSION: No evidence of pulmonary embolism. Normal CT chest. Electronically Signed   By: Charline BillsSriyesh  Krishnan M.D.   On: 08/29/2020 01:59   DG Chest Port 1 View  Result Date: 08/29/2020 CLINICAL DATA:  Shortness of breath EXAM: PORTABLE CHEST 1 VIEW COMPARISON:  10/21/2015 FINDINGS: The heart size and mediastinal contours are within normal limits. Both lungs are clear. The visualized skeletal structures are unremarkable. IMPRESSION: No active disease. Electronically Signed   By: Deatra RobinsonKevin  Herman M.D.   On: 08/29/2020 00:29    Pertinent labs & imaging results that were available during my care of the patient were reviewed by me and considered in my medical decision making (see chart for details).  Medications Ordered in ED Medications  alum & mag hydroxide-simeth (MAALOX/MYLANTA) 200-200-20 MG/5ML suspension 30 mL (30 mLs Oral Given 08/29/20 0132)  iohexol (OMNIPAQUE) 350 MG/ML injection 75 mL (75 mLs Intravenous Contrast Given 08/29/20 0135)                                                                                                                                    Procedures Procedures  (including critical care time)  Medical Decision Making / ED Course I have reviewed the  nursing notes for this encounter and the patient's prior records (if available in EHR or on provided paperwork).   Thurnell Loseakerra D Mach was evaluated in Emergency Department on 08/29/2020 for the symptoms described in the history of present illness. She was evaluated in the context of the global COVID-19 pandemic, which necessitated consideration that the patient might be at risk for infection with the SARS-CoV-2 virus that causes COVID-19. Institutional protocols and algorithms that pertain to the evaluation of patients at risk for COVID-19 are in a state of rapid change based on information released by regulatory bodies  including the CDC and federal and state organizations. These policies and algorithms were followed during the patient's care in the ED.  Chest tightness. Reported COVID + EKG without acute ischemic changes or evidence of pericarditis. Troponin negative. Presentation is highly atypical for ACS.  Given the duration of patient's pain, feel that this is sufficient to rule out ACS.Marland Kitchen  Presentation not classic for aortic dissection or esophageal perforation.  Chest x-ray without evidence suggestive of pneumonia, pneumothorax, pneumomediastinum.  No abnormal contour of the mediastinum to suggest dissection. No evidence of acute injuries.  Low suspicion for PE but dimer positive.  CTA negative for PE, pneumonia.  Patient provided with GI cocktail for possible GI etiology. Tightness now resolved.      Final Clinical Impression(s) / ED Diagnoses Final diagnoses:  Chest pain    The patient appears reasonably screened and/or stabilized for discharge and I doubt any other medical condition or other [facility] requiring further screening, evaluation, or treatment in the ED at this time prior to discharge. Safe for discharge with strict return precautions.  Disposition: Discharge  Condition: Good  I have discussed the results, Dx and Tx plan with the patient/family who expressed understanding  and agree(s) with the plan. Discharge instructions discussed at length. The patient/family was given strict return precautions who verbalized understanding of the instructions. No further questions at time of discharge.    ED Discharge Orders     None       Follow Up: Primary care provider  Schedule an appointment as soon as possible for a visit      This chart was dictated using voice recognition software.  Despite best efforts to proofread,  errors can occur which can change the documentation meaning.    Nira Conn, MD 08/29/20 [phone]

## 2020-08-28 NOTE — ED Triage Notes (Signed)
C/o SOB  with exertion x 2 days , DX covid 7/5.

## 2020-08-28 NOTE — ED Notes (Signed)
amb with pulse ox, amb w/o difficulty or SOB o2 sat remained at 99 % and HR 78

## 2020-08-29 ENCOUNTER — Emergency Department (HOSPITAL_BASED_OUTPATIENT_CLINIC_OR_DEPARTMENT_OTHER): Payer: 59

## 2020-08-29 LAB — CBC
HCT: 41 % (ref 36.0–46.0)
Hemoglobin: 13.8 g/dL (ref 12.0–15.0)
MCH: 30.1 pg (ref 26.0–34.0)
MCHC: 33.7 g/dL (ref 30.0–36.0)
MCV: 89.3 fL (ref 80.0–100.0)
Platelets: 302 10*3/uL (ref 150–400)
RBC: 4.59 MIL/uL (ref 3.87–5.11)
RDW: 12.8 % (ref 11.5–15.5)
WBC: 6.2 10*3/uL (ref 4.0–10.5)
nRBC: 0 % (ref 0.0–0.2)

## 2020-08-29 LAB — HCG, QUANTITATIVE, PREGNANCY: hCG, Beta Chain, Quant, S: <1 m[IU]/mL (ref <5)

## 2020-08-29 LAB — COMPREHENSIVE METABOLIC PANEL
ALT: 16 U/L (ref 0–44)
AST: 15 U/L (ref 15–41)
Albumin: 3.9 g/dL (ref 3.5–5.0)
Alkaline Phosphatase: 45 U/L (ref 38–126)
Anion gap: 10 (ref 5–15)
BUN: 13 mg/dL (ref 6–20)
CO2: 26 mmol/L (ref 22–32)
Calcium: 8.9 mg/dL (ref 8.9–10.3)
Chloride: 99 mmol/L (ref 98–111)
Creatinine, Ser: 0.9 mg/dL (ref 0.44–1.00)
GFR, Estimated: >60 mL/min (ref >60)
Glucose, Bld: 87 mg/dL (ref 70–99)
Potassium: 3.4 mmol/L — ABNORMAL LOW (ref 3.5–5.1)
Sodium: 135 mmol/L (ref 135–145)
Total Bilirubin: 0.6 mg/dL (ref 0.3–1.2)
Total Protein: 7.6 g/dL (ref 6.5–8.1)

## 2020-08-29 LAB — TROPONIN I (HIGH SENSITIVITY): Troponin I (High Sensitivity): <2 ng/L (ref <18)

## 2020-08-29 LAB — D-DIMER, QUANTITATIVE: D-Dimer, Quant: 0.87 ug/mL-FEU — ABNORMAL HIGH (ref 0.00–0.50)

## 2020-08-29 MED ORDER — IOHEXOL 350 MG/ML SOLN
75.0000 mL | Freq: Once | INTRAVENOUS | Status: AC | PRN
  Administered 2020-08-29: 75 mL via INTRAVENOUS

## 2020-08-29 MED ORDER — ALUM & MAG HYDROXIDE-SIMETH 200-200-20 MG/5ML PO SUSP
30.0000 mL | Freq: Once | ORAL | Status: AC
  Administered 2020-08-29: 30 mL via ORAL
  Filled 2020-08-29: qty 30

## 2020-08-30 ENCOUNTER — Telehealth: Payer: Self-pay

## 2020-08-30 NOTE — Telephone Encounter (Signed)
Transition Care Management Unsuccessful Follow-up Telephone Call  Date of discharge and from where:  08/29/2020-[facility]  Attempts:  1st Attempt  Reason for unsuccessful TCM follow-up call:  Left voice message

## 2020-08-31 NOTE — Telephone Encounter (Signed)
Transition Care Management Unsuccessful Follow-up Telephone Call  Date of discharge and from where:  08/29/2020-[facility]  Attempts:  2nd Attempt  Reason for unsuccessful TCM follow-up call:  Left voice message

## 2020-09-03 NOTE — Telephone Encounter (Signed)
Transition Care Management Unsuccessful Follow-up Telephone Call  Date of discharge and from where:  08/29/2020 from [facility]  Attempts:  3rd Attempt  Reason for unsuccessful TCM follow-up call:  Unable to reach patient

## 2020-09-04 NOTE — Progress Notes (Deleted)
30 y.o. [id number] Single Black or African American Not Hispanic or Latino female here for annual exam.      No LMP recorded.          Sexually active: {yes no:314532}  The current method of family planning is {contraception:315051}.    Exercising: {yes no:314532}  {types:19826} Smoker:  {YES [id number]  Health Maintenance: Pap: 04/12/15 Normal 06/12/11 normal  History of abnormal Pap:  no MMG:  none  BMD:   none  Colonoscopy: none  TDaP:  09/18/15  Gardasil: 04/12/15    reports that she has never smoked. She has never used smokeless tobacco. She reports current alcohol use. She reports that she does not use drugs.  Past Medical History:  Diagnosis Date   Anemia    Anxiety    Depression    Dysmenorrhea    Genital warts    HSV (herpes simplex virus) anogenital infection    Migraine without aura    MRSA (methicillin resistant staph aureus) culture positive 2009   thigh   STD (sexually transmitted disease)    Chlamydia    Past Surgical History:  Procedure Laterality Date   MOUTH SURGERY     nasal surgery      Current Outpatient Medications  Medication Sig Dispense Refill   ALPRAZolam (XANAX) 0.5 MG tablet Take 0.5 mg by mouth at bedtime as needed for anxiety.     nystatin-triamcinolone ointment (MYCOLOG) Apply 1 application topically 2 (two) times daily. 30 g 2   valACYclovir (VALTREX) 500 MG tablet Take 1 tablet (500 mg total) by mouth daily. 90 tablet 0   No current facility-administered medications for this visit.    Family History  Problem Relation Age of Onset   Hypertension Maternal Grandmother    Breast cancer Maternal Grandmother        Age 67's   Breast cancer Paternal Grandmother        Age 7'S    Review of Systems  Exam:   There were no vitals taken for this visit.  Weight change: @WEIGHTCHANGE @ Height:      Ht Readings from Last 3 Encounters:  08/28/20 5\' 4"  (1.626 m)  07/05/20 5\' 5"  (1.651 m)  06/10/20 5\' 4"  (1.626 m)    General appearance: alert,  cooperative and appears stated age Head: Normocephalic, without obvious abnormality, atraumatic Neck: no adenopathy, supple, symmetrical, trachea midline and thyroid {CHL AMB PHY EX THYROID NORM DEFAULT:[phone]} Lungs: clear to auscultation bilaterally Cardiovascular: regular rate and rhythm Breasts: {Exam; breast:13139} Abdomen: soft, non-tender; non distended,  no masses,  no organomegaly Extremities: extremities normal, atraumatic, no cyanosis or edema Skin: Skin color, texture, turgor normal. No rashes or lesions Lymph nodes: Cervical, supraclavicular, and axillary nodes normal. No abnormal inguinal nodes palpated Neurologic: Grossly normal   Pelvic: External genitalia:  no lesions              Urethra:  normal appearing urethra with no masses, tenderness or lesions              Bartholins and Skenes: normal                 Vagina: normal appearing vagina with normal color and discharge, no lesions              Cervix: {CHL AMB PHY EX CERVIX NORM DEFAULT:[phone]}               Bimanual Exam:  Uterus:  {CHL AMB PHY EX UTERUS NORM DEFAULT:[phone]}  Adnexa: {CHL AMB PHY EX ADNEXA NO MASS [phone]}               Rectovaginal: Confirms               Anus:  normal sphincter tone, no lesions  *** chaperoned for the exam.  A:  Well Woman with normal exam  P:

## 2020-09-06 ENCOUNTER — Ambulatory Visit: Payer: 59 | Admitting: Obstetrics and Gynecology

## 2020-09-06 DIAGNOSIS — Z0289 Encounter for other administrative examinations: Secondary | ICD-10-CM

## 2020-11-15 ENCOUNTER — Emergency Department (HOSPITAL_BASED_OUTPATIENT_CLINIC_OR_DEPARTMENT_OTHER)
Admission: EM | Admit: 2020-11-15 | Discharge: 2020-11-16 | Disposition: A | Discharge status: Home / Self Care | Payer: No Typology Code available for payment source | Attending: Emergency Medicine | Admitting: Emergency Medicine

## 2020-11-15 ENCOUNTER — Encounter (HOSPITAL_BASED_OUTPATIENT_CLINIC_OR_DEPARTMENT_OTHER): Payer: Self-pay

## 2020-11-15 ENCOUNTER — Ambulatory Visit (INDEPENDENT_AMBULATORY_CARE_PROVIDER_SITE_OTHER): Payer: No Typology Code available for payment source | Admitting: Nurse Practitioner

## 2020-11-15 ENCOUNTER — Encounter: Payer: Self-pay | Admitting: Nurse Practitioner

## 2020-11-15 ENCOUNTER — Other Ambulatory Visit: Payer: Self-pay

## 2020-11-15 VITALS — BP 112/66 | Temp 99.2°F

## 2020-11-15 DIAGNOSIS — Z91040 Latex allergy status: Secondary | ICD-10-CM | POA: Insufficient documentation

## 2020-11-15 DIAGNOSIS — Z113 Encounter for screening for infections with a predominantly sexual mode of transmission: Secondary | ICD-10-CM

## 2020-11-15 DIAGNOSIS — N898 Other specified noninflammatory disorders of vagina: Secondary | ICD-10-CM | POA: Diagnosis not present

## 2020-11-15 DIAGNOSIS — R109 Unspecified abdominal pain: Secondary | ICD-10-CM | POA: Diagnosis not present

## 2020-11-15 DIAGNOSIS — Z20822 Contact with and (suspected) exposure to covid-19: Secondary | ICD-10-CM | POA: Insufficient documentation

## 2020-11-15 DIAGNOSIS — R1030 Lower abdominal pain, unspecified: Secondary | ICD-10-CM | POA: Diagnosis not present

## 2020-11-15 LAB — WET PREP FOR TRICH, YEAST, CLUE

## 2020-11-15 LAB — RESP PANEL BY RT-PCR (FLU A&B, COVID) ARPGX2
Influenza A by PCR: NEGATIVE
Influenza B by PCR: NEGATIVE
SARS Coronavirus 2 by RT PCR: NEGATIVE

## 2020-11-15 LAB — PREGNANCY, URINE: Preg Test, Ur: NEGATIVE

## 2020-11-15 MED ORDER — CEFTRIAXONE SODIUM 500 MG IJ SOLR
500.0000 mg | Freq: Once | INTRAMUSCULAR | Status: AC
  Administered 2020-11-16: 500 mg via INTRAMUSCULAR
  Filled 2020-11-15: qty 500

## 2020-11-15 MED ORDER — DOXYCYCLINE HYCLATE 100 MG PO TABS: 100.0000 mg | ORAL_TABLET | Freq: Two times a day (BID) | ORAL | 0 refills | Status: DC

## 2020-11-15 MED ORDER — LIDOCAINE HCL (PF) 1 % IJ SOLN
1.0000 mL | Freq: Once | INTRAMUSCULAR | Status: AC
  Administered 2020-11-16: 1 mL
  Filled 2020-11-15: qty 5

## 2020-11-15 NOTE — Progress Notes (Signed)
   Acute Office Visit  Subjective:    Patient ID: Alisha Edwards, female    DOB: 10/24/1990, 30 y.o.   MRN: [id number]   HPI 30 y.o. presents today for yellow vaginal discharge and pelvic discomfort. New sexual partner 5 days ago. Reports low grade fever.    Review of Systems  Constitutional: Negative.   Genitourinary:  Positive for pelvic pain and vaginal discharge. Negative for dysuria, frequency, hematuria, urgency and vaginal pain.      Objective:    Physical Exam Constitutional:      Appearance: Normal appearance.  Genitourinary:    General: Normal vulva.     Vagina: Vaginal discharge and erythema present.     Cervix: Erythema present. No cervical motion tenderness.    BP 112/66   Temp 99.2 F (37.3 C) (Oral)   LMP 10/25/2020  Wt Readings from Last 3 Encounters:  08/28/20 130 lb (59 kg)  07/05/20 130 lb (59 kg)  06/10/20 130 lb (59 kg)   Wet prep negative UA 1+ leukocytes, negative nitrates, trace blood, 1+ ketones, SG 1.015, Clear/yellow. Microscopic: wbc 0-5, rbc 0-2, many bacteria     Assessment & Plan:   Problem List Items Addressed This Visit   None Visit Diagnoses     Vaginal discharge    -  Primary   Relevant Orders   WET PREP FOR TRICH, YEAST, CLUE   Screen for STD (sexually transmitted disease)       Relevant Orders   SURESWAB CT/NG/T. vaginalis   RPR   HIV Antibody (routine testing w rflx)   Abdominal discomfort       Relevant Orders   Urinalysis w microscopic + reflex cultur      Plan: Negative wet prep. UA not suggestive of UTI, culture pending. STD panel pending.      Olivia Mackie DNP, 10:27 AM 11/15/2020

## 2020-11-15 NOTE — ED Triage Notes (Signed)
Pt c/o lower abd pain, vaginal d/c x 4 days-also c/o flu like sx x 4 days-seen by GYN-waiting for STD/urine results-NAD-steady gait

## 2020-11-15 NOTE — ED Provider Notes (Signed)
[facility] EMERGENCY DEPARTMENT Provider Note   CSN: [id number] Arrival date & time: 11/15/20  2037     History Chief Complaint  Patient presents with   Vaginal Discharge    Alisha Edwards is a 30 y.o. female.   Vaginal Discharge Associated symptoms: no abdominal pain, no dysuria, no fever, no nausea and no vomiting   Patient presents for concern of STI.  She had a new sexual partner earlier in the week.  Since that time, she has developed green vaginal discharge.  She was seen at GYN clinic earlier this morning.  Pelvic exam was performed at that time.  Swabs were sent for testing.  Wet prep was negative for Candida, BV, and trichomoniasis.  She was advised to follow-up on swab results for GC and chlamydia.  She states that the provider performing the exam did state that exam findings were consistent with STI.  Patient was not given empiric treatment for STI.  Since her visit this morning, she has had persistent discharge and has developed some lower abdominal pain.  She presents to the ED for additional evaluation.    Past Medical History:  Diagnosis Date   Anemia    Anxiety    Depression    Dysmenorrhea    Genital warts    HSV (herpes simplex virus) anogenital infection    Migraine without aura    MRSA (methicillin resistant staph aureus) culture positive 2009   thigh   STD (sexually transmitted disease)    Chlamydia    Patient Active Problem List   Diagnosis Date Noted   Intrauterine pregnancy 12/18/2019   Yeast vaginitis 04/12/2015   Mood disorder (HCC) 08/19/2013   HSV (herpes simplex virus) infection     Past Surgical History:  Procedure Laterality Date   MOUTH SURGERY     nasal surgery       OB History     Gravida  3   Para  1   Term  1   Preterm      AB  2   Living  1      SAB  1   IAB  1   Ectopic      Multiple      Live Births  1           Family History  Problem Relation Age of Onset   Hypertension Maternal  Grandmother    Breast cancer Maternal Grandmother        Age 85's   Breast cancer Paternal Grandmother        Age 82'S    Social History   Tobacco Use   Smoking status: Never   Smokeless tobacco: Never  Vaping Use   Vaping Use: Never used  Substance Use Topics   Alcohol use: Yes    Comment: occ   Drug use: No    Home Medications Prior to Admission medications   Medication Sig Start Date End Date Taking? Authorizing Provider  doxycycline (VIBRA-TABS) 100 MG tablet Take 1 tablet (100 mg total) by mouth 2 (two) times daily. 11/15/20  Yes Gloris Manchester, MD  ALPRAZolam Prudy Feeler) 0.5 MG tablet Take 0.5 mg by mouth at bedtime as needed for anxiety. Patient not taking: Reported on 11/15/2020    [provider]  nystatin-triamcinolone ointment (MYCOLOG) Apply 1 application topically 2 (two) times daily. 07/05/20   Clarita Crane, NP  valACYclovir (VALTREX) 500 MG tablet Take 1 tablet (500 mg total) by mouth daily. 06/13/20   Oscar La,  Craig Guess, MD    Allergies    Shellfish allergy and Latex  Review of Systems   Review of Systems  Constitutional:  Negative for activity change, appetite change, chills, fatigue and fever.  HENT:  Negative for congestion, ear pain and sore throat.   Eyes:  Negative for pain and visual disturbance.  Respiratory:  Negative for cough, chest tightness and shortness of breath.   Cardiovascular:  Negative for chest pain and palpitations.  Gastrointestinal:  Negative for abdominal pain, diarrhea, nausea and vomiting.  Genitourinary:  Positive for pelvic pain and vaginal discharge. Negative for dysuria, flank pain, genital sores, hematuria, urgency and vaginal bleeding.  Musculoskeletal:  Negative for arthralgias, back pain, joint swelling and myalgias.  Skin:  Negative for color change and rash.  Neurological:  Negative for dizziness, seizures, syncope, weakness, light-headedness and numbness.  All other systems reviewed and are negative.  Physical  Exam Updated Vital Signs BP 118/78 (BP Location: Left Arm)   Pulse 72   Temp 98.4 F (36.9 C) (Oral)   Resp 17   Ht 5\' 5"  (1.651 m)   Wt 59.9 kg   LMP 10/25/2020 (Approximate)   SpO2 99%   BMI 21.97 kg/m   Physical Exam Vitals and nursing note reviewed. Exam conducted with a chaperone present.  Constitutional:      General: She is not in acute distress.    Appearance: Normal appearance. She is well-developed and normal weight. She is not ill-appearing, toxic-appearing or diaphoretic.  HENT:     Head: Normocephalic and atraumatic.     Right Ear: External ear normal.     Left Ear: External ear normal.     Nose: Nose normal.     Mouth/Throat:     Mouth: Mucous membranes are moist.     Pharynx: Oropharynx is clear.  Eyes:     Extraocular Movements: Extraocular movements intact.     Conjunctiva/sclera: Conjunctivae normal.  Cardiovascular:     Rate and Rhythm: Normal rate and regular rhythm.     Heart sounds: No murmur heard. Pulmonary:     Effort: Pulmonary effort is normal. No respiratory distress.  Abdominal:     Palpations: Abdomen is soft.     Tenderness: There is no abdominal tenderness. There is no right CVA tenderness, left CVA tenderness or guarding.  Genitourinary:    General: Normal vulva.     Exam position: Lithotomy position.     Pubic Area: No rash.      Labia:        Right: No lesion.        Left: No lesion.      Vagina: No foreign body. Vaginal discharge present. No bleeding.     Cervix: Cervical motion tenderness, discharge and friability present. No erythema or cervical bleeding.  Musculoskeletal:        General: Normal range of motion.     Cervical back: Normal range of motion and neck supple.     Right lower leg: No edema.     Left lower leg: No edema.  Skin:    General: Skin is warm and dry.     Coloration: Skin is not jaundiced or pale.  Neurological:     General: No focal deficit present.     Mental Status: She is alert and oriented to  person, place, and time.     Cranial Nerves: No cranial nerve deficit.     Sensory: No sensory deficit.     Motor: No weakness.  Psychiatric:        Mood and Affect: Mood normal.        Behavior: Behavior normal.    ED Results / Procedures / Treatments   Labs (all labs ordered are listed, but only abnormal results are displayed) Labs Reviewed  RESP PANEL BY RT-PCR (FLU A&B, COVID) ARPGX2  PREGNANCY, URINE    EKG None  Radiology No results found.  Procedures Procedures   Medications Ordered in ED Medications  cefTRIAXone (ROCEPHIN) injection 500 mg (500 mg Intramuscular Given 11/16/20 0046)  lidocaine (PF) (XYLOCAINE) 1 % injection 1 mL (1 mL Other Given 11/16/20 0046)    ED Course  I have reviewed the triage vital signs and the nursing notes.  Pertinent labs & imaging results that were available during my care of the patient were reviewed by me and considered in my medical decision making (see chart for details).    MDM Rules/Calculators/A&P                          Patient is a healthy 30 year old female who presents for concern of STI.  She has had green vaginal discharge following exposure to a new sexual partner.  She has also developed lower abdominal pain.  She was evaluated at [facility] clinic earlier this morning.  She was not treated with empiric antibiotics.  She was advised to follow-up on results of GC/chlamydia testing.  Patient presents to the ED for additional evaluation.  Given that she had laboratory testing performed this morning, I do not feel that there is any benefit to repeat testing today.  Patient did request a repeat pelvic exam for assessment of signs of infection and empiric antibiotics as indicated.  Pelvic exam was performed.  Patient does have copious amount of green discharge.  There is also evidence of cervical friability and tenderness.  Given exam findings, patient was treated empirically for GC and chlamydia.  Ceftriaxone given in the ED.  Following  confirmation of negative pregnancy test, patient was prescribed doxycycline.  She was advised to follow-up with her gynecologist for results of testing from this morning.  She was discharged in good condition.  Final Clinical Impression(s) / ED Diagnoses Final diagnoses:  Vaginal discharge    Rx / DC Orders ED Discharge Orders          Ordered    doxycycline (VIBRA-TABS) 100 MG tablet  2 times daily        11/15/20 2309             Gloris Manchester, MD 11/16/20 1326

## 2020-11-16 DIAGNOSIS — N898 Other specified noninflammatory disorders of vagina: Secondary | ICD-10-CM | POA: Diagnosis not present

## 2020-11-16 LAB — SURESWAB CT/NG/T. VAGINALIS
C. trachomatis RNA, TMA: NOT DETECTED
N. gonorrhoeae RNA, TMA: NOT DETECTED
Trichomonas vaginalis RNA: NOT DETECTED

## 2020-11-16 LAB — HIV ANTIBODY (ROUTINE TESTING W REFLEX): HIV 1&2 Ab, 4th Generation: NONREACTIVE

## 2020-11-16 LAB — RPR: RPR Ser Ql: NONREACTIVE

## 2020-11-16 NOTE — Discharge Instructions (Addendum)
Follow up with your OB doctor or PCP for results of this morning's testing. Avoid sexual activity until you and your partner are fully treated.

## 2020-11-17 LAB — URINALYSIS W MICROSCOPIC + REFLEX CULTURE
Bilirubin Urine: NEGATIVE
Glucose, UA: NEGATIVE
Hyaline Cast: NONE SEEN /LPF
Nitrites, Initial: NEGATIVE
Protein, ur: NEGATIVE
Specific Gravity, Urine: 1.015 (ref 1.001–1.035)
pH: 6 (ref 5.0–8.0)

## 2020-11-17 LAB — URINE CULTURE
MICRO NUMBER:: 12469667
Result:: NO GROWTH
SPECIMEN QUALITY:: ADEQUATE

## 2020-11-17 LAB — CULTURE INDICATED

## 2020-11-19 ENCOUNTER — Other Ambulatory Visit: Payer: Self-pay | Admitting: *Deleted

## 2020-11-19 ENCOUNTER — Telehealth (HOSPITAL_BASED_OUTPATIENT_CLINIC_OR_DEPARTMENT_OTHER): Payer: Self-pay | Admitting: *Deleted

## 2020-11-19 ENCOUNTER — Other Ambulatory Visit: Payer: Self-pay | Admitting: Nurse Practitioner

## 2020-11-19 ENCOUNTER — Encounter: Payer: Self-pay | Admitting: Nurse Practitioner

## 2020-11-19 ENCOUNTER — Telehealth: Payer: Self-pay

## 2020-11-19 DIAGNOSIS — N761 Subacute and chronic vaginitis: Secondary | ICD-10-CM

## 2020-11-19 MED ORDER — CLINDAMYCIN PHOSPHATE 2 % VA CREA: TOPICAL_CREAM | VAGINAL | 0 refills | Status: DC

## 2020-11-19 NOTE — Telephone Encounter (Signed)
Transition Care Management Unsuccessful Follow-up Telephone Call  Date of discharge and from where:  11/16/2020 from [facility]  Attempts:  1st Attempt  Reason for unsuccessful TCM follow-up call:  Voice mail full

## 2020-11-19 NOTE — Telephone Encounter (Signed)
Pt called wanting diflucan for yeast infection,  Dr Audley Hose reviewed chart of Dr Durwin Nora,  diflucan was not mention in the chart and Dr Audley Hose would not give meds without seeing pt,  per Dr Audley Hose pt could try mono stat otc,  pt informed,  she stated she would return to ed tonight

## 2020-11-19 NOTE — Patient Instructions (Signed)
Visit Information  Alisha Edwards was given information about Medicaid Managed Care team care coordination services as a part of their [facility] Medicaid benefit. Alisha Edwards did not consent to engagement with the [facility] Managed Care team.   If you are experiencing a medical emergency, please call 911 or report to your local emergency department or urgent care.   If you have a non-emergency medical problem during routine business hours, please contact your provider's office and ask to speak with a nurse.   For questions related to your [facility] health plan, please call: [phone] or go here:[email or web address]  If you would like to schedule transportation through your [facility] plan, please call the following number at least 2 days in advance of your appointment: [phone].  Call the [facility] Crisis Line at [phone], at any time, 24 hours a day, 7 days a week. If you are in danger or need immediate medical attention call 911.  If you would like help to quit smoking, call 1-800-QUIT-NOW (2186359684) OR Espaol: 1-855-Djelo-Ya (2-542-706-2376) o para ms informacin haga clic aqu or Text READY to 283-151 to register via text

## 2020-11-19 NOTE — Patient Outreach (Signed)
Care Coordination  11/19/2020  Alisha Edwards 07/15/1990 [id number]  Successful outreach to patient to complete initial telephone assessment.  After reviewing patient's health history with her and the explaining the purpose of the managed Medicaid care coordination/care management collaboration program, patient did not feel she would benefit from services at this time since her health is good except for a vaginal yeast infection that she will se her provider about on 11/22/20. Patient says she doesn't remember receiving a [employer] member handbook nor is she familiar with the health plan's benefits.  Plan: With patient's agreement, this RNCM will mail patient a summary of the [facility] Managed Medicaid plan benefits. Encouraged patient to call the customer service number on her [facility] insurance card for benefit questions.  Will close referral.   Cranford Mon RN, CCM, CDCES [place]  [facility] Care Management Coordinator - Managed [place] High Risk [phone]

## 2020-11-20 NOTE — Telephone Encounter (Signed)
Transition Care Management Unsuccessful Follow-up Telephone Call  Date of discharge and from where:  11/16/2020 from [facility]  Attempts:  2nd Attempt  Reason for unsuccessful TCM follow-up call:  Voice mail full

## 2020-11-21 ENCOUNTER — Ambulatory Visit: Payer: Medicaid Other

## 2020-11-21 NOTE — Telephone Encounter (Signed)
Transition Care Management Unsuccessful Follow-up Telephone Call  Date of discharge and from where:  11/16/2020 from [facility]  Attempts:  3rd Attempt  Reason for unsuccessful TCM follow-up call:  Unable to reach patient

## 2020-11-22 ENCOUNTER — Other Ambulatory Visit: Payer: Self-pay | Admitting: Nurse Practitioner

## 2020-11-22 ENCOUNTER — Ambulatory Visit: Payer: Medicaid Other | Admitting: Obstetrics and Gynecology

## 2020-11-22 ENCOUNTER — Other Ambulatory Visit: Payer: Self-pay

## 2020-11-22 ENCOUNTER — Ambulatory Visit: Payer: Medicaid Other

## 2020-11-22 ENCOUNTER — Telehealth: Payer: Self-pay

## 2020-11-22 DIAGNOSIS — N9489 Other specified conditions associated with female genital organs and menstrual cycle: Secondary | ICD-10-CM

## 2020-11-22 MED ORDER — NORETHINDRONE ACETATE 5 MG PO TABS: 5.0000 mg | ORAL_TABLET | Freq: Three times a day (TID) | ORAL | 0 refills | Status: DC

## 2020-11-22 NOTE — Telephone Encounter (Signed)
Patient called because she would like to delay her menses that is due to start this Saturday 10/16.  She said she read online that taking Norethindrone up to 3 days before will delay it.

## 2020-11-22 NOTE — Telephone Encounter (Signed)
Norethindrone 5 mg sent to pharmacy. She is to take 3 times per day starting 3-4 days before expected period. Bleeding will start 2-3 days after stopping tablets.

## 2020-11-22 NOTE — Telephone Encounter (Signed)
Patient informed. Patient needs to have Rx sent to [facility]. I called the [place] [facility] and left message to cancel Rx and resent to her [facility] in [place].

## 2020-11-22 NOTE — Telephone Encounter (Signed)
Called patient but her voice mail box is full and I cannot leave her a message.

## 2020-11-30 ENCOUNTER — Other Ambulatory Visit: Payer: Self-pay | Admitting: Nurse Practitioner

## 2020-11-30 DIAGNOSIS — N9489 Other specified conditions associated with female genital organs and menstrual cycle: Secondary | ICD-10-CM

## 2020-11-30 NOTE — Telephone Encounter (Signed)
Refill request received for Aygestin.  Per review of telephone encounter dated 11/22/20, patient requested RX to delay menses.   Call placed to patient, Left message to call [facility] Triage at  [phone], OPT 4.

## 2020-12-31 ENCOUNTER — Telehealth: Payer: Self-pay

## 2020-12-31 NOTE — Telephone Encounter (Signed)
Patient called stating she would like Rx for birth control pills.  She said they were prescribed previously this year and she needs Rx.  I see in chart she saw Dr. Oscar La 05/09/20:  5. General counseling and advice on female contraception No contraindication to OCP's, risks reviewed - norethindrone-ethinyl estradiol (LOESTRIN FE) 1-20 MG-MCG tablet; Take 1 tablet by mouth daily.  Dispense: 3 tablet; Refill: 0 -F/U for annual exam/pill check in 3 months  Last AEX 05/20/2016 with Dr. Audie Box.

## 2020-12-31 NOTE — Telephone Encounter (Signed)
She is overdue for an annual exam and f/u, please call in one month of OCP's and get her in for an annual exam. That can be with me or with Tiffany. Thanks.

## 2021-01-01 NOTE — Telephone Encounter (Signed)
I spoke with patient and relayed Dr. Salli Quarry message. Patient said she is "tired of coming in here". She does not want to schedule visit. She said someone gave her 4 packs of Nikki (drospirenone/ethinyl estradiol) and she wants to know if okay for her to take this. She said if it is she is going to take this and at the end of the 4 packs then she will come in.  Before our conversation ended she told me she had actually already started on the [facility] but if you think she should not she will stop.

## 2021-01-01 NOTE — Telephone Encounter (Signed)
Left message to call.

## 2021-01-01 NOTE — Telephone Encounter (Signed)
She has come in for problem visits only. She hasn't had an annual exam for over 4.5 years. Her last pap was in 2017, it was due again in 2020.  She can take the pills she has, I wouldn't give her a refill until she comes for her annual exam/pap.

## 2021-01-01 NOTE — Telephone Encounter (Signed)
Spoke with patient and informed her. Appointment desk will call her to schedule AEX at her request.

## 2021-01-13 ENCOUNTER — Encounter (HOSPITAL_BASED_OUTPATIENT_CLINIC_OR_DEPARTMENT_OTHER): Payer: Self-pay

## 2021-01-13 ENCOUNTER — Other Ambulatory Visit: Payer: Self-pay

## 2021-01-13 ENCOUNTER — Emergency Department (HOSPITAL_BASED_OUTPATIENT_CLINIC_OR_DEPARTMENT_OTHER)
Admission: EM | Admit: 2021-01-13 | Discharge: 2021-01-14 | Disposition: A | Discharge status: Home / Self Care | Payer: No Typology Code available for payment source | Attending: Emergency Medicine | Admitting: Emergency Medicine

## 2021-01-13 DIAGNOSIS — Z91040 Latex allergy status: Secondary | ICD-10-CM | POA: Diagnosis not present

## 2021-01-13 DIAGNOSIS — L239 Allergic contact dermatitis, unspecified cause: Secondary | ICD-10-CM | POA: Insufficient documentation

## 2021-01-13 DIAGNOSIS — R21 Rash and other nonspecific skin eruption: Secondary | ICD-10-CM | POA: Diagnosis present

## 2021-01-13 MED ORDER — IBUPROFEN 800 MG PO TABS: 800.0000 mg | ORAL_TABLET | Freq: Three times a day (TID) | ORAL | 0 refills | Status: DC

## 2021-01-13 MED ORDER — HYDROXYZINE HCL 25 MG PO TABS
25.0000 mg | ORAL_TABLET | Freq: Four times a day (QID) | ORAL | 0 refills | Status: DC | PRN
Start: 2021-01-13 — End: 2021-05-22

## 2021-01-13 MED ORDER — METHYLPREDNISOLONE SODIUM SUCC 125 MG IJ SOLR
125.0000 mg | Freq: Once | INTRAMUSCULAR | Status: AC
  Administered 2021-01-14: 125 mg via INTRAMUSCULAR
  Filled 2021-01-13: qty 2

## 2021-01-13 MED ORDER — HYDROXYZINE HCL 10 MG PO TABS
10.0000 mg | ORAL_TABLET | Freq: Once | ORAL | Status: AC
  Administered 2021-01-14: 10 mg via ORAL
  Filled 2021-01-13: qty 1

## 2021-01-13 MED ORDER — PREDNISONE 20 MG PO TABS: ORAL_TABLET | ORAL | 0 refills | Status: DC

## 2021-01-13 MED ORDER — KETOROLAC TROMETHAMINE 60 MG/2ML IM SOLN
60.0000 mg | Freq: Once | INTRAMUSCULAR | Status: AC
  Administered 2021-01-14: 60 mg via INTRAMUSCULAR
  Filled 2021-01-13: qty 2

## 2021-01-13 NOTE — ED Triage Notes (Signed)
Patient presents with complaint of rash to posterior thighs, which she first noticed on Saturday morning.  She states she woke up with the rash around 0800 at a hotel in [place].

## 2021-01-13 NOTE — ED Notes (Signed)
Chaperoned physician during examination of rash.

## 2021-01-14 ENCOUNTER — Encounter: Payer: Self-pay | Admitting: Obstetrics & Gynecology

## 2021-01-14 ENCOUNTER — Ambulatory Visit (INDEPENDENT_AMBULATORY_CARE_PROVIDER_SITE_OTHER): Payer: No Typology Code available for payment source | Admitting: Obstetrics & Gynecology

## 2021-01-14 VITALS — BP 134/82

## 2021-01-14 DIAGNOSIS — Z7251 High risk heterosexual behavior: Secondary | ICD-10-CM

## 2021-01-14 DIAGNOSIS — Z3009 Encounter for other general counseling and advice on contraception: Secondary | ICD-10-CM

## 2021-01-14 DIAGNOSIS — L239 Allergic contact dermatitis, unspecified cause: Secondary | ICD-10-CM | POA: Diagnosis not present

## 2021-01-14 NOTE — ED Notes (Signed)
Patient discharged to home.  All discharge instructions reviewed.  Patient verbalized understanding via teachback method.  VS WDL.  Respirations even and unlabored.  Ambulatory out of ED.   °

## 2021-01-14 NOTE — ED Provider Notes (Signed)
[facility] EMERGENCY DEPARTMENT Provider Note   CSN: [id number] Arrival date & time: 01/13/21  2324     History Chief Complaint  Patient presents with   Rash    Alisha Edwards is a 30 y.o. female.  30 year old female who presents to the emerged part today with bilateral posterior thigh rash.  Patient states that she wore dress and flew on an airplane down to [place] on Friday.  Few hours after she got there she noticed that she was having some itching to her posterior thighs.  She woke up the next morning was significantly worse.  She states that there is a rash there that she initially thought might be a some type of bites however they got bigger and got more raised and coalesced.  She states that it is itching and burning and painful throughout the last 24 to 36 hours.  She tried Benadryl which helped some but not fully.  Did not try thing else for symptoms.  No nausea, vomiting, diarrhea.  No fevers.   Rash     Past Medical History:  Diagnosis Date   Anemia    Anxiety    Depression    Dysmenorrhea    Genital warts    HSV (herpes simplex virus) anogenital infection    Migraine without aura    MRSA (methicillin resistant staph aureus) culture positive 2009   thigh   STD (sexually transmitted disease)    Chlamydia    Patient Active Problem List   Diagnosis Date Noted   Intrauterine pregnancy 12/18/2019   Yeast vaginitis 04/12/2015   Mood disorder (HCC) 08/19/2013   HSV (herpes simplex virus) infection     Past Surgical History:  Procedure Laterality Date   MOUTH SURGERY     nasal surgery       OB History     Gravida  3   Para  1   Term  1   Preterm      AB  2   Living  1      SAB  1   IAB  1   Ectopic      Multiple      Live Births  1           Family History  Problem Relation Age of Onset   Hypertension Maternal Grandmother    Breast cancer Maternal Grandmother        Age 42's   Breast cancer Paternal Grandmother         Age 22'S    Social History   Tobacco Use   Smoking status: Never   Smokeless tobacco: Never  Vaping Use   Vaping Use: Never used  Substance Use Topics   Alcohol use: Yes    Comment: occ   Drug use: No    Home Medications Prior to Admission medications   Medication Sig Start Date End Date Taking? Authorizing Provider  hydrOXYzine (ATARAX) 25 MG tablet Take 1 tablet (25 mg total) by mouth every 6 (six) hours as needed. 01/13/21  Yes [place] Dube, Barbara Cower, MD  ibuprofen (ADVIL) 800 MG tablet Take 1 tablet (800 mg total) by mouth 3 (three) times daily. 01/13/21  Yes Ranata Laughery, Barbara Cower, MD  predniSONE (DELTASONE) 20 MG tablet 3 tabs po daily x 3 days, then 2 tabs x 3 days, then 1.5 tabs x 3 days, then 1 tab x 3 days, then 0.5 tabs x 3 days 01/13/21  Yes Wylee Dorantes, Barbara Cower, MD  norethindrone (AYGESTIN) 5 MG tablet Take  1 tablet (5 mg total) by mouth in the morning, at noon, and at bedtime for 5 days. Take 1 tablet three times per day starting 3-4 days before expected period 11/22/20 11/27/20  Olivia Mackie, NP  nystatin-triamcinolone ointment (MYCOLOG) Apply 1 application topically 2 (two) times daily. 07/05/20   Clarita Crane, NP  valACYclovir (VALTREX) 500 MG tablet Take 1 tablet (500 mg total) by mouth daily. 06/13/20   Romualdo Bolk, MD    Allergies    Shellfish allergy and Latex  Review of Systems   Review of Systems  Skin:  Positive for rash.  All other systems reviewed and are negative.  Physical Exam Updated Vital Signs BP 108/74 (BP Location: Right Arm)   Pulse 82   Temp 98.2 F (36.8 C) (Oral)   Resp 16   Ht 5\' 3"  (1.6 m)   Wt 59 kg   LMP 01/11/2021 (Exact Date)   SpO2 100%   BMI 23.03 kg/m   Physical Exam Vitals and nursing note reviewed.  Constitutional:      Appearance: She is well-developed.  HENT:     Head: Normocephalic and atraumatic.     Mouth/Throat:     Mouth: Mucous membranes are moist.     Pharynx: Oropharynx is clear.  Eyes:     Pupils: Pupils are  equal, round, and reactive to light.  Cardiovascular:     Rate and Rhythm: Normal rate and regular rhythm.  Pulmonary:     Effort: No respiratory distress.     Breath sounds: No stridor.  Abdominal:     General: Abdomen is flat. There is no distension.  Musculoskeletal:     Cervical back: Normal range of motion.  Skin:    General: Skin is warm and dry.     Findings: Rash (Bilateral posterior thigh with erythematous raised urticarial rash with excoriations some areas that have a linear component) present.  Neurological:     General: No focal deficit present.     Mental Status: She is alert.    ED Results / Procedures / Treatments   Labs (all labs ordered are listed, but only abnormal results are displayed) Labs Reviewed - No data to display  EKG None  Radiology No results found.  Procedures Procedures   Medications Ordered in ED Medications  methylPREDNISolone sodium succinate (SOLU-MEDROL) 125 mg/2 mL injection 125 mg (125 mg Intramuscular Given 01/14/21 0002)  hydrOXYzine (ATARAX) tablet 10 mg (10 mg Oral Given 01/14/21 0002)  ketorolac (TORADOL) injection 60 mg (60 mg Intramuscular Given 01/14/21 0002)    ED Course  I have reviewed the triage vital signs and the nursing notes.  Pertinent labs & imaging results that were available during my care of the patient were reviewed by me and considered in my medical decision making (see chart for details).    MDM Rules/Calculators/A&P                         Patient's rash seems to be consistent with a contact dermatitis of some sort.  Suspect it was simply she came in contact with at the airport or on the airlines.  We will treat with a prednisone taper, Atarax and NSAIDs for pain.  Low suspicion for cellulitis or anaphylaxis without other symptoms.   Final Clinical Impression(s) / ED Diagnoses Final diagnoses:  Allergic contact dermatitis, unspecified trigger    Rx / DC Orders ED Discharge Orders  Ordered     predniSONE (DELTASONE) 20 MG tablet        01/13/21 2356    hydrOXYzine (ATARAX) 25 MG tablet  Every 6 hours PRN        01/13/21 2356    ibuprofen (ADVIL) 800 MG tablet  3 times daily        01/13/21 2356             Princessa Lesmeister, Barbara Cower, MD 01/14/21 0007

## 2021-01-14 NOTE — Progress Notes (Signed)
    Alisha Edwards December 14, 1990 [id number]        30 y.o.  G3P1A2L1   RP: Counseling on DepoProvera  HPI: Non-compliant to BCPs, forgetting to take the pills frequently.  For that reason, would like to switch to DepoProvera.  Unprotected IC, last time on 01/07/2021, which is a week ago.  No BCPs x last 3 days.  Frequent BTB, including currently.   OB History  Gravida Para Term Preterm AB Living  3 1 1   2 1   SAB IAB Ectopic Multiple Live Births  1 1     1     # Outcome Date GA Lbr Len/2nd Weight Sex Delivery Anes PTL Lv  3 IAB 02/09/20 [redacted]w[redacted]d         2 Term 01/25/11 [redacted]w[redacted]d 11:01 / 01:21 7 lb 1.6 oz (3.221 kg) F Vag-Spont EPI  LIV  1 SAB             Past medical history,surgical history, problem list, medications, allergies, family history and social history were all reviewed and documented in the EPIC chart.   Directed ROS with pertinent positives and negatives documented in the history of present illness/assessment and plan.  Exam:  Vitals:   01/14/21 1411  BP: 134/82   General appearance:  Normal  Gyn exam deferred   Assessment/Plan:  30 y.o. 30/05/22   1. General counseling and advice on female contraception Poor compliance to BCPs.  Unprotected IC a week ago.  Depo-Provera contraception is a good option, but patient needs 2 weeks of protected IC or abstinence for a valid UPT/SPT before the first dose.  Patient became very impatient and short with me because she was not obtaining what she wanted.  The visit had to be terminated at that point for that reason.  Patient was brought to 26 to manage patient's dissatisfaction.  2. Unprotected sexual intercourse  Unprotected IC a week ago.  Recommend using condoms or abstaining x an additional week and then coming back for a UPT or SPT.  If negative, can receive her first DepoProvera injection that day.  [id number] MD, 2:18 PM 01/14/2021

## 2021-01-22 NOTE — Progress Notes (Deleted)
30 y.o. [id number] Single Black or African American Not Hispanic or Latino female here for annual exam.      Patient's last menstrual period was 01/11/2021 (exact date).          Sexually active: {yes no:314532}  The current method of family planning is {contraception:315051}.    Exercising: {yes no:314532}  {types:19826} Smoker:  {YES [id number]  Health Maintenance: Pap:  04/12/15 WNL, 06/12/11 normal  History of abnormal Pap:  {YES NO:22349} MMG:  n/a BMD:   n/a Colonoscopy: n/a TDaP:  09/18/15 Gardasil: x1 in 2017    reports that she has never smoked. She has never used smokeless tobacco. She reports current alcohol use. She reports that she does not use drugs.  Past Medical History:  Diagnosis Date   Anemia    Anxiety    Depression    Dysmenorrhea    Genital warts    HSV (herpes simplex virus) anogenital infection    Migraine without aura    MRSA (methicillin resistant staph aureus) culture positive 2009   thigh   STD (sexually transmitted disease)    Chlamydia    Past Surgical History:  Procedure Laterality Date   MOUTH SURGERY     nasal surgery      Current Outpatient Medications  Medication Sig Dispense Refill   hydrOXYzine (ATARAX) 25 MG tablet Take 1 tablet (25 mg total) by mouth every 6 (six) hours as needed. 12 tablet 0   ibuprofen (ADVIL) 800 MG tablet Take 1 tablet (800 mg total) by mouth 3 (three) times daily. 21 tablet 0   norethindrone (AYGESTIN) 5 MG tablet Take 1 tablet (5 mg total) by mouth in the morning, at noon, and at bedtime for 5 days. Take 1 tablet three times per day starting 3-4 days before expected period 28 tablet 0   nystatin-triamcinolone ointment (MYCOLOG) Apply 1 application topically 2 (two) times daily. 30 g 2   predniSONE (DELTASONE) 20 MG tablet 3 tabs po daily x 3 days, then 2 tabs x 3 days, then 1.5 tabs x 3 days, then 1 tab x 3 days, then 0.5 tabs x 3 days 27 tablet 0   valACYclovir (VALTREX) 500 MG tablet Take 1 tablet (500 mg total) by  mouth daily. 90 tablet 0   No current facility-administered medications for this visit.    Family History  Problem Relation Age of Onset   Hypertension Maternal Grandmother    Breast cancer Maternal Grandmother        Age 35's   Breast cancer Paternal Grandmother        Age 47'S    Review of Systems  Exam:   LMP 01/11/2021 (Exact Date)   Weight change: @WEIGHTCHANGE @ Height:      Ht Readings from Last 3 Encounters:  01/13/21 5\' 3"  (1.6 m)  11/15/20 5\' 5"  (1.651 m)  08/28/20 5\' 4"  (1.626 m)    General appearance: alert, cooperative and appears stated age Head: Normocephalic, without obvious abnormality, atraumatic Neck: no adenopathy, supple, symmetrical, trachea midline and thyroid {CHL AMB PHY EX THYROID NORM [phone]::"normal to inspection and palpation"} Lungs: clear to auscultation bilaterally Cardiovascular: regular rate and rhythm Breasts: {Exam; breast:13139::"normal appearance, no masses or tenderness"} Abdomen: soft, non-tender; non distended,  no masses,  no organomegaly Extremities: extremities normal, atraumatic, no cyanosis or edema Skin: Skin color, texture, turgor normal. No rashes or lesions Lymph nodes: Cervical, supraclavicular, and axillary nodes normal. No abnormal inguinal nodes palpated Neurologic: Grossly normal   Pelvic: External genitalia:  no lesions              Urethra:  normal appearing urethra with no masses, tenderness or lesions              Bartholins and Skenes: normal                 Vagina: normal appearing vagina with normal color and discharge, no lesions              Cervix: {CHL AMB PHY EX CERVIX NORM DEFAULT:[phone]::"no lesions"}               Bimanual Exam:  Uterus:  {CHL AMB PHY EX UTERUS NORM DEFAULT:[phone]::"normal size, contour, position, consistency, mobility, non-tender"}              Adnexa: {CHL AMB PHY EX ADNEXA NO MASS [phone]::"no mass, fullness, tenderness"}               Rectovaginal:  Confirms               Anus:  normal sphincter tone, no lesions  *** chaperoned for the exam.  A:  Well Woman with normal exam  P:

## 2021-01-24 ENCOUNTER — Ambulatory Visit: Payer: Medicaid Other | Admitting: Obstetrics and Gynecology

## 2021-04-27 ENCOUNTER — Telehealth (HOSPITAL_COMMUNITY): Payer: Self-pay | Admitting: Psychiatry

## 2021-05-22 ENCOUNTER — Ambulatory Visit (INDEPENDENT_AMBULATORY_CARE_PROVIDER_SITE_OTHER): Payer: No Typology Code available for payment source | Admitting: Family

## 2021-05-22 ENCOUNTER — Ambulatory Visit (INDEPENDENT_AMBULATORY_CARE_PROVIDER_SITE_OTHER)
Admission: RE | Admit: 2021-05-22 | Discharge: 2021-05-22 | Disposition: A | Discharge status: Home / Self Care | Payer: No Typology Code available for payment source | Source: Ambulatory Visit | Attending: Family | Admitting: Family

## 2021-05-22 ENCOUNTER — Encounter: Payer: Self-pay | Admitting: *Deleted

## 2021-05-22 ENCOUNTER — Encounter: Payer: Self-pay | Admitting: Family

## 2021-05-22 VITALS — BP 108/68 | HR 76 | Temp 99.2°F | Resp 16 | Ht 63.0 in | Wt 131.6 lb

## 2021-05-22 DIAGNOSIS — R42 Dizziness and giddiness: Secondary | ICD-10-CM | POA: Insufficient documentation

## 2021-05-22 DIAGNOSIS — A6009 Herpesviral infection of other urogenital tract: Secondary | ICD-10-CM | POA: Diagnosis not present

## 2021-05-22 DIAGNOSIS — R739 Hyperglycemia, unspecified: Secondary | ICD-10-CM | POA: Insufficient documentation

## 2021-05-22 DIAGNOSIS — R200 Anesthesia of skin: Secondary | ICD-10-CM | POA: Diagnosis not present

## 2021-05-22 DIAGNOSIS — Z3042 Encounter for surveillance of injectable contraceptive: Secondary | ICD-10-CM | POA: Diagnosis not present

## 2021-05-22 DIAGNOSIS — M542 Cervicalgia: Secondary | ICD-10-CM | POA: Insufficient documentation

## 2021-05-22 DIAGNOSIS — B009 Herpesviral infection, unspecified: Secondary | ICD-10-CM | POA: Diagnosis not present

## 2021-05-22 DIAGNOSIS — D649 Anemia, unspecified: Secondary | ICD-10-CM

## 2021-05-22 DIAGNOSIS — F418 Other specified anxiety disorders: Secondary | ICD-10-CM

## 2021-05-22 DIAGNOSIS — M25531 Pain in right wrist: Secondary | ICD-10-CM | POA: Diagnosis not present

## 2021-05-22 DIAGNOSIS — A6000 Herpesviral infection of urogenital system, unspecified: Secondary | ICD-10-CM | POA: Insufficient documentation

## 2021-05-22 DIAGNOSIS — M79641 Pain in right hand: Secondary | ICD-10-CM | POA: Diagnosis not present

## 2021-05-22 DIAGNOSIS — Z803 Family history of malignant neoplasm of breast: Secondary | ICD-10-CM | POA: Insufficient documentation

## 2021-05-22 DIAGNOSIS — F39 Unspecified mood [affective] disorder: Secondary | ICD-10-CM

## 2021-05-22 DIAGNOSIS — L409 Psoriasis, unspecified: Secondary | ICD-10-CM

## 2021-05-22 LAB — CBC WITH DIFFERENTIAL/PLATELET
Basophils Absolute: 0.1 10*3/uL (ref 0.0–0.1)
Basophils Relative: 0.9 % (ref 0.0–3.0)
Eosinophils Absolute: 0.1 10*3/uL (ref 0.0–0.7)
Eosinophils Relative: 1.3 % (ref 0.0–5.0)
HCT: 40 % (ref 36.0–46.0)
Hemoglobin: 13.5 g/dL (ref 12.0–15.0)
Lymphocytes Relative: 58.6 % — ABNORMAL HIGH (ref 12.0–46.0)
Lymphs Abs: 3.4 10*3/uL (ref 0.7–4.0)
MCHC: 33.7 g/dL (ref 30.0–36.0)
MCV: 91.3 fl (ref 78.0–100.0)
Monocytes Absolute: 0.5 10*3/uL (ref 0.1–1.0)
Monocytes Relative: 8.2 % (ref 3.0–12.0)
Neutro Abs: 1.8 10*3/uL (ref 1.4–7.7)
Neutrophils Relative %: 31 % — ABNORMAL LOW (ref 43.0–77.0)
Platelets: 262 10*3/uL (ref 150.0–400.0)
RBC: 4.38 Mil/uL (ref 3.87–5.11)
RDW: 12.8 % (ref 11.5–15.5)
WBC: 5.8 10*3/uL (ref 4.0–10.5)

## 2021-05-22 LAB — BASIC METABOLIC PANEL
BUN: 16 mg/dL (ref 6–23)
CO2: 29 mEq/L (ref 19–32)
Calcium: 9.5 mg/dL (ref 8.4–10.5)
Chloride: 103 mEq/L (ref 96–112)
Creatinine, Ser: 0.97 mg/dL (ref 0.40–1.20)
GFR: 78.27 mL/min (ref >60.00)
Glucose, Bld: 77 mg/dL (ref 70–99)
Potassium: 4.4 mEq/L (ref 3.5–5.1)
Sodium: 139 mEq/L (ref 135–145)

## 2021-05-22 LAB — HEMOGLOBIN A1C: Hgb A1c MFr Bld: 5.6 % (ref 4.6–6.5)

## 2021-05-22 LAB — VITAMIN B12: Vitamin B-12: 317 pg/mL (ref 211–911)

## 2021-05-22 LAB — TSH: TSH: 0.98 u[IU]/mL (ref 0.35–5.50)

## 2021-05-22 MED ORDER — DESONIDE 0.05 % EX CREA: TOPICAL_CREAM | Freq: Two times a day (BID) | CUTANEOUS | 0 refills | Status: AC

## 2021-05-22 MED ORDER — CYCLOBENZAPRINE HCL 10 MG PO TABS: ORAL_TABLET | ORAL | 0 refills | Status: DC

## 2021-05-22 MED ORDER — VALACYCLOVIR HCL 500 MG PO TABS: 500.0000 mg | ORAL_TABLET | Freq: Every day | ORAL | 0 refills | Status: DC

## 2021-05-22 NOTE — Assessment & Plan Note (Signed)
rx valtrex 500 mg  ?

## 2021-05-22 NOTE — Assessment & Plan Note (Signed)
X-ray of the hand ordered pending results ?

## 2021-05-22 NOTE — Assessment & Plan Note (Signed)
Viewed EKG from 08/2020 normal sinus rhythm ?Discussed acute and chronic findings with patient ?Discussed making sure patient to eat every 2-3 hours, differential between hypoglycemia and or dehydration ?Labs ordered for workup ?

## 2021-05-22 NOTE — Assessment & Plan Note (Signed)
Ordering CBC pending results 

## 2021-05-22 NOTE — Assessment & Plan Note (Signed)
Continue f/u with psychiatrist and psychologist ?cotinue xanax 0.25 as prescribed  ?

## 2021-05-22 NOTE — Assessment & Plan Note (Signed)
Rx desonide 0.05% ?Ambulatory referral to dermatology placed ?

## 2021-05-22 NOTE — Assessment & Plan Note (Signed)
Continue with [facility] department for depo provera refills ?

## 2021-05-22 NOTE — Assessment & Plan Note (Signed)
Cont f/u with psychiatrist  ?

## 2021-05-22 NOTE — Assessment & Plan Note (Signed)
Rx sent in for Valtrex 500 mg ?

## 2021-05-22 NOTE — Assessment & Plan Note (Signed)
X-ray of the neck ordered and pending ?Advised patient to work on posture ?Heat and ice as needed Rx Flexeril 10 mg as needed ? ?

## 2021-05-22 NOTE — Progress Notes (Signed)
? ?New Patient Office Visit ? ?Subjective:  ?Patient ID: Alisha Edwards, female    DOB: [birth year]  Age: 31 y.o. MRN: [id number] ? ?CC:  ?Chief Complaint  ?Patient presents with  ? Establish Care  ? ? ?HPI ?Alisha Edwards is here to establish care as a new patient. ? ?Prior provider was: [facility] [place]  ?Pt is without acute concerns.  ? ?Pap: when started depo. Last in 12/2020 negative per pt. On every three month depo. Due next year, likes annual pap.  ? ?Over ten years ago with lightheadedness, dizziness, and felt like going to faint. Had workup  ?Three weeks ago started sweating really bad, felt dizzy and faint, vision started to close in while standing and felt like she was going to vomit. Sat down and started to feel slightly better, but got back up and vision started fading out.  ? She did have a workup and was concerned with heat stroke vs hypoglycemia.  ? At this time of episode no chest pain, palpitations or shortness of breath.  ? Had not eaten in the last few hours but did drink plenty of water. Was not outside in the heat.  ? ?chronic concerns: ? ?HSV2: takes valtrex as needed, usually will take if she feels prodomal symptoms, usually once a month but not always with lesions. No recent lesions in over 15 years. Valtrex 500.  ? ?Depression with anxiety, with mood disorder: sees psychiatrist, takes xanax 2-3 times a week on average. Sees psychiatrist every one months or as needed. Denies SI HI seeing psychologist once a week/ going for outpatient therapy program with group therapy.  ? ?Right hand, tips of fingers with tingling in the fingertips. Braids her families hair often, and woke up one am about one month ago and having trouble with grip strength and twisting stuff. Feels numb to touch but can feel pressure. No neck pain. Does also type often at work does have some pulling sensation in the right wrist.  ? ?Past Medical History:  ?Diagnosis Date  ? Anemia   ? Anxiety   ? Depression   ?  Dysmenorrhea   ? Genital warts   ? HSV (herpes simplex virus) anogenital infection   ? Migraine without aura   ? MRSA (methicillin resistant staph aureus) culture positive 2009  ? thigh  ? STD (sexually transmitted disease)   ? Chlamydia  ? ? ?Past Surgical History:  ?Procedure Laterality Date  ? nasal surgery    ? bil nares with cautery  ? WISDOM TOOTH EXTRACTION    ? ? ?Family History  ?Problem Relation Age of Onset  ? Hypertension Mother   ? Thyroid disease Father   ? Mental illness Sister   ? Mental illness Brother   ? Mental illness Brother   ? Depression Brother   ? Mental illness Brother   ? Mental illness Brother   ? Bipolar disorder Brother   ? Anxiety disorder Brother   ? Depression Brother   ? Hypertension Maternal Grandmother   ? Breast cancer Maternal Grandmother   ?     Age 81's  ? Breast cancer Paternal Grandmother   ?     Age 70'S  ? Cancer Paternal Grandfather   ?     Nasal  ? Mental illness Half-Sister   ? Mood Disorder Half-Sister   ? ? ?Social History  ? ?Socioeconomic History  ? Marital status: Single  ?  Spouse name: Not on file  ? Number  of children: 1  ? Years of education: Not on file  ? Highest education level: Not on file  ?Occupational History  ?  Employer: [employer]   ?Tobacco Use  ? Smoking status: Never  ? Smokeless tobacco: Never  ?Vaping Use  ? Vaping Use: Former  ?Substance and Sexual Activity  ? Alcohol use: Yes  ?  Comment: occ  ? Drug use: No  ? Sexual activity: Yes  ?  Birth control/protection: None, Injection  ?  Comment: 1ST INTERCOURSE- 389, PARTNERS- 7  ?Other Topics Concern  ? Not on file  ?Social History Narrative  ? One daughter, 31 years old   ? ?Social Determinants of Health  ? ?Financial Resource Strain: Not on file  ?Food Insecurity: Not on file  ?Transportation Needs: Not on file  ?Physical Activity: Not on file  ?Stress: Not on file  ?Social Connections: Not on file  ?Intimate Partner Violence: Not on file  ? ? ?Outpatient Medications Prior to Visit   ?Medication Sig Dispense Refill  ? ALPRAZolam (XANAX) 0.25 MG tablet Take 0.25 mg by mouth at bedtime as needed.    ? medroxyPROGESTERone (DEPO-PROVERA) 150 MG/ML injection Inject 150 mg into the muscle every 3 (three) months.    ? hydrOXYzine (ATARAX) 25 MG tablet Take 1 tablet (25 mg total) by mouth every 6 (six) hours as needed. 12 tablet 0  ? ibuprofen (ADVIL) 800 MG tablet Take 1 tablet (800 mg total) by mouth 3 (three) times daily. 21 tablet 0  ? valACYclovir (VALTREX) 500 MG tablet Take 1 tablet (500 mg total) by mouth daily. 90 tablet 0  ? norethindrone (AYGESTIN) 5 MG tablet Take 1 tablet (5 mg total) by mouth in the morning, at noon, and at bedtime for 5 days. Take 1 tablet three times per day starting 3-4 days before expected period 28 tablet 0  ? nystatin-triamcinolone ointment (MYCOLOG) Apply 1 application topically 2 (two) times daily. (Patient not taking: Reported on 05/22/2021) 30 g 2  ? predniSONE (DELTASONE) 20 MG tablet 3 tabs po daily x 3 days, then 2 tabs x 3 days, then 1.5 tabs x 3 days, then 1 tab x 3 days, then 0.5 tabs x 3 days (Patient not taking: Reported on 05/22/2021) 27 tablet 0  ? ?No facility-administered medications prior to visit.  ? ? ?Allergies  ?Allergen Reactions  ? Shellfish Allergy Itching and Swelling  ?  Mouth swells and itches  ? Latex Rash  ? ? ?ROS ?Review of Systems  ?Constitutional:  Negative for chills, fatigue, fever and unexpected weight change.  ?Eyes:  Negative for visual disturbance.  ?Respiratory:  Negative for shortness of breath.   ?Cardiovascular:  Negative for chest pain.  ?Gastrointestinal:  Negative for abdominal pain.  ?Genitourinary:  Negative for difficulty urinating.  ?Musculoskeletal:  Positive for arthralgias (occasional right wrist pain with decreased grip strength).  ?Skin:  Negative for rash.  ?Neurological:  Positive for dizziness (over ten years ago with dizziness episodes) and numbness (tingling/numbness right tips of fingers). Negative for  headaches.  ? ? ?  ?Objective:  ?  ?Physical Exam ?Vitals reviewed.  ?Constitutional:   ?   General: She is not in acute distress. ?   Appearance: Normal appearance. She is normal weight. She is not ill-appearing, toxic-appearing or diaphoretic.  ?HENT:  ?   Right Ear: Tympanic membrane normal.  ?   Left Ear: Tympanic membrane normal.  ?   Mouth/Throat:  ?   Mouth: Mucous membranes are moist.  ?  Pharynx: No pharyngeal swelling.  ?   Tonsils: No tonsillar exudate.  ?Eyes:  ?   Extraocular Movements: Extraocular movements intact.  ?   Conjunctiva/sclera: Conjunctivae normal.  ?   Pupils: Pupils are equal, round, and reactive to light.  ?Neck:  ?   Thyroid: No thyroid mass.  ?Cardiovascular:  ?   Rate and Rhythm: Normal rate and regular rhythm.  ?Pulmonary:  ?   Effort: Pulmonary effort is normal.  ?   Breath sounds: Normal breath sounds.  ?Abdominal:  ?   General: Abdomen is flat. Bowel sounds are normal.  ?   Palpations: Abdomen is soft.  ?Musculoskeletal:     ?   General: Normal range of motion.  ?   Right upper arm: No edema, tenderness or bony tenderness.  ?   Right wrist: Snuff box tenderness present.  ?   Left wrist: Normal.  ?   Right hand: Normal range of motion. Decreased strength (slight very mild).  ?   Cervical back: No rigidity or crepitus. Pain with movement (with hyperextension mild on flexion) and muscular tenderness (left lower posterior neck with tightness and spasm) present.  ?   Comments: Right wrist positive phalens and positive allens  ?Lymphadenopathy:  ?   Cervical:  ?   Right cervical: No superficial cervical adenopathy. ?   Left cervical: No superficial cervical adenopathy.  ?Skin: ?   General: Skin is warm.  ?   Capillary Refill: Capillary refill takes less than 2 seconds.  ?Neurological:  ?   General: No focal deficit present.  ?   Mental Status: She is alert and oriented to person, place, and time.  ?   Cranial Nerves: No cranial nerve deficit.  ?   Motor: No weakness.  ?   Gait: Gait  normal.  ?Psychiatric:     ?   Mood and Affect: Mood normal.     ?   Behavior: Behavior normal.     ?   Thought Content: Thought content normal.     ?   Judgment: Judgment normal.  ? ? ? ? ?BP 108/68   Pulse 76   Tem

## 2021-05-22 NOTE — Assessment & Plan Note (Signed)
A1c ordered pending results ?

## 2021-05-22 NOTE — Assessment & Plan Note (Signed)
X-ray of right wrist ordered pending results ?

## 2021-05-22 NOTE — Patient Instructions (Addendum)
A referral was placed today for dermatology ?Please let us know if you have not heard back within 2 weeks about the referral. ? ?Stop by the lab prior to leaving today. I will notify you of your results once received.  ? ?Make sure eating every two to three hours, drink 5-7 glasses of water.  ? ?Complete xray(s) prior to leaving today. I will notify you of your results once received. ? ?Welcome to our clinic, I am happy to have you as my new patient. I am excited to continue on this healthcare journey with you. ? ?Please keep in mind ?Any my chart messages you send have p to a three business day turnaround for a response.  ?Phone calls may have up to a one day business turnaround for a  response.  ? ?If you need a medication refill I recommend you request it through the pharmacy as this is easiest for [place] rather than sending a message and or phone call.  ? ?Due to recent changes in healthcare laws, you may see results of your imaging and/or laboratory studies on MyChart before I have had a chance to review them.  I understand that in some cases there may be results that are confusing or concerning to you. Please understand that not all results are received at the same time and often I may need to interpret multiple results in order to provide you with the best plan of care or course of treatment. Therefore, I ask that you please give me 2 business days to thoroughly review all your results before contacting my office for clarification. Should we see a critical lab result, you will be contacted sooner.  ? ?Due to recent changes in healthcare laws, you may see results of your imaging and/or laboratory studies on MyChart before I have had a chance to review them.  I understand that in some cases there may be results that are confusing or concerning to you. Please understand that not all results are received at the same time and often I may need to interpret multiple results in order to provide you with the best plan of  care or course of treatment. Therefore, I ask that you please give me 2 business days to thoroughly review all your results before contacting my office for clarification. Should we see a critical lab result, you will be contacted sooner.  ? ?It was a pleasure seeing you today! Please do not hesitate to reach out with any questions and or concerns. ? ?Regards,  ? ?Asmara Backs ?FNP-C ? ? ?

## 2021-05-22 NOTE — Assessment & Plan Note (Signed)
We will keep this in mind not a candidate currently at age of onset of breast cancer with grandparents for early screening ?However did discuss daily breast examination ?

## 2021-05-22 NOTE — Assessment & Plan Note (Signed)
Suspected neck radiating pain and or carpal tunnel syndrome right hand ?Advised patient to try wearing a brace on a daily basis especially at nighttime and to wear compression sleeve during the day when he overuses them. ?

## 2021-05-23 ENCOUNTER — Other Ambulatory Visit: Payer: Self-pay | Admitting: Family

## 2021-05-23 DIAGNOSIS — R7989 Other specified abnormal findings of blood chemistry: Secondary | ICD-10-CM

## 2021-05-23 NOTE — Progress Notes (Signed)
Are you currently feeling sick or anything?  Your neutrophils were slightly decreased and lymphocytes elevated this can likely sometimes resemble a bacterial versus viral infection.  We can repeat this in 1 month.  Thyroid is okay.  B12 is on the lower end so I do recommend an over-the-counter vitamin B12 1000 mcg once daily tablet.  kidney function looks good.  ? ?Ordered placed for repeat cbc have pt schedule lab only in one month and otherwise three month f/u for office visit

## 2021-06-02 ENCOUNTER — Other Ambulatory Visit: Payer: Self-pay

## 2021-06-02 ENCOUNTER — Encounter (HOSPITAL_BASED_OUTPATIENT_CLINIC_OR_DEPARTMENT_OTHER): Payer: Self-pay | Admitting: Emergency Medicine

## 2021-06-02 ENCOUNTER — Emergency Department (HOSPITAL_BASED_OUTPATIENT_CLINIC_OR_DEPARTMENT_OTHER)
Admission: EM | Admit: 2021-06-02 | Discharge: 2021-06-02 | Disposition: A | Discharge status: Home / Self Care | Payer: No Typology Code available for payment source | Attending: Emergency Medicine | Admitting: Emergency Medicine

## 2021-06-02 DIAGNOSIS — Z91040 Latex allergy status: Secondary | ICD-10-CM | POA: Insufficient documentation

## 2021-06-02 DIAGNOSIS — F17200 Nicotine dependence, unspecified, uncomplicated: Secondary | ICD-10-CM | POA: Diagnosis not present

## 2021-06-02 DIAGNOSIS — J029 Acute pharyngitis, unspecified: Secondary | ICD-10-CM | POA: Diagnosis present

## 2021-06-02 LAB — GROUP A STREP BY PCR: Group A Strep by PCR: NOT DETECTED

## 2021-06-02 MED ORDER — LIDOCAINE VISCOUS HCL 2 % MT SOLN
15.0000 mL | Freq: Once | OROMUCOSAL | Status: AC
Start: 2021-06-02 — End: 2021-06-02
  Administered 2021-06-02: 15 mL via OROMUCOSAL
  Filled 2021-06-02: qty 15

## 2021-06-02 MED ORDER — DEXAMETHASONE SODIUM PHOSPHATE 10 MG/ML IJ SOLN
10.0000 mg | Freq: Once | INTRAMUSCULAR | Status: AC
  Administered 2021-06-02: 10 mg via INTRAMUSCULAR
  Filled 2021-06-02: qty 1

## 2021-06-02 MED ORDER — LIDOCAINE VISCOUS HCL 2 % MT SOLN: 15.0000 mL | OROMUCOSAL | 0 refills | Status: DC | PRN

## 2021-06-02 NOTE — Discharge Instructions (Signed)
Check my chart for sterp result. The steroids should help reduce the swelling. Swish the viscous lidocaine for sore throat. Follow up with PCP if symptoms persist. Return to ED if things change or worsne.  ?

## 2021-06-02 NOTE — ED Provider Notes (Signed)
?[facility] EMERGENCY DEPARTMENT ?Provider Note ? ? ?CSN: [id number] ?Arrival date & time: 06/02/21  1828 ? ?  ? ?History ? ?Chief Complaint  ?Patient presents with  ? Sore Throat  ? ? ?Alisha Edwards is a 31 y.o. female. ? ? ?Sore Throat ? ? ?Patient presents with sore throat x3 days.  Happened after being enclosed in a room filled with smoke. Time is not been improving her symptoms, states it feels like previous tonsil stones.  Denies any fevers, uri symptoms, voice changes.  ? ?Home Medications ?Prior to Admission medications   ?Medication Sig Start Date End Date Taking? Authorizing Provider  ?ALPRAZolam (XANAX) 0.25 MG tablet Take 0.25 mg by mouth at bedtime as needed.    [provider]  ?cyclobenzaprine (FLEXERIL) 10 MG tablet Take one po qhs prn muscle spasm 05/22/21   Mort Sawyers, FNP  ?medroxyPROGESTERone (DEPO-PROVERA) 150 MG/ML injection Inject 150 mg into the muscle every 3 (three) months.    [provider]  ?valACYclovir (VALTREX) 500 MG tablet Take 1 tablet (500 mg total) by mouth daily. 05/22/21   Mort Sawyers, FNP  ?   ? ?Allergies    ?Shellfish allergy and Latex   ? ?Review of Systems   ?Review of Systems ? ?Physical Exam ?Updated Vital Signs ?BP (!) 118/92 (BP Location: Left Arm)   Pulse 87   Temp 98.4 ?F (36.9 ?C) (Oral)   Resp 18   Ht 5\' 3"  (1.6 m)   Wt 61.2 kg   SpO2 100%   BMI 23.91 kg/m?  ?Physical Exam ?Vitals and nursing note reviewed. Exam conducted with a chaperone present.  ?Constitutional:   ?   General: She is not in acute distress. ?   Appearance: Normal appearance.  ?HENT:  ?   Head: Normocephalic and atraumatic.  ?   Mouth/Throat:  ?   Mouth: Mucous membranes are moist.  ?   Pharynx: Uvula midline. Posterior oropharyngeal erythema and uvula swelling present.  ?   Tonsils: 2+ on the right. 2+ on the left.  ?   Comments: Uvula is midline, tonsillolith in the left tonsil.  Slight tonsillar edema but no purulent exudate or trismus ?Eyes:  ?    General: No scleral icterus. ?   Extraocular Movements: Extraocular movements intact.  ?   Pupils: Pupils are equal, round, and reactive to light.  ?Skin: ?   Coloration: Skin is not jaundiced.  ?Neurological:  ?   Mental Status: She is alert. Mental status is at baseline.  ?   Coordination: Coordination normal.  ? ? ?ED Results / Procedures / Treatments   ?Labs ?(all labs ordered are listed, but only abnormal results are displayed) ?Labs Reviewed  ?GROUP A STREP BY PCR  ? ? ?EKG ?None ? ?Radiology ?No results found. ? ?Procedures ?Procedures  ? ? ?Medications Ordered in ED ?Medications  ?dexamethasone (DECADRON) injection 10 mg (has no administration in time range)  ?lidocaine (XYLOCAINE) 2 % viscous mouth solution 15 mL (has no administration in time range)  ? ? ?ED Course/ Medical Decision Making/ A&P ?  ?                        ?Medical Decision Making ?Risk ?Prescription drug management. ? ? ?Patient presents due to sore throat x3 days.  Differential diagnosis includes but not limited to retropharyngeal abscess, PTA, strep, tonsil stones. ? ?I ordered viscous lidocaine and shot of Decadron to help with inflammation. ? ?Doubt  bacterial etiology. Strep ordered and pending but suspect viral etiology. Doubt PTA, RPA, or ludwig angina. Will D/C with supportive care and PCP follow up.  ? ? ? ? ? ? ? ?Final Clinical Impression(s) / ED Diagnoses ?Final diagnoses:  ?None  ? ? ?Rx / DC Orders ?ED Discharge Orders   ? ? None  ? ?  ? ? ?  ?Theron Arista, PA-C ?06/02/21 2108 ? ?  ?Arby Barrette, MD ?06/03/21 1943 ? ?

## 2021-06-02 NOTE — ED Triage Notes (Signed)
Pt arrives pov, steady gait c/o  left side sore throat and neck pain. Exposed to smoke x 3 days pta. ?

## 2021-06-03 ENCOUNTER — Telehealth: Payer: Self-pay | Admitting: Family

## 2021-06-03 NOTE — Telephone Encounter (Signed)
Pt would like referral for ENT to get tonsils removed. Went to ER last night in a lot of pain.  ?

## 2021-06-04 NOTE — Telephone Encounter (Signed)
Made pt an appt for this Thurs. for a urgent care follow up ?

## 2021-06-06 ENCOUNTER — Encounter: Payer: Self-pay | Admitting: Family

## 2021-06-06 ENCOUNTER — Ambulatory Visit (INDEPENDENT_AMBULATORY_CARE_PROVIDER_SITE_OTHER): Payer: No Typology Code available for payment source | Admitting: Family

## 2021-06-06 VITALS — BP 112/62 | HR 88 | Temp 99.7°F | Resp 16 | Ht 63.0 in | Wt 136.3 lb

## 2021-06-06 DIAGNOSIS — J3089 Other allergic rhinitis: Secondary | ICD-10-CM

## 2021-06-06 DIAGNOSIS — J358 Other chronic diseases of tonsils and adenoids: Secondary | ICD-10-CM

## 2021-06-06 DIAGNOSIS — Z20822 Contact with and (suspected) exposure to covid-19: Secondary | ICD-10-CM | POA: Diagnosis not present

## 2021-06-06 DIAGNOSIS — J029 Acute pharyngitis, unspecified: Secondary | ICD-10-CM

## 2021-06-06 DIAGNOSIS — H6690 Otitis media, unspecified, unspecified ear: Secondary | ICD-10-CM | POA: Insufficient documentation

## 2021-06-06 DIAGNOSIS — R519 Headache, unspecified: Secondary | ICD-10-CM | POA: Diagnosis not present

## 2021-06-06 DIAGNOSIS — J309 Allergic rhinitis, unspecified: Secondary | ICD-10-CM | POA: Insufficient documentation

## 2021-06-06 DIAGNOSIS — H65191 Other acute nonsuppurative otitis media, right ear: Secondary | ICD-10-CM

## 2021-06-06 LAB — POC COVID19 BINAXNOW: SARS Coronavirus 2 Ag: NEGATIVE

## 2021-06-06 MED ORDER — AMOXICILLIN-POT CLAVULANATE 875-125 MG PO TABS: 1.0000 | ORAL_TABLET | Freq: Two times a day (BID) | ORAL | 0 refills | Status: DC

## 2021-06-06 NOTE — Assessment & Plan Note (Signed)
covid tested and negative 

## 2021-06-06 NOTE — Assessment & Plan Note (Signed)
Pt requesting workup for chronic tonsil stones ?ent referral to ent for eval/treat ?

## 2021-06-06 NOTE — Assessment & Plan Note (Signed)
May be sinus related. Advised pt to start zyrtec and also flonase on daily basis.  ?If persists will eval/ further to consider lab work as well.  ?

## 2021-06-06 NOTE — Assessment & Plan Note (Signed)
Sending for throat culture  ?Already strep negative at urgent care  ?

## 2021-06-06 NOTE — Progress Notes (Signed)
? ?Established Patient Office Visit ? ?Subjective:  ?Patient ID: Alisha Edwards, female    DOB: 1990-12-05  Age: 31 y.o. MRN: [id number] ? ?CC:  ?Chief Complaint  ?Patient presents with  ? Follow-up  ?  Still feels like something in throat.  ? ? ?HPI ?Alisha Edwards is here today for follow up from urgent care ? ?Went to urgent care for sore throat x 3 days, went on 4/23. Had a hard time swallowing and hard time eating, and felt like something was in her throat. In the er was given decadron injection. Had been tested for strep, and was negative.  ? ?She does state she had a tonsil stone that she was able to take out, however since then still throat hurting and voice has not completely come back. If she speaks too loud her throat hurts a good amount.  ? ?Had had tonsil stones for years, and she is thinking that if this is what caused this issue then she would like to see an ENT.  ? ? ? ?Past Medical History:  ?Diagnosis Date  ? Anemia   ? Anxiety   ? Depression   ? Dysmenorrhea   ? Genital warts   ? HSV (herpes simplex virus) anogenital infection   ? Migraine without aura   ? MRSA (methicillin resistant staph aureus) culture positive 2009  ? thigh  ? STD (sexually transmitted disease)   ? Chlamydia  ? ? ?Past Surgical History:  ?Procedure Laterality Date  ? nasal surgery    ? bil nares with cautery  ? WISDOM TOOTH EXTRACTION    ? ? ?Family History  ?Problem Relation Age of Onset  ? Hypertension Mother   ? Thyroid disease Father   ? Mental illness Sister   ? Mental illness Brother   ? Mental illness Brother   ? Depression Brother   ? Mental illness Brother   ? Mental illness Brother   ? Bipolar disorder Brother   ? Anxiety disorder Brother   ? Depression Brother   ? Hypertension Maternal Grandmother   ? Breast cancer Maternal Grandmother   ?     Age 22's  ? Breast cancer Paternal Grandmother   ?     Age 23'S  ? Cancer Paternal Grandfather   ?     Nasal  ? Mental illness Half-Sister   ? Mood Disorder Half-Sister    ? ? ?Social History  ? ?Socioeconomic History  ? Marital status: Single  ?  Spouse name: Not on file  ? Number of children: 1  ? Years of education: Not on file  ? Highest education level: Not on file  ?Occupational History  ?  Employer: [employer]   ?Tobacco Use  ? Smoking status: Never  ? Smokeless tobacco: Never  ?Vaping Use  ? Vaping Use: Former  ?Substance and Sexual Activity  ? Alcohol use: Yes  ?  Comment: occ  ? Drug use: No  ? Sexual activity: Yes  ?  Birth control/protection: None, Injection  ?  Comment: 1ST INTERCOURSE- 74, PARTNERS- 7  ?Other Topics Concern  ? Not on file  ?Social History Narrative  ? One daughter, 17 years old   ? ?Social Determinants of Health  ? ?Financial Resource Strain: Not on file  ?Food Insecurity: Not on file  ?Transportation Needs: Not on file  ?Physical Activity: Not on file  ?Stress: Not on file  ?Social Connections: Not on file  ?Intimate Partner Violence: Not on file  ? ? ?  Outpatient Medications Prior to Visit  ?Medication Sig Dispense Refill  ? ALPRAZolam (XANAX) 0.25 MG tablet Take 0.25 mg by mouth at bedtime as needed.    ? cyclobenzaprine (FLEXERIL) 10 MG tablet Take one po qhs prn muscle spasm 30 tablet 0  ? escitalopram (LEXAPRO) 10 MG tablet Take by mouth.    ? lidocaine (XYLOCAINE) 2 % solution Use as directed 15 mLs in the mouth or throat as needed for mouth pain. 100 mL 0  ? medroxyPROGESTERone (DEPO-PROVERA) 150 MG/ML injection Inject 150 mg into the muscle every 3 (three) months.    ? methylphenidate (RITALIN) 10 MG tablet Take 10 mg by mouth 2 (two) times daily as needed.    ? valACYclovir (VALTREX) 500 MG tablet Take 1 tablet (500 mg total) by mouth daily. 90 tablet 0  ? ?No facility-administered medications prior to visit.  ? ? ?Allergies  ?Allergen Reactions  ? Shellfish Allergy Itching and Swelling  ?  Mouth swells and itches  ? Latex Rash  ? ? ?ROS ?Review of Systems  ?Constitutional:  Negative for chills, fatigue and fever.  ?HENT:  Positive for  sore throat and voice change (hoarseness). Negative for congestion, ear pain and sinus pressure.   ?Respiratory:  Positive for cough (dry cough). Negative for shortness of breath and wheezing.   ?Cardiovascular:  Negative for chest pain and palpitations.  ?Neurological:  Positive for headaches.  ? ?  ?Objective:  ?  ?Physical Exam ?Constitutional:   ?   General: She is not in acute distress. ?   Appearance: Normal appearance. She is not ill-appearing.  ?HENT:  ?   Right Ear: Tympanic membrane is erythematous and bulging.  ?   Left Ear: Tympanic membrane normal. Tympanic membrane is not erythematous, retracted or bulging.  ?   Nose: Nose normal. No congestion or rhinorrhea.  ?   Right Turbinates: Enlarged and swollen.  ?   Left Turbinates: Enlarged and swollen.  ?   Mouth/Throat:  ?   Mouth: Mucous membranes are moist.  ?   Pharynx: Posterior oropharyngeal erythema present. No pharyngeal swelling or oropharyngeal exudate.  ?   Tonsils: No tonsillar exudate.  ?Eyes:  ?   Extraocular Movements: Extraocular movements intact.  ?   Conjunctiva/sclera: Conjunctivae normal.  ?   Pupils: Pupils are equal, round, and reactive to light.  ?Neck:  ?   Thyroid: No thyroid mass.  ?Cardiovascular:  ?   Rate and Rhythm: Normal rate and regular rhythm.  ?Pulmonary:  ?   Effort: Pulmonary effort is normal.  ?   Breath sounds: Normal breath sounds.  ?Lymphadenopathy:  ?   Cervical:  ?   Right cervical: No superficial cervical adenopathy. ?   Left cervical: No superficial cervical adenopathy.  ?Neurological:  ?   Mental Status: She is alert.  ? ? ? ?BP 112/62   Pulse 88   Temp 99.7 ?F (37.6 ?C)   Resp 16   Ht 5\' 3"  (1.6 m)   Wt 136 lb 5 oz (61.8 kg)   SpO2 99%   BMI 24.15 kg/m?  ?Wt Readings from Last 3 Encounters:  ?06/06/21 136 lb 5 oz (61.8 kg)  ?06/02/21 135 lb (61.2 kg)  ?05/22/21 131 lb 9 oz (59.7 kg)  ? ? ? ?Health Maintenance Due  ?Topic Date Due  ? PAP SMEAR-Modifier  04/12/2018  ? COVID-19 Vaccine (4 - Booster for  Pfizer series) 03/12/2020  ? ? ?There are no preventive care reminders to display for this  patient. ? ?Lab Results  ?Component Value Date  ? TSH 0.98 05/22/2021  ? ?Lab Results  ?Component Value Date  ? WBC 5.8 05/22/2021  ? HGB 13.5 05/22/2021  ? HCT 40.0 05/22/2021  ? MCV 91.3 05/22/2021  ? PLT 262.0 05/22/2021  ? ?Lab Results  ?Component Value Date  ? NA 139 05/22/2021  ? K 4.4 05/22/2021  ? CO2 29 05/22/2021  ? GLUCOSE 77 05/22/2021  ? BUN 16 05/22/2021  ? CREATININE 0.97 05/22/2021  ? BILITOT 0.6 08/29/2020  ? ALKPHOS 45 08/29/2020  ? AST 15 08/29/2020  ? ALT 16 08/29/2020  ? PROT 7.6 08/29/2020  ? ALBUMIN 3.9 08/29/2020  ? CALCIUM 9.5 05/22/2021  ? ANIONGAP 10 08/29/2020  ? GFR 78.27 05/22/2021  ? ?No results found for: CHOL ?No results found for: HDL ?No results found for: LDLCALC ?No results found for: TRIG ?No results found for: CHOLHDL ?Lab Results  ?Component Value Date  ? HGBA1C 5.6 05/22/2021  ? ? ?  ?Assessment & Plan:  ? ?Problem List Items Addressed This Visit   ? ?  ? Respiratory  ? Cryptic tonsil  ?  Pt requesting workup for chronic tonsil stones ?ent referral to ent for eval/treat ? ?  ?  ? Relevant Orders  ? Ambulatory referral to ENT  ? Allergic rhinitis due to allergen  ?  ? Nervous and Auditory  ? Otitis media  ?  Right side.  ?Prescription given for augmentin 875/125 mg po bid for ten days. Pt to continue tylenol/ibuprofen prn sinus pain. Continue with humidifier prn and steam showers recommended as well. instructed If no symptom improvement in 48 hours please f/u ? ? ?  ?  ? Relevant Medications  ? amoxicillin-clavulanate (AUGMENTIN) 875-125 MG tablet  ?  ? Other  ? Suspected COVID-19 virus infection  ?  covid tested and negative ? ?  ?  ? Relevant Orders  ? POC COVID-19 BinaxNow  ? Sore throat  ?  Sending for throat culture  ?Already strep negative at urgent care  ? ?  ?  ? Relevant Orders  ? Culture, Group A Strep  ? Intractable headache - Primary  ?  May be sinus related. Advised pt to  start zyrtec and also flonase on daily basis.  ?If persists will eval/ further to consider lab work as well.  ? ?  ?  ? Relevant Medications  ? escitalopram (LEXAPRO) 10 MG tablet  ? ? ?Meds ordered this encounter  ?

## 2021-06-06 NOTE — Assessment & Plan Note (Signed)
Right side.  ?Prescription given for augmentin 875/125 mg po bid for ten days. Pt to continue tylenol/ibuprofen prn sinus pain. Continue with humidifier prn and steam showers recommended as well. instructed If no symptom improvement in 48 hours please f/u ? ?

## 2021-06-06 NOTE — Patient Instructions (Addendum)
Recommend you start daily flonase nose spray as well as zyrtec at night.  ? ?A referral was placed today for ENT ?Please let us know if you have not heard back within 2 weeks about the referral. ? ? ?Due to recent changes in healthcare laws, you may see results of your imaging and/or laboratory studies on MyChart before I have had a chance to review them.  I understand that in some cases there may be results that are confusing or concerning to you. Please understand that not all results are received at the same time and often I may need to interpret multiple results in order to provide you with the best plan of care or course of treatment. Therefore, I ask that you please give me 2 business days to thoroughly review all your results before contacting my office for clarification. Should we see a critical lab result, you will be contacted sooner.  ? ?It was a pleasure seeing you today! Please do not hesitate to reach out with any questions and or concerns. ? ?Regards,  ? ?Murphy Bundick ?FNP-C ? ? ?

## 2021-06-08 LAB — CULTURE, GROUP A STREP
MICRO NUMBER:: 13321069
SPECIMEN QUALITY:: ADEQUATE

## 2021-06-17 ENCOUNTER — Encounter: Payer: Self-pay | Admitting: *Deleted

## 2021-07-01 ENCOUNTER — Encounter: Payer: Self-pay | Admitting: Family

## 2021-07-01 ENCOUNTER — Ambulatory Visit: Payer: No Typology Code available for payment source | Admitting: Obstetrics and Gynecology

## 2021-07-01 DIAGNOSIS — Z0289 Encounter for other administrative examinations: Secondary | ICD-10-CM

## 2021-07-11 ENCOUNTER — Encounter: Payer: Self-pay | Admitting: Family

## 2021-07-11 ENCOUNTER — Ambulatory Visit (INDEPENDENT_AMBULATORY_CARE_PROVIDER_SITE_OTHER): Payer: No Typology Code available for payment source | Admitting: Family

## 2021-07-11 VITALS — BP 92/54 | HR 90 | Temp 99.5°F | Resp 16 | Ht 63.0 in | Wt 134.2 lb

## 2021-07-11 DIAGNOSIS — U071 COVID-19: Secondary | ICD-10-CM | POA: Diagnosis not present

## 2021-07-11 DIAGNOSIS — Z20822 Contact with and (suspected) exposure to covid-19: Secondary | ICD-10-CM | POA: Diagnosis not present

## 2021-07-11 MED ORDER — MOLNUPIRAVIR EUA 200MG CAPSULE: 4.0000 | ORAL_CAPSULE | Freq: Two times a day (BID) | ORAL | 0 refills | Status: AC

## 2021-07-11 NOTE — Progress Notes (Signed)
Established Patient Office Visit  Subjective:  Patient ID: Alisha Edwards, female    DOB: 1990/05/04  Age: 31 y.o. MRN: [id number]  CC:  Chief Complaint  Patient presents with   Cough    X 2 months   Headache   Sinus Problem    Test neg for covid 4 days    HPI Alisha Edwards is here today with concerns.   Pt has been sick for the last three days. With sinus pressure, headache as well as cough. Has had running nose as well as    Past Medical History:  Diagnosis Date   Anemia    Anxiety    Depression    Dysmenorrhea    Genital warts    HSV (herpes simplex virus) anogenital infection    Migraine without aura    MRSA (methicillin resistant staph aureus) culture positive 2009   thigh   STD (sexually transmitted disease)    Chlamydia    Past Surgical History:  Procedure Laterality Date   nasal surgery     bil nares with cautery   WISDOM TOOTH EXTRACTION      Family History  Problem Relation Age of Onset   Hypertension Mother    Thyroid disease Father    Mental illness Sister    Mental illness Brother    Mental illness Brother    Depression Brother    Mental illness Brother    Mental illness Brother    Bipolar disorder Brother    Anxiety disorder Brother    Depression Brother    Hypertension Maternal Grandmother    Breast cancer Maternal Grandmother        Age 57's   Breast cancer Paternal 72        Age 97'S   Cancer Paternal Grandfather        Nasal   Mental illness Half-Sister    Mood Disorder Half-Sister     Social History   Socioeconomic History   Marital status: Single    Spouse name: Not on file   Number of children: 1   Years of education: Not on file   Highest education level: Not on file  Occupational History    Employer: Manufacturing engineer   Tobacco Use   Smoking status: Never   Smokeless tobacco: Never  Vaping Use   Vaping Use: Former  Substance and Sexual Activity   Alcohol use: Yes    Comment: occ   Drug use: No    Sexual activity: Yes    Birth control/protection: None, Injection    Comment: 1ST INTERCOURSE- 70, PARTNERS- 7  Other Topics Concern   Not on file  Social History Narrative   One daughter, 67 years old    Social Determinants of Radio broadcast assistant Strain: Not on file  Food Insecurity: Not on file  Transportation Needs: Not on file  Physical Activity: Not on file  Stress: Not on file  Social Connections: Not on file  Intimate Partner Violence: Not on file    Outpatient Medications Prior to Visit  Medication Sig Dispense Refill   ALPRAZolam (XANAX) 0.25 MG tablet Take 0.25 mg by mouth at bedtime as needed.     cyclobenzaprine (FLEXERIL) 10 MG tablet Take one po qhs prn muscle spasm 30 tablet 0   escitalopram (LEXAPRO) 10 MG tablet Take by mouth.     lidocaine (XYLOCAINE) 2 % solution Use as directed 15 mLs in the mouth or throat as needed for mouth pain. 100  mL 0   medroxyPROGESTERone (DEPO-PROVERA) 150 MG/ML injection Inject 150 mg into the muscle every 3 (three) months.     methylphenidate (RITALIN) 10 MG tablet Take 10 mg by mouth 2 (two) times daily as needed.     valACYclovir (VALTREX) 500 MG tablet Take 1 tablet (500 mg total) by mouth daily. 90 tablet 0   amoxicillin-clavulanate (AUGMENTIN) 875-125 MG tablet Take 1 tablet by mouth 2 (two) times daily. (Patient not taking: Reported on 07/11/2021) 20 tablet 0   No facility-administered medications prior to visit.    Allergies  Allergen Reactions   Shellfish Allergy Itching and Swelling    Mouth swells and itches   Latex Rash       Objective:    Physical Exam Constitutional:      General: She is not in acute distress.    Appearance: Normal appearance. She is not ill-appearing.  HENT:     Right Ear: Tympanic membrane normal.     Left Ear: Tympanic membrane normal.     Nose: Nose normal. No congestion or rhinorrhea.     Right Turbinates: Enlarged. Not swollen.     Left Turbinates: Enlarged. Not swollen.      Right Sinus: No maxillary sinus tenderness or frontal sinus tenderness.     Left Sinus: No maxillary sinus tenderness or frontal sinus tenderness.     Mouth/Throat:     Mouth: Mucous membranes are moist.     Pharynx: Posterior oropharyngeal erythema present. No pharyngeal swelling or oropharyngeal exudate.     Tonsils: No tonsillar exudate.  Eyes:     Extraocular Movements: Extraocular movements intact.     Conjunctiva/sclera: Conjunctivae normal.     Pupils: Pupils are equal, round, and reactive to light.  Neck:     Thyroid: No thyroid mass.  Cardiovascular:     Rate and Rhythm: Normal rate and regular rhythm.  Pulmonary:     Effort: Pulmonary effort is normal.     Breath sounds: Normal breath sounds.  Lymphadenopathy:     Cervical:     Right cervical: No superficial cervical adenopathy.    Left cervical: No superficial cervical adenopathy.  Neurological:     Mental Status: She is alert.    BP (!) 92/54   Pulse 90   Temp 99.5 F (37.5 C)   Resp 16   Ht 5\' 3"  (1.6 m)   Wt 134 lb 4 oz (60.9 kg)   SpO2 99%   BMI 23.78 kg/m  Wt Readings from Last 3 Encounters:  07/11/21 134 lb 4 oz (60.9 kg)  06/06/21 136 lb 5 oz (61.8 kg)  06/02/21 135 lb (61.2 kg)     Health Maintenance Due  Topic Date Due   PAP SMEAR-Modifier  04/12/2018   COVID-19 Vaccine (4 - Booster for Pfizer series) 03/12/2020    There are no preventive care reminders to display for this patient.  Lab Results  Component Value Date   TSH 0.98 05/22/2021   Lab Results  Component Value Date   WBC 5.8 05/22/2021   HGB 13.5 05/22/2021   HCT 40.0 05/22/2021   MCV 91.3 05/22/2021   PLT 262.0 05/22/2021   Lab Results  Component Value Date   NA 139 05/22/2021   K 4.4 05/22/2021   CO2 29 05/22/2021   GLUCOSE 77 05/22/2021   BUN 16 05/22/2021   CREATININE 0.97 05/22/2021   BILITOT 0.6 08/29/2020   ALKPHOS 45 08/29/2020   AST 15 08/29/2020   ALT 16 08/29/2020  PROT 7.6 08/29/2020   ALBUMIN 3.9  08/29/2020   CALCIUM 9.5 05/22/2021   ANIONGAP 10 08/29/2020   GFR 78.27 05/22/2021   Lab Results  Component Value Date   HGBA1C 5.6 05/22/2021      Assessment & Plan:   Problem List Items Addressed This Visit       Other   Suspected COVID-19 virus infection    covid tested in office, negative workup        COVID-19 - Primary    Advised of CDC guidelines for self isolation/ ending isolation.  Advised of safe practice guidelines. Symptom Tier reviewed.  Encouraged to monitor for any worsening symptoms; watch for increased shortness of breath, weakness, and signs of dehydration. Advised when to seek emergency care.  Instructed to rest and hydrate well.  Advised to leave the house during recommended isolation period, only if it is necessary to seek medical care  Have expressed to pt needs to use extra birth control methods one week prior and one week after taking this medication. have decided pt is a candidate for antiviral and pt agrees that she would like to take this. I have sent in RX for molnupiravir 200 mg capsules to be taken as directed.        Relevant Medications   molnupiravir EUA (LAGEVRIO) 200 mg CAPS capsule    Meds ordered this encounter  Medications   molnupiravir EUA (LAGEVRIO) 200 mg CAPS capsule    Sig: Take 4 capsules (800 mg total) by mouth 2 (two) times daily for 5 days.    Dispense:  40 capsule    Refill:  0    Order Specific Question:   Supervising Provider    Answer:   BEDSOLE, AMY E [[id number]]    Follow-up: No follow-ups on file.    Eugenia Pancoast, FNP

## 2021-07-11 NOTE — Patient Instructions (Signed)
Your COVID test is positive. You should remain isolated and quarantine for at least 5 days from start of symptoms. You must be feeling better and be fever free without any fever reducers for at least 24 hours as well. You should wear a mask at all times when out of your home or around others for 5 days after leaving isolation.  Your household contacts should be tested as well as work contacts. If you feel worse or have increasing shortness of breath, you should be seen in person at urgent care or the emergency room.   Due to recent changes in healthcare laws, you may see results of your imaging and/or laboratory studies on MyChart before I have had a chance to review them.  I understand that in some cases there may be results that are confusing or concerning to you. Please understand that not all results are received at the same time and often I may need to interpret multiple results in order to provide you with the best plan of care or course of treatment. Therefore, I ask that you please give me 2 business days to thoroughly review all your results before contacting my office for clarification. Should we see a critical lab result, you will be contacted sooner.   It was a pleasure seeing you today! Please do not hesitate to reach out with any questions and or concerns.  Regards,   Jhoan Schmieder FNP-C   

## 2021-07-11 NOTE — Assessment & Plan Note (Signed)
covid tested in office, negative workup

## 2021-07-11 NOTE — Assessment & Plan Note (Signed)
Advised of CDC guidelines for self isolation/ ending isolation.  Advised of safe practice guidelines. Symptom Tier reviewed.  Encouraged to monitor for any worsening symptoms; watch for increased shortness of breath, weakness, and signs of dehydration. Advised when to seek emergency care.  Instructed to rest and hydrate well.  Advised to leave the house during recommended isolation period, only if it is necessary to seek medical care  Have expressed to pt needs to use extra birth control methods one week prior and one week after taking this medication. have decided pt is a candidate for antiviral and pt agrees that she would like to take this. I have sent in RX for molnupiravir 200 mg capsules to be taken as directed.

## 2021-10-15 ENCOUNTER — Ambulatory Visit: Payer: No Typology Code available for payment source

## 2021-11-05 ENCOUNTER — Ambulatory Visit: Payer: Medicaid Other | Admitting: Family Medicine

## 2021-11-06 ENCOUNTER — Ambulatory Visit (INDEPENDENT_AMBULATORY_CARE_PROVIDER_SITE_OTHER): Payer: Medicaid Other | Admitting: Nurse Practitioner

## 2021-11-06 ENCOUNTER — Ambulatory Visit (INDEPENDENT_AMBULATORY_CARE_PROVIDER_SITE_OTHER)
Admission: RE | Admit: 2021-11-06 | Discharge: 2021-11-06 | Disposition: A | Discharge status: Home / Self Care | Payer: Medicaid Other | Source: Ambulatory Visit | Attending: Nurse Practitioner | Admitting: Nurse Practitioner

## 2021-11-06 ENCOUNTER — Encounter: Payer: Self-pay | Admitting: Nurse Practitioner

## 2021-11-06 ENCOUNTER — Ambulatory Visit: Payer: Medicaid Other | Admitting: Family Medicine

## 2021-11-06 VITALS — BP 118/72 | HR 66 | Temp 97.5°F | Resp 14 | Ht 63.0 in | Wt 137.2 lb

## 2021-11-06 DIAGNOSIS — J020 Streptococcal pharyngitis: Secondary | ICD-10-CM | POA: Diagnosis not present

## 2021-11-06 DIAGNOSIS — G4489 Other headache syndrome: Secondary | ICD-10-CM | POA: Insufficient documentation

## 2021-11-06 DIAGNOSIS — R042 Hemoptysis: Secondary | ICD-10-CM | POA: Insufficient documentation

## 2021-11-06 DIAGNOSIS — J029 Acute pharyngitis, unspecified: Secondary | ICD-10-CM | POA: Diagnosis not present

## 2021-11-06 DIAGNOSIS — R52 Pain, unspecified: Secondary | ICD-10-CM | POA: Insufficient documentation

## 2021-11-06 LAB — POCT INFLUENZA A/B
Influenza A, POC: NEGATIVE
Influenza B, POC: NEGATIVE

## 2021-11-06 LAB — POC COVID19 BINAXNOW: SARS Coronavirus 2 Ag: NEGATIVE

## 2021-11-06 LAB — POCT RAPID STREP A (OFFICE): Rapid Strep A Screen: POSITIVE — AB

## 2021-11-06 MED ORDER — AMOXICILLIN 500 MG PO CAPS: 500.0000 mg | ORAL_CAPSULE | Freq: Two times a day (BID) | ORAL | 0 refills | Status: DC

## 2021-11-06 NOTE — Assessment & Plan Note (Signed)
Strep test positive in office we will treat with amoxicillin 500 mg twice daily for 10 days.

## 2021-11-06 NOTE — Assessment & Plan Note (Signed)
COVID and flu test negative in office.  Rest, hydrate, over-the-counter analgesics.

## 2021-11-06 NOTE — Assessment & Plan Note (Signed)
Obtain flu test, COVID test.

## 2021-11-06 NOTE — Assessment & Plan Note (Signed)
We will do strep test, COVID test, flu test.

## 2021-11-06 NOTE — Assessment & Plan Note (Signed)
Had 1 episode of bloody streaked sputum.  Patient does not cough but does have recurrent epistaxis.  Will obtain chest x-ray to make sure no mass in lungs likely from upper airway.  Discussed with patient

## 2021-11-06 NOTE — Progress Notes (Signed)
Acute Office Visit  Subjective:     Patient ID: Alisha Edwards, female    DOB: 30-Aug-1990, 31 y.o.   MRN: [id number]  Chief Complaint  Patient presents with   Sore Throat    Started on 11/03/21 with a bad sore throat and feeling achy, has recurrent tonsil stones and she got one out and throat improved, then her ache continued, headache, head pressure/congestion.   Medication Management    Discuss trying Adderall, not able to see psychiatrist due to her insurance     Patient is in today for sore throat  Sore Throat: Symptoms started 3 days  No sick contacts States that she has gotten tonsil stones for years. States that she has spoke to her pcp about it once. States that she was referred to ENT. States that she noticed her sore throat Sunday on Monday she noticed she was having body aches. Noticed Tuesday she removed a tonisl stonils. States that she started getting some releif in regards to her throat.  States that she did cough and had some blood in it. No more blood after that one episode. States that she started to take mucinex and has helped a little bit  Medication discussion: States that she has been on ritalin in the past. States that she use to be evaluated by psych. States that she had ritalin in the past. States she has never tried adderall in the past but heard that it does last longer.  Last [ysch visit was march this year. Last ritalin in April  Review of Systems  Constitutional:  Positive for chills and malaise/fatigue. Negative for fever.  HENT:  Positive for sinus pain and sore throat (improved). Negative for ear discharge and ear pain.   Respiratory:  Positive for cough. Negative for shortness of breath.   Musculoskeletal:  Positive for myalgias (resovling).  Neurological:  Positive for headaches.        Objective:    BP 118/72   Pulse 66   Temp (!) 97.5 F (36.4 C)   Resp 14   Ht 5\' 3"  (1.6 m)   Wt 137 lb 4 oz (62.3 kg)   SpO2 100%   BMI 24.31  kg/m    Physical Exam Vitals and nursing note reviewed.  Constitutional:      Appearance: She is well-developed.  HENT:     Right Ear: Tympanic membrane, ear canal and external ear normal.     Left Ear: Ear canal and external ear normal.     Nose:     Right Sinus: Maxillary sinus tenderness and frontal sinus tenderness present.     Left Sinus: Maxillary sinus tenderness and frontal sinus tenderness present.     Mouth/Throat:     Mouth: Mucous membranes are moist.     Pharynx: Oropharynx is clear. Posterior oropharyngeal erythema present.  Cardiovascular:     Rate and Rhythm: Normal rate and regular rhythm.     Heart sounds: Normal heart sounds.  Pulmonary:     Effort: Pulmonary effort is normal.     Breath sounds: Normal breath sounds.  Lymphadenopathy:     Cervical: No cervical adenopathy.  Neurological:     Mental Status: She is alert.     Results for orders placed or performed in visit on 11/06/21  POC COVID-19  Result Value Ref Range   SARS Coronavirus 2 Ag Negative Negative  POCT Influenza A/B  Result Value Ref Range   Influenza A, POC Negative Negative   Influenza  B, POC Negative Negative  Rapid Strep A  Result Value Ref Range   Rapid Strep A Screen Positive (A) Negative        Assessment & Plan:   Problem List Items Addressed This Visit       Respiratory   Strep throat    Strep test positive in office we will treat with amoxicillin 500 mg twice daily for 10 days.      Relevant Medications   amoxicillin (AMOXIL) 500 MG capsule     Other   Sore throat    We will do strep test, COVID test, flu test.      Relevant Orders   POC COVID-19 (Completed)   POCT Influenza A/B (Completed)   Rapid Strep A (Completed)   Hemoptysis - Primary    Had 1 episode of bloody streaked sputum.  Patient does not cough but does have recurrent epistaxis.  Will obtain chest x-ray to make sure no mass in lungs likely from upper airway.  Discussed with patient       Relevant Orders   DG Chest 2 View   Other headache syndrome    COVID and flu test negative in office.  Rest, hydrate, over-the-counter analgesics.      Relevant Orders   POC COVID-19 (Completed)   POCT Influenza A/B (Completed)   Rapid Strep A (Completed)   Body aches    Obtain flu test, COVID test.       Meds ordered this encounter  Medications   amoxicillin (AMOXIL) 500 MG capsule    Sig: Take 1 capsule (500 mg total) by mouth 2 (two) times daily for 10 days.    Dispense:  20 capsule    Refill:  0    Order Specific Question:   Supervising Provider    Answer:   TOWER, MARNE A [[id number]    Return if symptoms worsen or fail to improve.  Audria Nine, NP

## 2021-11-06 NOTE — Patient Instructions (Signed)
Strep was positive in office I will be in touch with the chest xray results I will let Tabitha know about the medication we discussed

## 2021-11-08 ENCOUNTER — Telehealth: Payer: Self-pay | Admitting: Family

## 2021-11-08 ENCOUNTER — Telehealth: Payer: Self-pay | Admitting: Nurse Practitioner

## 2021-11-08 ENCOUNTER — Other Ambulatory Visit: Payer: Self-pay | Admitting: Nurse Practitioner

## 2021-11-08 DIAGNOSIS — R0982 Postnasal drip: Secondary | ICD-10-CM

## 2021-11-08 MED ORDER — AMOXICILLIN-POT CLAVULANATE 875-125 MG PO TABS: 1.0000 | ORAL_TABLET | Freq: Two times a day (BID) | ORAL | 0 refills | Status: AC

## 2021-11-08 MED ORDER — FLUTICASONE PROPIONATE 50 MCG/ACT NA SUSP: 2.0000 | Freq: Every day | NASAL | 0 refills | Status: DC

## 2021-11-08 NOTE — Telephone Encounter (Signed)
I will send in some Flonase nasal spray to use. This can cause nosebleeds with extended use. If that occurs discontinue use

## 2021-11-08 NOTE — Telephone Encounter (Signed)
Oral second generation antihistamine like Claritin, zyrtec, allegra or xyzal

## 2021-11-08 NOTE — Telephone Encounter (Signed)
Spoke with patient, she apologized that she could not call earlier due to been at work and not able to take a break so her mom did not know patient has taking all the allergy/OTC medications to try and help her symptoms-she is having Sinus headache-not the worst pain in her life but pretty intense, post nasal drip, burning in the throat from the drainage and coughing it up, nasal congestion, Has been taking Zyrtec, Ibuprofen and it does not help in the last week. This pain feels like the sinus infection she has had in the past. She has done OTC treatments and allergy remedies. She has not felt any better since taking Amoxicillin if anything feels some worse, so much pressure/congestion in the head/sinus area.

## 2021-11-08 NOTE — Telephone Encounter (Signed)
Called and spoke with patient about the medication change. She will stop the amoxicillin and start the augmentin

## 2021-11-08 NOTE — Telephone Encounter (Signed)
Pt's mother, leta called stating the pt having some drainage in the back of throat & nose. Pt saw Cable on 11/06/21 for tonsil issues. Pt was wondering if there is some type of meds that can be prescribed for drainage? Call back # is [phone]

## 2021-11-08 NOTE — Telephone Encounter (Signed)
Leta advised-ok per DPR file, she states patient has trouble with dry nose and bleeding from things so she can not use a spray like that. She would need something oral. Advised it would most likely be like an allergy medication then and Catalina Antigua will let me know which one he suggests

## 2021-11-08 NOTE — Telephone Encounter (Signed)
Matt spoke with patient about this.

## 2021-11-08 NOTE — Telephone Encounter (Signed)
Tell her we will stop the amoxicillin and switch her to augemtin this will cover the strep and sinus infection

## 2021-12-06 ENCOUNTER — Other Ambulatory Visit: Payer: Self-pay | Admitting: Nurse Practitioner

## 2021-12-06 DIAGNOSIS — R0982 Postnasal drip: Secondary | ICD-10-CM

## 2021-12-09 ENCOUNTER — Telehealth: Payer: Self-pay | Admitting: Family

## 2021-12-09 NOTE — Telephone Encounter (Signed)
Claiborne Billings please get clarification from patient.

## 2021-12-09 NOTE — Telephone Encounter (Signed)
Patient called to get medication for a diagnosis. Call back number [phone].

## 2021-12-09 NOTE — Telephone Encounter (Signed)
Left message to return call to our office.  

## 2021-12-13 NOTE — Telephone Encounter (Signed)
Called and spoke to pt. I did scheduled her an appt to talk about medication. She stated that she was seeing a physiatrist but now they do not take her insurance, and wanted to see about getting refill. I did tell her that she would have to come in to talk to [place].

## 2021-12-16 NOTE — Telephone Encounter (Signed)
Please let pt know that if it is xanax and ritalin, I would be ok with refilling but yes, she would need an appt as we would have to fill out new paperwork as the medications are a controlled substance.

## 2021-12-18 NOTE — Telephone Encounter (Signed)
Left message to return call to our office.  

## 2021-12-18 NOTE — Telephone Encounter (Signed)
Spoke to pt and informed of what Tabitha said. Pt does have an appt on 12/20/2021

## 2021-12-20 ENCOUNTER — Encounter: Payer: Self-pay | Admitting: Family

## 2021-12-20 ENCOUNTER — Ambulatory Visit (INDEPENDENT_AMBULATORY_CARE_PROVIDER_SITE_OTHER): Payer: Medicaid Other | Admitting: Family

## 2021-12-20 VITALS — BP 120/72 | HR 81 | Temp 98.6°F | Resp 16 | Ht 63.0 in | Wt 133.2 lb

## 2021-12-20 DIAGNOSIS — F418 Other specified anxiety disorders: Secondary | ICD-10-CM

## 2021-12-20 DIAGNOSIS — F988 Other specified behavioral and emotional disorders with onset usually occurring in childhood and adolescence: Secondary | ICD-10-CM | POA: Diagnosis not present

## 2021-12-20 DIAGNOSIS — Z0283 Encounter for blood-alcohol and blood-drug test: Secondary | ICD-10-CM

## 2021-12-20 MED ORDER — METHYLPHENIDATE HCL ER 20 MG PO TBCR
20.0000 mg | EXTENDED_RELEASE_TABLET | Freq: Every day | ORAL | 0 refills | Status: DC
Start: 2021-12-20 — End: 2021-12-27

## 2021-12-20 NOTE — Assessment & Plan Note (Signed)
Refill for alprazolam 0.25 mg po qd prn anxiety Pdmp reviewed.

## 2021-12-20 NOTE — Progress Notes (Signed)
Established Patient Office Visit  Subjective:  Patient ID: Alisha Edwards, female    DOB: 01-24-1991  Age: 31 y.o. MRN: [id number]  CC:  Chief Complaint  Patient presents with   Medication Refill    HPI Alisha Edwards is here today with concerns.   Anxiety/depression/ADD: ritalin 10 mg x 2, she takes it in am and repeats again in about four hours. 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 She is going to start working at [employer] and she'll be working from home where she gets easily distracted so ritalin helps her to focus. 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   Denies cp palp and or sob.   Pdmp: last fill alprazolam march 2023.   Past Medical History:  Diagnosis Date   Anemia    Anxiety    Depression    Dysmenorrhea    Genital warts    HSV (herpes simplex virus) anogenital infection    Migraine without aura    MRSA (methicillin resistant staph aureus) culture positive 2009   thigh   STD (sexually transmitted disease)    Chlamydia    Past Surgical History:  Procedure Laterality Date   nasal surgery     bil nares with cautery   WISDOM TOOTH EXTRACTION      Family History  Problem Relation Age of Onset   Hypertension Mother    Thyroid disease Father    Mental illness Sister    Mental illness Brother    Mental illness Brother    Depression Brother    Mental illness Brother    Mental illness Brother    Bipolar disorder Brother    Anxiety disorder Brother    Depression Brother    Hypertension Maternal Grandmother    Breast cancer Maternal Grandmother        Age 57's   Breast cancer Paternal 83        Age 21'S   Cancer Paternal Grandfather        Nasal   Mental illness Half-Sister    Mood Disorder Half-Sister     Social History   Socioeconomic History   Marital status: Single    Spouse name: Not on file   Number of children: 1   Years of education: Not on file    Highest education level: Not on file  Occupational History    Employer: Manufacturing engineer   Tobacco Use   Smoking status: Never   Smokeless tobacco: Never  Vaping Use   Vaping Use: Former  Substance and Sexual Activity   Alcohol use: Yes    Comment: occ   Drug use: No   Sexual activity: Yes    Birth control/protection: None, Injection    Comment: 1ST INTERCOURSE- 89, PARTNERS- 7  Other Topics Concern   Not on file  Social History Narrative   One daughter, 28 years old    Social Determinants of Radio broadcast assistant Strain: Not on file  Food Insecurity: Not on file  Transportation Needs: Not on file  Physical Activity: Not on file  Stress: Not on file  Social Connections: Not on file  Intimate Partner Violence: Not on file    Outpatient Medications Prior to Visit  Medication Sig Dispense Refill   ALPRAZolam (XANAX) 0.25 MG tablet Take 0.25 mg by mouth at bedtime as needed.     fluticasone (FLONASE) 50 MCG/ACT  nasal spray Place 2 sprays into both nostrils daily. 16 g 0   medroxyPROGESTERone (DEPO-PROVERA) 150 MG/ML injection Inject 150 mg into the muscle every 3 (three) months.     valACYclovir (VALTREX) 500 MG tablet Take 1 tablet (500 mg total) by mouth daily. 90 tablet 0   methylphenidate (RITALIN) 10 MG tablet Take 10 mg by mouth 2 (two) times daily as needed.     No facility-administered medications prior to visit.    Allergies  Allergen Reactions   Shellfish Allergy Itching and Swelling    Mouth swells and itches   Latex Rash        Objective:    Physical Exam Constitutional:      General: She is not in acute distress.    Appearance: Normal appearance. She is not ill-appearing.  HENT:     Right Ear: Tympanic membrane normal.     Left Ear: Tympanic membrane normal.     Nose: Nose normal. No congestion or rhinorrhea.     Right Turbinates: Not enlarged or swollen.     Left Turbinates: Not enlarged or swollen.     Right Sinus: No maxillary  sinus tenderness or frontal sinus tenderness.     Left Sinus: No maxillary sinus tenderness or frontal sinus tenderness.     Mouth/Throat:     Mouth: Mucous membranes are moist.     Pharynx: No pharyngeal swelling, oropharyngeal exudate or posterior oropharyngeal erythema.     Tonsils: No tonsillar exudate.  Eyes:     Extraocular Movements: Extraocular movements intact.     Conjunctiva/sclera: Conjunctivae normal.     Pupils: Pupils are equal, round, and reactive to light.  Neck:     Thyroid: No thyroid mass.  Cardiovascular:     Rate and Rhythm: Normal rate and regular rhythm.  Pulmonary:     Effort: Pulmonary effort is normal.     Breath sounds: Normal breath sounds.  Lymphadenopathy:     Cervical:     Right cervical: No superficial cervical adenopathy.    Left cervical: No superficial cervical adenopathy.  Neurological:     General: No focal deficit present.     Mental Status: She is alert and oriented to person, place, and time. Mental status is at baseline.  Psychiatric:        Mood and Affect: Mood normal.        Behavior: Behavior normal.        Thought Content: Thought content normal.        Judgment: Judgment normal.     BP 120/72   Pulse 81   Temp 98.6 F (37 C)   Resp 16   Ht _0  (1.6 m)   Wt 133 lb 4 oz (60.4 kg)   SpO2 99%   BMI 23.60 kg/m  Wt Readings from Last 3 Encounters:  12/20/21 133 lb 4 oz (60.4 kg)  11/06/21 137 lb 4 oz (62.3 kg)  07/11/21 134 lb 4 oz (60.9 kg)     Health Maintenance Due  Topic Date Due   HPV VACCINES (2 - 3-dose series) 05/10/2015   PAP SMEAR-Modifier  04/12/2018   COVID-19 Vaccine (4 - Pfizer series) 03/12/2020       Topic Date Due   HPV VACCINES (2 - 3-dose series) 05/10/2015    Lab Results  Component Value Date   TSH 0.98 05/22/2021   Lab Results  Component Value Date   WBC 5.8 05/22/2021   HGB 13.5 05/22/2021   HCT 40.0  05/22/2021   MCV 91.3 05/22/2021   PLT 262.0 05/22/2021   Lab Results   Component Value Date   NA 139 05/22/2021   K 4.4 05/22/2021   CO2 29 05/22/2021   GLUCOSE 77 05/22/2021   BUN 16 05/22/2021   CREATININE 0.97 05/22/2021   BILITOT 0.6 08/29/2020   ALKPHOS 45 08/29/2020   AST 15 08/29/2020   ALT 16 08/29/2020   PROT 7.6 08/29/2020   ALBUMIN 3.9 08/29/2020   CALCIUM 9.5 05/22/2021   ANIONGAP 10 08/29/2020   GFR 78.27 05/22/2021   Lab Results  Component Value Date   HGBA1C 5.6 05/22/2021      Assessment & Plan:   Problem List Items Addressed This Visit       Other   Anxiety with depression    Refill for alprazolam 0.25 mg po qd prn anxiety Pdmp reviewed.       Attention deficit disorder (ADD) without hyperactivity    Change to ritalin XR to see if improvement with longevity of attention control.  Pdmp reviewed, non opioid contract signed. UDS today pending results.  Rx for ritalin SR 20 mg sent to pharmacy.       Relevant Medications   methylphenidate (METADATE ER) 20 MG ER tablet   Other Visit Diagnoses     Encounter for drug screening    -  Primary   Relevant Orders   Drug Monitoring Panel [phone] , Urine       Meds ordered this encounter  Medications   methylphenidate (METADATE ER) 20 MG ER tablet    Sig: Take 1 tablet (20 mg total) by mouth daily.    Dispense:  30 tablet    Refill:  0    Order Specific Question:   Supervising Provider    Answer:   BEDSOLE, AMY E [[id number]]    Follow-up: No follow-ups on file.    Eugenia Pancoast, FNP

## 2021-12-20 NOTE — Assessment & Plan Note (Signed)
Change to ritalin XR to see if improvement with longevity of attention control.  Pdmp reviewed, non opioid contract signed. UDS today pending results.  Rx for ritalin SR 20 mg sent to pharmacy.

## 2021-12-20 NOTE — Patient Instructions (Signed)
  I think this is a great option and appreciate you reaching out. From my experience in the past as far as referrals I recommend you call and reach out to the following two places to see if they have any Medicaid openings for therapy.   If they do please let me know and I will place the referral to the appropriate place.  If neither of these are an option I do recommend that you call your insurance and see available options and we can also place that referral appropriately with whatever you may find.  [facility] Phone # [phone] They have locations in [place], [place], [place], and [place]. They do take Healthy blue I know for sure and the [employer].   [address] [phone]  [address] [place], [place], [place] [address]    [facility] [employer] [address] [place], [place] [address]   [phone]   If at any time you feel your needs are more urgent or you are concerned for your well being please see the below options:  For walk in options for mental health:   [facility] [address] in [place], [place]. Call our 24-hour HelpLine at [phone] or 9046788692 for immediate assistance for mental health and substance abuse issues.  And or walk into [facility] hospital ER.   [employer]: 1-800-SUICIDE  The [employer] Suicide Prevention Lifeline: 1-800-273-TALK  Regards,   Mort Sawyers FNP-C

## 2021-12-22 LAB — DRUG MONITORING PANEL 376104, URINE
Amphetamines: NEGATIVE ng/mL (ref <500)
Barbiturates: NEGATIVE ng/mL (ref <300)
Benzodiazepines: NEGATIVE ng/mL (ref <100)
Cocaine Metabolite: NEGATIVE ng/mL (ref <150)
Desmethyltramadol: NEGATIVE ng/mL (ref <100)
Opiates: NEGATIVE ng/mL (ref <100)
Oxycodone: NEGATIVE ng/mL (ref <100)
Tramadol: NEGATIVE ng/mL (ref <100)

## 2021-12-22 LAB — DM TEMPLATE

## 2021-12-24 ENCOUNTER — Telehealth: Payer: Self-pay | Admitting: Family

## 2021-12-24 ENCOUNTER — Other Ambulatory Visit (HOSPITAL_COMMUNITY): Payer: Self-pay

## 2021-12-24 ENCOUNTER — Telehealth: Payer: Self-pay

## 2021-12-24 NOTE — Telephone Encounter (Signed)
Barnie Alderman from [facility] called in and stated she had some questions regarding a prior auth for methylphenidate (METADATE ER) 20 MG ER tablet. She also stated that she will fax over a form. Thank you!

## 2021-12-24 NOTE — Telephone Encounter (Signed)
Patient Advocate Encounter   Received notification from [facility] Medicaid that prior authorization for Methylphenidate 20MG  ER is required.   PA submitted on 12/24/2021 Key N/A Status is pending       12/26/2021, [facility] Patient Advocate Specialist [facility] Patient Advocate Team Direct Number: [phone] Fax: [phone]

## 2021-12-24 NOTE — Telephone Encounter (Signed)
Filled out the PA form and faxed back. PA is pending 

## 2021-12-24 NOTE — Telephone Encounter (Signed)
Pharmacy Patient Advocate Encounter  Received notification from [facility] that the request for prior authorization for Methylphenidate 20MG  ER has been denied due to .

## 2021-12-24 NOTE — Telephone Encounter (Signed)
Filled out the PA form and faxed back. PA is pending

## 2021-12-25 NOTE — Telephone Encounter (Signed)
You are so correct! My apologies, I will get it routed properly right away!

## 2021-12-26 ENCOUNTER — Telehealth: Payer: Self-pay | Admitting: Family

## 2021-12-26 NOTE — Telephone Encounter (Signed)
Patient called in to follow up on this. Informed her the PA was pending.

## 2021-12-26 NOTE — Telephone Encounter (Signed)
Patient called and stated medications where switched for her and she wanted to know what medication she will be prescribed, call back number [phone].

## 2021-12-26 NOTE — Telephone Encounter (Signed)
Sher from [facility], the patient health insurance is sending a fax for prior authorzation methylphenidate (METADATE ER) 20 MG ER tablet , and also other medications she can try which need no prior authorizations are Adderall tablets quantity limit of 2 pills  per day, Adderall XR that one is 1 per day, Focalin XR capsules that is 1 per day, Vyvanse capsules is 1 tablet per day and the chwwables is no quantity. Call back number [phone].

## 2021-12-27 ENCOUNTER — Encounter: Payer: Self-pay | Admitting: Family

## 2021-12-27 DIAGNOSIS — F988 Other specified behavioral and emotional disorders with onset usually occurring in childhood and adolescence: Secondary | ICD-10-CM

## 2021-12-27 MED ORDER — AMPHETAMINE-DEXTROAMPHET ER 20 MG PO CP24: 20.0000 mg | ORAL_CAPSULE | ORAL | 0 refills | Status: DC

## 2021-12-31 NOTE — Telephone Encounter (Signed)
PA was denied. Denial reason attached to pt chart.

## 2022-01-08 NOTE — Addendum Note (Signed)
Addended by: Mort Sawyers on: 01/08/2022 10:18 AM   Modules accepted: Orders

## 2022-01-10 ENCOUNTER — Telehealth: Payer: Self-pay

## 2022-01-10 ENCOUNTER — Other Ambulatory Visit (HOSPITAL_COMMUNITY): Payer: Self-pay

## 2022-01-10 MED ORDER — METHYLPHENIDATE HCL ER (LA) 20 MG PO CP24: 20.0000 mg | ORAL_CAPSULE | ORAL | 0 refills | Status: DC

## 2022-01-10 NOTE — Addendum Note (Signed)
Addended by: Mort Sawyers on: 01/10/2022 07:16 AM   Modules accepted: Orders

## 2022-01-10 NOTE — Telephone Encounter (Signed)
Noted  

## 2022-01-10 NOTE — Telephone Encounter (Signed)
Pharmacy Patient Advocate Encounter   Received notification from [employer] [facility] [place] that prior authorization for Methylphenidate HCl ER (LA) 20MG  er capsules is required/requested.  PA submitted on 01/10/22 to [facility] Medicaid via CoverMyMeds Key [id number]  Status is pending

## 2022-01-10 NOTE — Telephone Encounter (Signed)
Patient Advocate Encounter  Prior Authorization for Methylphenidate HCl ER (LA) 20MG  er capsules has been approved.    PA# Key: [id number]  Effective dates: 01/10/2022 through Until further Notice

## 2022-01-15 NOTE — Telephone Encounter (Signed)
Larey Seat,   Could we possibly look into this? I am trying to prescribe pt Methylphenidate long acting she was on short acting before. Initially insurance said they wouldn't cover ER without trial adderall. We tried adderall and it made her feel really weird so we stopped. Sent in methylphenidate again [place], still not looking like covered and or might need PA? Can we look into this?

## 2022-01-20 NOTE — Telephone Encounter (Signed)
Spoke with [facility], the PA is approved but the medication is on back order. They have ordered it but are not sure when it come in, they recommend that the patient calls [facility] later in the week to check the status. They would not say which stimulants they did have in stock for [place] to potentially switch to.

## 2022-01-20 NOTE — Telephone Encounter (Signed)
Called and informed the pt and she said that she would call and keep check on it.

## 2022-02-12 MED ORDER — LISDEXAMFETAMINE DIMESYLATE 30 MG PO CAPS: ORAL_CAPSULE | ORAL | 0 refills | Status: DC

## 2022-02-12 NOTE — Addendum Note (Signed)
Addended by: Eugenia Pancoast on: 02/12/2022 10:22 AM   Modules accepted: Orders

## 2022-02-24 ENCOUNTER — Ambulatory Visit (INDEPENDENT_AMBULATORY_CARE_PROVIDER_SITE_OTHER): Payer: Medicaid Other | Admitting: Family

## 2022-02-24 ENCOUNTER — Encounter: Payer: Self-pay | Admitting: Family

## 2022-02-24 VITALS — BP 100/56 | HR 99 | Temp 99.9°F | Ht 63.0 in | Wt 132.0 lb

## 2022-02-24 DIAGNOSIS — N6313 Unspecified lump in the right breast, lower outer quadrant: Secondary | ICD-10-CM | POA: Diagnosis not present

## 2022-02-24 DIAGNOSIS — F988 Other specified behavioral and emotional disorders with onset usually occurring in childhood and adolescence: Secondary | ICD-10-CM | POA: Diagnosis not present

## 2022-02-24 DIAGNOSIS — N6324 Unspecified lump in the left breast, lower inner quadrant: Secondary | ICD-10-CM | POA: Diagnosis not present

## 2022-02-24 DIAGNOSIS — N6321 Unspecified lump in the left breast, upper outer quadrant: Secondary | ICD-10-CM | POA: Insufficient documentation

## 2022-02-24 DIAGNOSIS — F411 Generalized anxiety disorder: Secondary | ICD-10-CM

## 2022-02-24 DIAGNOSIS — N926 Irregular menstruation, unspecified: Secondary | ICD-10-CM

## 2022-02-24 DIAGNOSIS — N644 Mastodynia: Secondary | ICD-10-CM | POA: Diagnosis not present

## 2022-02-24 LAB — HCG, QUANTITATIVE, PREGNANCY: Quantitative HCG: <0.6 m[IU]/mL

## 2022-02-24 LAB — FOLLICLE STIMULATING HORMONE: FSH: 6.8 m[IU]/mL

## 2022-02-24 LAB — TSH: TSH: 1 u[IU]/mL (ref 0.35–5.50)

## 2022-02-24 MED ORDER — LISDEXAMFETAMINE DIMESYLATE 20 MG PO CAPS: 20.0000 mg | ORAL_CAPSULE | Freq: Every day | ORAL | 0 refills | Status: DC

## 2022-02-24 MED ORDER — HYDROXYZINE PAMOATE 25 MG PO CAPS: 25.0000 mg | ORAL_CAPSULE | Freq: Three times a day (TID) | ORAL | 0 refills | Status: DC | PRN

## 2022-02-24 NOTE — Progress Notes (Signed)
Established Patient Office Visit  Subjective:  HPI: Alisha Edwards is a 32 y.o. female presenting on 02/24/2022 for Breast Pain (Symptoms started at the beginning of December. Nipples were a pinkish/reddish color. Both breasts are tender, but left is worse. )   HPI 32 y/o female here with acute concerns.  Bil nipples with tenderness and redness. Then increased with pain and bil breast started to have throbbing pain and hard for her to lay down. Does not have implants.   No nipple discharge. Hard for her to get comfortable to lay down. Has not taken a pregnancy test. Has not had sex since 03/2021. Periods are light, is on dep provera. Last period was 11/23, spots here and there. Left greater than right with pain.   Does have h/o breast cancer, mgm and pgm with breast cancer history as well as cousin , second cousin on mom's side.   ADD: doing well with vyvanse, likes med however at night with a lot of energy and hard to sleep. Denies cp palp and or sob.   ROS: Negative unless specifically indicated above in HPI.   Relevant past medical history reviewed and updated as indicated.   Allergies and medications reviewed and updated.   Current Outpatient Medications:    hydrOXYzine (VISTARIL) 25 MG capsule, Take 1 capsule (25 mg total) by mouth every 8 (eight) hours as needed., Disp: 30 capsule, Rfl: 0   lisdexamfetamine (VYVANSE) 20 MG capsule, Take 1 capsule (20 mg total) by mouth daily., Disp: 30 capsule, Rfl: 0   medroxyPROGESTERone (DEPO-PROVERA) 150 MG/ML injection, Inject 150 mg into the muscle every 3 (three) months., Disp: , Rfl:    valACYclovir (VALTREX) 500 MG tablet, Take 1 tablet (500 mg total) by mouth daily., Disp: 90 tablet, Rfl: 0   ALPRAZolam (XANAX) 0.25 MG tablet, Take 0.25 mg by mouth at bedtime as needed. (Patient not taking: Reported on 02/24/2022), Disp: , Rfl:    fluticasone (FLONASE) 50 MCG/ACT nasal spray, Place 2 sprays into both nostrils daily. (Patient not  taking: Reported on 02/24/2022), Disp: 16 g, Rfl: 0  Allergies  Allergen Reactions   Shellfish Allergy Itching and Swelling    Mouth swells and itches   Latex Rash    Objective:   BP (!) 100/56   Pulse 99   Temp 99.9 F (37.7 C) (Temporal)   Ht 5\' 3"  (1.6 m)   Wt 132 lb (59.9 kg)   LMP 12/15/2021 (Approximate)   SpO2 98%   BMI 23.38 kg/m    Physical Exam Constitutional:      General: She is not in acute distress.    Appearance: Normal appearance. She is normal weight. She is not ill-appearing, toxic-appearing or diaphoretic.  Cardiovascular:     Rate and Rhythm: Normal rate and regular rhythm.     Pulses: Normal pulses.  Pulmonary:     Effort: Pulmonary effort is normal.  Chest:     Chest wall: Mass (bil breast as indicated on picture) and tenderness (bil breast densities) present.  Breasts:    Right: Mass and tenderness present. No swelling, bleeding, inverted nipple, nipple discharge or skin change.     Left: Mass and tenderness present. No swelling, bleeding, inverted nipple, nipple discharge or skin change.    Lymphadenopathy:     Upper Body:     Right upper body: No supraclavicular or axillary adenopathy.     Left upper body: No supraclavicular or axillary adenopathy.  Skin:    General: Skin is  warm.  Neurological:     Mental Status: She is alert.     Assessment & Plan:  Mass of upper outer quadrant of left breast -     MM DIAG BREAST W/IMPLANT TOMO BILATERAL; Future -     US BREAST COMPLETE UNI RIGHT INC AXILLA; Future -     US BREAST COMPLETE UNI LEFT INC AXILLA; Future -     hCG, quantitative, pregnancy -     Follicle stimulating hormone -     TSH -     Prolactin -     Testos,Total,Free and SHBG (Female)  Unspecified lump in the right breast, lower outer quadrant -     MM DIAG BREAST W/IMPLANT TOMO BILATERAL; Future -     US BREAST COMPLETE UNI RIGHT INC AXILLA; Future -     US BREAST COMPLETE UNI LEFT INC AXILLA; Future -     hCG, quantitative,  pregnancy -     Follicle stimulating hormone -     TSH -     Prolactin -     Testos,Total,Free and SHBG (Female)  Mass of lower inner quadrant of left breast -     MM DIAG BREAST W/IMPLANT TOMO BILATERAL; Future -     US BREAST COMPLETE UNI RIGHT INC AXILLA; Future -     US BREAST COMPLETE UNI LEFT INC AXILLA; Future -     hCG, quantitative, pregnancy -     Follicle stimulating hormone -     TSH -     Prolactin -     Testos,Total,Free and SHBG (Female)  Attention deficit disorder (ADD) without hyperactivity Assessment & Plan: Due to increased anxiety and inability to sleep going to decrease Vyvanse 20 mg once daily.  PDMP reviewed I am aware that there was a recent fill of 30 mg however we are decreasing the dose.  Orders: -     Lisdexamfetamine Dimesylate; Take 1 capsule (20 mg total) by mouth daily.  Dispense: 30 capsule; Refill: 0  Anxiety state -     hydrOXYzine Pamoate; Take 1 capsule (25 mg total) by mouth every 8 (eight) hours as needed.  Dispense: 30 capsule; Refill: 0 -     TSH  Breast tenderness Assessment & Plan: Suspected fibrocystic densities bilateral breast did advise patient to decrease caffeine intake. Ordering bilateral mammogram diagnostic as well as bilateral breast ultrasound as there were masses palpated that were tender as well.  These could be cysts, Fatty cysts, fibrocystic densities, and/or worst-case neoplasm.  Rule out with imaging  Orders: -     hCG, quantitative, pregnancy -     Follicle stimulating hormone -     TSH -     Prolactin -     Testos,Total,Free and SHBG (Female)  Irregular periods -     hCG, quantitative, pregnancy -     Follicle stimulating hormone -     TSH -     Prolactin -     Testos,Total,Free and SHBG (Female)     Follow up plan: Return in about 3 months (around 05/26/2022) for f/u add medication .  Eugenia Pancoast, FNP

## 2022-02-24 NOTE — Patient Instructions (Signed)
  I have sent an electronic order over to your preferred location for the following:     Bilateral diagnostic breast mammogram and bil breast ultrasound   Please give this center a call to get scheduled at your convenience.   [x]   [facility]      [place], [place]         Make sure to wear two piece  clothing  No lotions powders or deodorants the day of the appointment Make sure to bring picture ID and insurance card.  Bring list of medications you are currently taking including any supplements.

## 2022-02-24 NOTE — Assessment & Plan Note (Signed)
Due to increased anxiety and inability to sleep going to decrease Vyvanse 20 mg once daily.  PDMP reviewed I am aware that there was a recent fill of 30 mg however we are decreasing the dose.

## 2022-02-24 NOTE — Assessment & Plan Note (Signed)
Suspected fibrocystic densities bilateral breast did advise patient to decrease caffeine intake. Ordering bilateral mammogram diagnostic as well as bilateral breast ultrasound as there were masses palpated that were tender as well.  These could be cysts, Fatty cysts, fibrocystic densities, and/or worst-case neoplasm.  Rule out with imaging

## 2022-02-28 LAB — TESTOS,TOTAL,FREE AND SHBG (FEMALE)
Free Testosterone: 3.1 pg/mL (ref 0.1–6.4)
Sex Hormone Binding: 42.6 nmol/L (ref 17–124)
Testosterone, Total, LC-MS-MS: 28 ng/dL (ref 2–45)

## 2022-02-28 LAB — PROLACTIN: Prolactin: 6.9 ng/mL

## 2022-04-17 ENCOUNTER — Ambulatory Visit
Admission: RE | Admit: 2022-04-17 | Discharge: 2022-04-17 | Disposition: A | Discharge status: Home / Self Care | Payer: Medicaid Other | Source: Ambulatory Visit | Attending: Family | Admitting: Family

## 2022-04-17 ENCOUNTER — Other Ambulatory Visit: Payer: Self-pay | Admitting: Family

## 2022-04-17 DIAGNOSIS — N6313 Unspecified lump in the right breast, lower outer quadrant: Secondary | ICD-10-CM

## 2022-04-17 DIAGNOSIS — N6324 Unspecified lump in the left breast, lower inner quadrant: Secondary | ICD-10-CM

## 2022-04-17 DIAGNOSIS — N644 Mastodynia: Secondary | ICD-10-CM

## 2022-04-17 DIAGNOSIS — F988 Other specified behavioral and emotional disorders with onset usually occurring in childhood and adolescence: Secondary | ICD-10-CM

## 2022-04-17 DIAGNOSIS — N926 Irregular menstruation, unspecified: Secondary | ICD-10-CM

## 2022-04-17 DIAGNOSIS — N6321 Unspecified lump in the left breast, upper outer quadrant: Secondary | ICD-10-CM

## 2022-04-17 DIAGNOSIS — F411 Generalized anxiety disorder: Secondary | ICD-10-CM

## 2022-04-28 ENCOUNTER — Other Ambulatory Visit: Payer: Self-pay | Admitting: Family

## 2022-04-28 DIAGNOSIS — F988 Other specified behavioral and emotional disorders with onset usually occurring in childhood and adolescence: Secondary | ICD-10-CM

## 2022-04-28 DIAGNOSIS — A6009 Herpesviral infection of other urogenital tract: Secondary | ICD-10-CM

## 2022-05-08 MED ORDER — LISDEXAMFETAMINE DIMESYLATE 20 MG PO CAPS: 20.0000 mg | ORAL_CAPSULE | Freq: Every day | ORAL | 0 refills | Status: DC

## 2022-05-08 MED ORDER — VALACYCLOVIR HCL 500 MG PO TABS: 500.0000 mg | ORAL_TABLET | Freq: Every day | ORAL | 0 refills | Status: DC

## 2022-06-16 ENCOUNTER — Encounter (HOSPITAL_COMMUNITY): Payer: Self-pay

## 2022-07-27 ENCOUNTER — Encounter (HOSPITAL_BASED_OUTPATIENT_CLINIC_OR_DEPARTMENT_OTHER): Payer: Self-pay | Admitting: Emergency Medicine

## 2022-07-27 ENCOUNTER — Other Ambulatory Visit: Payer: Self-pay

## 2022-07-27 ENCOUNTER — Emergency Department (HOSPITAL_BASED_OUTPATIENT_CLINIC_OR_DEPARTMENT_OTHER)
Admission: EM | Admit: 2022-07-27 | Discharge: 2022-07-27 | Disposition: A | Discharge status: Home / Self Care | Payer: 59 | Attending: Emergency Medicine | Admitting: Emergency Medicine

## 2022-07-27 DIAGNOSIS — J069 Acute upper respiratory infection, unspecified: Secondary | ICD-10-CM

## 2022-07-27 DIAGNOSIS — R059 Cough, unspecified: Secondary | ICD-10-CM | POA: Diagnosis present

## 2022-07-27 DIAGNOSIS — Z20822 Contact with and (suspected) exposure to covid-19: Secondary | ICD-10-CM | POA: Insufficient documentation

## 2022-07-27 LAB — RESP PANEL BY RT-PCR (RSV, FLU A&B, COVID)  RVPGX2
Influenza A by PCR: NEGATIVE
Influenza B by PCR: NEGATIVE
Resp Syncytial Virus by PCR: NEGATIVE
SARS Coronavirus 2 by RT PCR: NEGATIVE

## 2022-07-27 LAB — GROUP A STREP BY PCR: Group A Strep by PCR: NOT DETECTED

## 2022-07-27 MED ORDER — PREDNISONE 20 MG PO TABS: 40.0000 mg | ORAL_TABLET | Freq: Every day | ORAL | 0 refills | Status: AC

## 2022-07-27 MED ORDER — BENZONATATE 100 MG PO CAPS: 100.0000 mg | ORAL_CAPSULE | Freq: Three times a day (TID) | ORAL | 0 refills | Status: DC | PRN

## 2022-07-27 MED ORDER — ALBUTEROL SULFATE HFA 108 (90 BASE) MCG/ACT IN AERS: 1.0000 | INHALATION_SPRAY | Freq: Four times a day (QID) | RESPIRATORY_TRACT | 0 refills | Status: DC | PRN

## 2022-07-27 NOTE — ED Provider Notes (Signed)
Emergency Department Provider Note   I have reviewed the triage vital signs and the nursing notes.   HISTORY  Chief Complaint Cough   HPI Alisha Edwards is a 32 y.o. female past history reviewed below presents emergency department with cough, sore throat, congestion.  Symptoms have been ongoing for the past 3 days.  She actually started some 10 mg prednisone and promethazine cough syrup which she had leftover from a prior evaluation, but symptoms have not significantly improved.  No high fevers.  She does not smoke.  Denies history of asthma.  Notes that a friend of hers had similar cough and thinks it may be sharing some sort of infection. No CP.    Past Medical History:  Diagnosis Date   Anemia    Anxiety    Depression    Dysmenorrhea    Genital warts    HSV (herpes simplex virus) anogenital infection    Migraine without aura    MRSA (methicillin resistant staph aureus) culture positive 2009   thigh   STD (sexually transmitted disease)    Chlamydia    Review of Systems  Constitutional: No fever/chills ENT: Positive sore throat. Cardiovascular: Denies chest pain. Respiratory: Denies shortness of breath. Positive cough.  Gastrointestinal: No abdominal pain.   Musculoskeletal: Negative for back pain. Skin: Negative for rash. Neurological: Negative for headaches, focal weakness or numbness.   ____________________________________________   PHYSICAL EXAM:  VITAL SIGNS: ED Triage Vitals  Enc Vitals Group     BP 07/27/22 1114 (!) 113/97     Pulse Rate 07/27/22 1114 78     Resp 07/27/22 1114 18     Temp 07/27/22 1114 99.3 F (37.4 C)     Temp Source 07/27/22 1114 Oral     SpO2 07/27/22 1114 100 %     Weight 07/27/22 1114 130 lb (59 kg)     Height 07/27/22 1114 5\' 3"  (1.6 m)   Constitutional: Alert and oriented. Well appearing and in no acute distress. Eyes: Conjunctivae are normal.  Head: Atraumatic. Nose: No congestion/rhinnorhea. Mouth/Throat: Mucous  membranes are moist.  Oropharynx non-erythematous. No PTA. No trismus. Clear voice. Managing oral secretions.  Neck: No stridor.  Cardiovascular: Normal rate, regular rhythm. Good peripheral circulation. Grossly normal heart sounds.   Respiratory: Normal respiratory effort.  No retractions. Lungs CTAB. Gastrointestinal: No distention.  Musculoskeletal: No gross deformities of extremities. Neurologic:  Normal speech and language. Skin:  Skin is warm, dry and intact. No rash noted.   ____________________________________________   LABS (all labs ordered are listed, but only abnormal results are displayed)  Labs Reviewed  RESP PANEL BY RT-PCR (RSV, FLU A&B, COVID)  [id number]  GROUP A STREP BY PCR   ____________________________________________   PROCEDURES  Procedure(s) performed:   Procedures  None  ____________________________________________   INITIAL IMPRESSION / ASSESSMENT AND PLAN / ED COURSE  Pertinent labs & imaging results that were available during my care of the patient were reviewed by me and considered in my medical decision making (see chart for details).   This patient is Presenting for Evaluation of cough, which does require a range of treatment options, and is a complaint that involves a moderate risk of morbidity and mortality.  The Differential Diagnoses include viral URI, strep pharyngitis, COVID, Flu, RSV, CAP, etc.  Clinical Laboratory Tests Ordered, included viral panel, which is negative. Strep PCR negative.   Radiologic Tests: Considered CXR but lungs are CTABL. No hypoxemia or increased WOB. Defer imaging for now.  Social Determinants of Health Risk patient is a non-smoker.   Medical Decision Making: Summary:  Presents emergency department with cough, congestion, sore throat.  Seems most consistent with viral URI.  Has been on 2 days of 10 mg prednisone and promethazine without relief.  Reevaluation with update and discussion with patient. Viral  panel and strep negative. Plan for continue supportive care at home and close PCP follow up.   Patient's presentation is most consistent with acute, uncomplicated illness.   Disposition: discharge  ____________________________________________  FINAL CLINICAL IMPRESSION(S) / ED DIAGNOSES  Final diagnoses:  Viral URI with cough     NEW OUTPATIENT MEDICATIONS STARTED DURING THIS VISIT:  Discharge Medication List as of 07/27/2022 12:25 PM     START taking these medications   Details  albuterol (VENTOLIN HFA) 108 (90 Base) MCG/ACT inhaler Inhale 1-2 puffs into the lungs every 6 (six) hours as needed for wheezing or shortness of breath., Starting Sun 07/27/2022, Normal    benzonatate (TESSALON) 100 MG capsule Take 1 capsule (100 mg total) by mouth 3 (three) times daily as needed for cough., Starting Sun 07/27/2022, Normal    predniSONE (DELTASONE) 20 MG tablet Take 2 tablets (40 mg total) by mouth daily for 4 days., Starting Sun 07/27/2022, Until Thu 07/31/2022, Normal        Note:  This document was prepared using Dragon voice recognition software and may include unintentional dictation errors.  Alona Bene, MD, [facility] Emergency Medicine    Baylin Cabal, Arlyss Repress, MD 07/27/22 [phone]

## 2022-07-27 NOTE — ED Triage Notes (Addendum)
Pt w/ cough since Mon, sts it is now productive; reports fatigue and facial pain, sore throat

## 2022-07-27 NOTE — Discharge Instructions (Signed)
You were seen in the emerged department today with cough and nasal congestion.  I suspect you have a virus.  Your strep test, COVID, flu, RSV all came back negative.  I am continuing your steroid for the next 4 days along with adding an inhaler and cough pills.  May also try Mucinex DM over-the-counter to help with congestion.  Alternate Tylenol and/or ibuprofen as needed for pain or any developing fever.  If you develop chest pain, shortness of breath, other sudden/severe symptoms you should return to the emergency department for reevaluation.

## 2022-07-28 ENCOUNTER — Telehealth: Payer: Self-pay

## 2022-07-28 NOTE — Transitions of Care (Post Inpatient/ED Visit) (Signed)
   07/28/2022  Name: Alisha Edwards MRN: [id number] DOB: 1990-06-28  Today's TOC FU Call Status: Today's TOC FU Call Status:: Unsuccessul Call (1st Attempt) Unsuccessful Call (1st Attempt) Date: 07/28/22  Attempted to reach the patient regarding the most recent Inpatient/ED visit.  Follow Up Plan: Additional outreach attempts will be made to reach the patient to complete the Transitions of Care (Post Inpatient/ED visit) call.   Signature Elisha Ponder LPN [facility] AWV Team Direct dial:  [phone]

## 2022-07-31 NOTE — Transitions of Care (Post Inpatient/ED Visit) (Signed)
Unable to reach pt by phone and left v/m requesting pt to cb [phone].       07/31/2022  Name: Alisha Edwards MRN: [id number] DOB: 02-16-1990  Today's TOC FU Call Status: Today's TOC FU Call Status:: Unsuccessful Call (2nd Attempt) Unsuccessful Call (1st Attempt) Date: 07/28/22 Unsuccessful Call (2nd Attempt) Date: 07/31/22  Attempted to reach the patient regarding the most recent Inpatient/ED visit.  Follow Up Plan: Additional outreach attempts will be made to reach the patient to complete the Transitions of Care (Post Inpatient/ED visit) call.   Signature  Lewanda Rife, LPN

## 2022-08-01 ENCOUNTER — Telehealth: Payer: Self-pay

## 2022-08-01 NOTE — Transitions of Care (Post Inpatient/ED Visit) (Signed)
Unable to reach pt by phone; left v/m requesting pt to call [phone]. Pt was seen at [facility] ED on 07/27/22 with dx URI.attempted to reach pt x 3 with v/m left requesting pt to cb.at ED pt was advised albuterol inhaler prn; benzonatate 100 mg tid prn and prednisone 40 mg daily Sending note to T Dugal FNP.     08/01/2022  Name: Alisha Edwards MRN: [id number] DOB: 03-26-1990  Today's TOC FU Call Status: Today's TOC FU Call Status:: Unsuccessful Call (3rd Attempt) Unsuccessful Call (1st Attempt) Date: 07/28/22 Unsuccessful Call (2nd Attempt) Date: 07/31/22 Unsuccessful Call (3rd Attempt) Date: 08/01/22  Attempted to reach the patient regarding the most recent Inpatient/ED visit.  Follow Up Plan: No further outreach attempts will be made at this time. We have been unable to contact the patient.  Signature Lewanda Rife, LPN

## 2022-08-01 NOTE — Telephone Encounter (Signed)
Noted  

## 2022-08-01 NOTE — Telephone Encounter (Signed)
Error please see 07/28/22 TOC ED FU.

## 2022-09-08 ENCOUNTER — Ambulatory Visit (INDEPENDENT_AMBULATORY_CARE_PROVIDER_SITE_OTHER): Payer: 59 | Admitting: Family Medicine

## 2022-09-08 ENCOUNTER — Encounter: Payer: Self-pay | Admitting: Family Medicine

## 2022-09-08 VITALS — BP 114/66 | HR 95 | Temp 97.3°F | Ht 63.0 in | Wt 128.5 lb

## 2022-09-08 DIAGNOSIS — J020 Streptococcal pharyngitis: Secondary | ICD-10-CM | POA: Diagnosis not present

## 2022-09-08 DIAGNOSIS — J029 Acute pharyngitis, unspecified: Secondary | ICD-10-CM

## 2022-09-08 DIAGNOSIS — U071 COVID-19: Secondary | ICD-10-CM | POA: Diagnosis not present

## 2022-09-08 DIAGNOSIS — R051 Acute cough: Secondary | ICD-10-CM

## 2022-09-08 LAB — POC COVID19 BINAXNOW: SARS Coronavirus 2 Ag: POSITIVE — AB

## 2022-09-08 LAB — POCT RAPID STREP A (OFFICE): Rapid Strep A Screen: POSITIVE — AB

## 2022-09-08 MED ORDER — CHERATUSSIN AC 100-10 MG/5ML PO SOLN: 5.0000 mL | Freq: Three times a day (TID) | ORAL | 0 refills | Status: DC | PRN

## 2022-09-08 MED ORDER — AMOXICILLIN 500 MG PO CAPS: 500.0000 mg | ORAL_CAPSULE | Freq: Two times a day (BID) | ORAL | 0 refills | Status: AC

## 2022-09-08 NOTE — Progress Notes (Signed)
Ph: [phone] Fax: [phone]   Patient ID: Alisha Edwards, female    DOB: 12-Nov-1990, 32 y.o.   MRN: [id number]  This visit was conducted in person.  BP 114/66   Pulse 95   Temp (!) 97.3 F (36.3 C) (Temporal)   Ht 5\' 3"  (1.6 m)   Wt 128 lb 8 oz (58.3 kg)   LMP 08/25/2022   SpO2 99%   BMI 22.76 kg/m    CC: acute respiratory illness Subjective:   HPI: Alisha Edwards is a 32 y.o. female presenting on 09/08/2022 for Cough (C/o cough, body aches, fatigue and ST. Sxs 09/07/22 after returning from [place]. )   1d h/o ST, body aches, fatigue, cough occ productive, chills, head congestion, some abd discomfort.   No fevers, ear or tooth pain, nausea/vomiting, diarrhea. No loss of taste or smell.  No sick contacts at home.   Has tried ibuprofen for symptoms.   No h/o asthma.  Non smoker.   First day of symptoms - 09/07/2022.  Just returned from trip to [place]  Had COVID vaccines x3   Has had COVID previously.      Relevant past medical, surgical, family and social history reviewed and updated as indicated. Interim medical history since our last visit reviewed. Allergies and medications reviewed and updated. Outpatient Medications Prior to Visit  Medication Sig Dispense Refill   lisdexamfetamine (VYVANSE) 20 MG capsule Take 1 capsule (20 mg total) by mouth daily. 30 capsule 0   valACYclovir (VALTREX) 500 MG tablet Take 1 tablet (500 mg total) by mouth daily. 90 tablet 0   albuterol (VENTOLIN HFA) 108 (90 Base) MCG/ACT inhaler Inhale 1-2 puffs into the lungs every 6 (six) hours as needed for wheezing or shortness of breath. 6.7 g 0   ALPRAZolam (XANAX) 0.25 MG tablet Take 0.25 mg by mouth at bedtime as needed.     benzonatate (TESSALON) 100 MG capsule Take 1 capsule (100 mg total) by mouth 3 (three) times daily as needed for cough. 21 capsule 0   fluticasone (FLONASE) 50 MCG/ACT nasal spray Place 2 sprays into both nostrils daily. 16 g 0   hydrOXYzine (VISTARIL)  25 MG capsule Take 1 capsule (25 mg total) by mouth every 8 (eight) hours as needed. 30 capsule 0   medroxyPROGESTERone (DEPO-PROVERA) 150 MG/ML injection Inject 150 mg into the muscle every 3 (three) months.     No facility-administered medications prior to visit.     Per HPI unless specifically indicated in ROS section below Review of Systems  Objective:  BP 114/66   Pulse 95   Temp (!) 97.3 F (36.3 C) (Temporal)   Ht 5\' 3"  (1.6 m)   Wt 128 lb 8 oz (58.3 kg)   LMP 08/25/2022   SpO2 99%   BMI 22.76 kg/m   Wt Readings from Last 3 Encounters:  09/08/22 128 lb 8 oz (58.3 kg)  07/27/22 130 lb (59 kg)  02/24/22 132 lb (59.9 kg)      Physical Exam Vitals and nursing note reviewed.  Constitutional:      Appearance: Normal appearance. She is not ill-appearing.  HENT:     Head: Normocephalic and atraumatic.     Right Ear: Tympanic membrane, ear canal and external ear normal. There is no impacted cerumen.     Left Ear: Tympanic membrane, ear canal and external ear normal. There is no impacted cerumen.     Nose: Congestion and rhinorrhea present.     Mouth/Throat:  Comments: Wearing mask Eyes:     Extraocular Movements: Extraocular movements intact.     Conjunctiva/sclera: Conjunctivae normal.     Pupils: Pupils are equal, round, and reactive to light.  Cardiovascular:     Rate and Rhythm: Normal rate and regular rhythm.     Pulses: Normal pulses.     Heart sounds: Normal heart sounds. No murmur heard. Pulmonary:     Effort: Pulmonary effort is normal. No respiratory distress.     Breath sounds: Normal breath sounds. No wheezing, rhonchi or rales.     Comments: Lungs clear Lymphadenopathy:     Head:     Right side of head: No submental, submandibular, tonsillar, preauricular or posterior auricular adenopathy.     Left side of head: No submental, submandibular, tonsillar, preauricular or posterior auricular adenopathy.     Cervical: Cervical adenopathy (R post cervical  LAD) present.     Right cervical: No superficial cervical adenopathy.    Left cervical: No superficial cervical adenopathy.     Upper Body:     Right upper body: No supraclavicular adenopathy.     Left upper body: No supraclavicular adenopathy.  Skin:    Findings: No rash.  Neurological:     Mental Status: She is alert.  Psychiatric:        Mood and Affect: Mood normal.        Behavior: Behavior normal.       Results for orders placed or performed in visit on 09/08/22  POCT rapid strep A  Result Value Ref Range   Rapid Strep A Screen Positive (A) Negative  POC COVID-19 BinaxNow  Result Value Ref Range   SARS Coronavirus 2 Ag Positive (A) Negative    Assessment & Plan:   Problem List Items Addressed This Visit     COVID-19 virus infection - Primary    COVID swab positive Reviewed currently approved antiviral treatments.  Reviewed expected course of illness, anticipated course of recovery, as well as red flags to suggest COVID pneumonia and/or to seek urgent in-person care. Reviewed CDC isolation/quarantine guidelines.  Encouraged fluids and rest. Reviewed further supportive care measures at home including vit C 500mg  bid, vit D 2000 IU daily, zinc 100mg  daily, tylenol PRN, pepcid 20mg  BID PRN.   Recommend:  No antiviral treatment given healthy young adult.  Paxlovid drug interactions:  NA      Strep pharyngitis    Faintly positive RST.  Rx amoxicillin 500mg  BID 10d course. Supportive measures as per instructions. Rec salt water gargles, honey with lemon, herbal tea/popsicle.  Continue ibuprofen PRN.       Other Visit Diagnoses     Sore throat       Relevant Orders   POCT rapid strep A (Completed)   Acute cough       Relevant Orders   POC COVID-19 BinaxNow (Completed)        Meds ordered this encounter  Medications   guaiFENesin-codeine (CHERATUSSIN AC) 100-10 MG/5ML syrup    Sig: Take 5 mLs by mouth 3 (three) times daily as needed for cough (sedation  precautions).    Dispense:  100 mL    Refill:  0   amoxicillin (AMOXIL) 500 MG capsule    Sig: Take 1 capsule (500 mg total) by mouth 2 (two) times daily for 10 days.    Dispense:  20 capsule    Refill:  0    Orders Placed This Encounter  Procedures   POCT rapid strep A  POC COVID-19 BinaxNow    Order Specific Question:   Previously tested for COVID-19    Answer:   Yes    Order Specific Question:   Resident in a congregate (group) care setting    Answer:   No    Order Specific Question:   Employed in healthcare setting    Answer:   No    Order Specific Question:   Pregnant    Answer:   No    Patient Instructions  You've tested positive for COVID infection as well as strep throat.  Use medication as prescribed: cheratussin codeine cough syrup.  Take amoxicillin 500mg  twice daily for 10 days for strep throat. May continue mucinex, ibuprofen as needed (take with food).  Push fluids and plenty of rest. Please return if you are not improving as expected, or if you have high fevers (>101.5) or difficulty swallowing or worsening productive cough. Call clinic with questions.  Good to see you today. I hope you start feeling better soon.  May try vit C 500mg  twice daily, vit D 2000 IU daily, zinc 100mg  daily, tylenol PRN.   Follow up plan: Return if symptoms worsen or fail to improve.  Eustaquio Boyden, MD

## 2022-09-08 NOTE — Assessment & Plan Note (Addendum)
COVID swab positive Reviewed currently approved antiviral treatments.  Reviewed expected course of illness, anticipated course of recovery, as well as red flags to suggest COVID pneumonia and/or to seek urgent in-person care. Reviewed CDC isolation/quarantine guidelines.  Encouraged fluids and rest. Reviewed further supportive care measures at home including vit C 500mg  bid, vit D 2000 IU daily, zinc 100mg  daily, tylenol PRN, pepcid 20mg  BID PRN.   Recommend:  No antiviral treatment given healthy young adult.  Paxlovid drug interactions:  NA

## 2022-09-08 NOTE — Patient Instructions (Addendum)
You've tested positive for COVID infection as well as strep throat.  Use medication as prescribed: cheratussin codeine cough syrup.  Take amoxicillin 500mg  twice daily for 10 days for strep throat. May continue mucinex, ibuprofen as needed (take with food).  Push fluids and plenty of rest. Please return if you are not improving as expected, or if you have high fevers (>101.5) or difficulty swallowing or worsening productive cough. Call clinic with questions.  Good to see you today. I hope you start feeling better soon.  May try vit C 500mg  twice daily, vit D 2000 IU daily, zinc 100mg  daily, tylenol PRN.

## 2022-09-08 NOTE — Assessment & Plan Note (Addendum)
Faintly positive RST.  Rx amoxicillin 500mg  BID 10d course. Supportive measures as per instructions. Rec salt water gargles, honey with lemon, herbal tea/popsicle.  Continue ibuprofen PRN.

## 2022-10-15 ENCOUNTER — Other Ambulatory Visit: Payer: Self-pay | Admitting: Family

## 2022-10-15 DIAGNOSIS — F988 Other specified behavioral and emotional disorders with onset usually occurring in childhood and adolescence: Secondary | ICD-10-CM

## 2022-10-15 DIAGNOSIS — A6009 Herpesviral infection of other urogenital tract: Secondary | ICD-10-CM

## 2022-10-15 MED ORDER — VALACYCLOVIR HCL 500 MG PO TABS
500.0000 mg | ORAL_TABLET | Freq: Every day | ORAL | 0 refills | Status: AC
Start: 2022-10-15 — End: ?

## 2022-10-15 NOTE — Telephone Encounter (Signed)
LM for pt to return call x1 to schedule OV with Tabitha.

## 2022-10-15 NOTE — Telephone Encounter (Signed)
Patient called in returning call she received. Relayed message below about scheduling an appointment. She was wanting to know if this appointment could be virtual due to her work schedule. Please advise. Thank you!

## 2022-10-16 NOTE — Telephone Encounter (Signed)
Patient returned call,I scheduled her for a virtual appointment with tabitha.

## 2022-10-16 NOTE — Telephone Encounter (Signed)
LM for pt to return call x 1 

## 2022-10-21 ENCOUNTER — Telehealth (INDEPENDENT_AMBULATORY_CARE_PROVIDER_SITE_OTHER): Payer: 59 | Admitting: Family

## 2022-10-21 ENCOUNTER — Encounter: Payer: Self-pay | Admitting: Family

## 2022-10-21 DIAGNOSIS — F988 Other specified behavioral and emotional disorders with onset usually occurring in childhood and adolescence: Secondary | ICD-10-CM

## 2022-10-21 MED ORDER — LISDEXAMFETAMINE DIMESYLATE 10 MG PO CAPS
10.0000 mg | ORAL_CAPSULE | Freq: Every day | ORAL | 0 refills | Status: DC
Start: 2022-11-20 — End: 2023-02-23

## 2022-10-21 MED ORDER — LISDEXAMFETAMINE DIMESYLATE 10 MG PO CAPS: 10.0000 mg | ORAL_CAPSULE | Freq: Every day | ORAL | 0 refills | Status: DC

## 2022-10-21 NOTE — Assessment & Plan Note (Signed)
Trial decrease vyvanse to 10 mg  Pt declines anxiety medication at this time.  Pdmp reviewed.  UDS due after 12/21/22

## 2022-10-21 NOTE — Progress Notes (Signed)
Virtual Visit via Video note  I connected with Alisha Edwards on 10/21/22 at home by video and verified that I am speaking with the correct person using two identifiers.The provider, Mort Sawyers, FNP is located in their office at time of visit.  I discussed the limitations, risks, security and privacy concerns of performing an evaluation and management service by video and the availability of in person appointments. I also discussed with the patient that there may be a patient responsible charge related to this service. The patient expressed understanding and agreed to proceed.  Subjective: PCP: Mort Sawyers, FNP  Chief Complaint  Patient presents with   Follow-up    Medication refill    HPI    ADD: vyvanse 20 mg once daily. Last fill was 05/09/22 Increased stressors at work, starting to bother her more and increased anxiety. Multiple tasks, and deals with people with FMLA.  She is thinking that if she regularly takes her vyvanse she will be able to better tolerate her stressors, and focus more on the task.  Would like to decrease dose as she does state it makes her feel 'numb' where as something a little less would be better for her. She does not want to try any other medications at this time.   UDS due 12/20/21        ROS: Per HPI  Current Outpatient Medications:    lisdexamfetamine (VYVANSE) 10 MG capsule, Take 1 capsule (10 mg total) by mouth daily before breakfast., Disp: 30 capsule, Rfl: 0   [START ON 11/20/2022] lisdexamfetamine (VYVANSE) 10 MG capsule, Take 1 capsule (10 mg total) by mouth daily before breakfast., Disp: 30 capsule, Rfl: 0   [START ON 12/20/2022] lisdexamfetamine (VYVANSE) 10 MG capsule, Take 1 capsule (10 mg total) by mouth daily before breakfast., Disp: 30 capsule, Rfl: 0   valACYclovir (VALTREX) 500 MG tablet, Take 1 tablet (500 mg total) by mouth daily., Disp: 90 tablet, Rfl: 0  Observations/Objective: Physical Exam Constitutional:       General: She is not in acute distress.    Appearance: Normal appearance. She is not ill-appearing.  Pulmonary:     Effort: Pulmonary effort is normal.  Neurological:     General: No focal deficit present.     Mental Status: She is alert and oriented to person, place, and time.  Psychiatric:        Mood and Affect: Mood normal.        Behavior: Behavior normal.        Thought Content: Thought content normal.     Assessment and Plan: Attention deficit disorder (ADD) without hyperactivity Assessment & Plan: Trial decrease vyvanse to 10 mg  Pt declines anxiety medication at this time.  Pdmp reviewed.  UDS due after 12/21/22  Orders: -     Lisdexamfetamine Dimesylate; Take 1 capsule (10 mg total) by mouth daily before breakfast.  Dispense: 30 capsule; Refill: 0 -     Lisdexamfetamine Dimesylate; Take 1 capsule (10 mg total) by mouth daily before breakfast.  Dispense: 30 capsule; Refill: 0 -     Lisdexamfetamine Dimesylate; Take 1 capsule (10 mg total) by mouth daily before breakfast.  Dispense: 30 capsule; Refill: 0    Follow Up Instructions: Return in about 3 months (around 01/20/2023) for f/u CPE.   I discussed the assessment and treatment plan with the patient. The patient was provided an opportunity to ask questions and all were answered. The patient agreed with the plan and demonstrated an understanding  of the instructions.   The patient was advised to call back or seek an in-person evaluation if the symptoms worsen or if the condition fails to improve as anticipated.  The above assessment and management plan was discussed with the patient. The patient verbalized understanding of and has agreed to the management plan. Patient is aware to call the clinic if symptoms persist or worsen. Patient is aware when to return to the clinic for a follow-up visit. Patient educated on when it is appropriate to go to the emergency department.     Mort Sawyers, MSN, APRN, FNP-C [place]  [facility]

## 2022-11-19 ENCOUNTER — Encounter: Payer: Self-pay | Admitting: Family

## 2022-12-01 ENCOUNTER — Ambulatory Visit: Payer: 59 | Admitting: Family

## 2022-12-02 ENCOUNTER — Encounter: Payer: Self-pay | Admitting: Family

## 2022-12-08 ENCOUNTER — Telehealth (INDEPENDENT_AMBULATORY_CARE_PROVIDER_SITE_OTHER): Payer: 59 | Admitting: Family

## 2022-12-08 ENCOUNTER — Encounter: Payer: Self-pay | Admitting: Family

## 2022-12-08 DIAGNOSIS — F418 Other specified anxiety disorders: Secondary | ICD-10-CM | POA: Diagnosis not present

## 2022-12-08 DIAGNOSIS — F988 Other specified behavioral and emotional disorders with onset usually occurring in childhood and adolescence: Secondary | ICD-10-CM | POA: Diagnosis not present

## 2022-12-08 MED ORDER — SERTRALINE HCL 50 MG PO TABS
50.0000 mg | ORAL_TABLET | Freq: Every day | ORAL | 0 refills | Status: DC
Start: 2022-12-08 — End: 2023-01-06

## 2022-12-08 NOTE — Assessment & Plan Note (Signed)
Vyvanse 10 mg once daily prn ADD  Pdmp reviewed.

## 2022-12-08 NOTE — Patient Instructions (Addendum)
------------------------------------    Start sertraline 50 mg for anxiety and depression. Take 1/2 tablet by mouth once daily for about one week, then increase to 1 full tablet thereafter.   Taking the medicine as directed and not missing any doses is one of the best things you can do to treat your anxiety/depression.  Here are some things to keep in mind:  Side effects (stomach upset, some increased anxiety) may happen before you notice a benefit.  These side effects typically go away over time. Changes to your dose of medicine or a change in medication all together is sometimes necessary Many people will notice an improvement within two weeks but the full effect of the medication can take up to 4-6 weeks Stopping the medication when you start feeling better often results in a return of symptoms. Most people need to be on medication at least 6-12 months If you start having thoughts of hurting yourself or others after starting this medicine, please call me immediately.    ------------------------------------  A referral was placed today for psychology/therapy Please let us know if you have not heard back within 2 weeks about the referral.   Regards,   Linnie Mcglocklin FNP-C

## 2022-12-08 NOTE — Progress Notes (Signed)
Virtual Visit via Video note  I connected with Alisha Edwards on 12/08/22 at home by video and verified that I am speaking with the correct person using two identifiers.The provider, Mort Sawyers, FNP is located in their office at time of visit.  I discussed the limitations, risks, security and privacy concerns of performing an evaluation and management service by video and the availability of in person appointments. I also discussed with the patient that there may be a patient responsible charge related to this service. The patient expressed understanding and agreed to proceed.  Subjective: PCP: Mort Sawyers, FNP  Chief Complaint  Patient presents with   Medical Management of Chronic Issues    HPI  Increased stressors, job is pushing her a lot with her duties. 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 She finds she is really struggling especially with anxiety and depressive symptoms. This is also contributing to worsening concentration. She states Mondays are the worst, and she starts to 'shut down' on Sunday just thinking about work the next day. She has to sit all day as well during her job, which is difficult for her as she likes to move around which she feels would help her stress. She states in the past used to get mental breaks however this has stopped due to increased call volume.   She is not eligible for FMLA as she has not yet been at her job, but she will be eligible 01/15/23.   ADD: she is currently on vyvanse 10 mg once daily.. due to increased stress, her concentration is worsening and she is unable to remain as focused as she did prior.   She feels at this time, more with depression than anything. She has taken fluoxetine as well as lexapro. No suicidal plan, however at times does feel she 'doesn't want to be here'. She does not remember if she has had any issues with the medications.  ROS: Per HPI  Current  Outpatient Medications:    lisdexamfetamine (VYVANSE) 10 MG capsule, Take 1 capsule (10 mg total) by mouth daily before breakfast., Disp: 30 capsule, Rfl: 0   [START ON 12/20/2022] lisdexamfetamine (VYVANSE) 10 MG capsule, Take 1 capsule (10 mg total) by mouth daily before breakfast., Disp: 30 capsule, Rfl: 0   sertraline (ZOLOFT) 50 MG tablet, Take 1 tablet (50 mg total) by mouth daily., Disp: 90 tablet, Rfl: 0   valACYclovir (VALTREX) 500 MG tablet, Take 1 tablet (500 mg total) by mouth daily., Disp: 90 tablet, Rfl: 0   lisdexamfetamine (VYVANSE) 10 MG capsule, Take 1 capsule (10 mg total) by mouth daily before breakfast., Disp: 30 capsule, Rfl: 0  Observations/Objective: Physical Exam Constitutional:      General: She is not in acute distress.    Appearance: Normal appearance. She is not ill-appearing.  Pulmonary:     Effort: Pulmonary effort is normal.  Neurological:     General: No focal deficit present.     Mental Status: She is alert and oriented to person, place, and time.  Psychiatric:        Mood and Affect: Mood normal. Affect is tearful.        Behavior: Behavior normal.        Thought Content: Thought content normal. Thought content does not include homicidal or suicidal plan.     Assessment and Plan: Anxiety with depression Assessment & Plan: I instructed pt to start sertraline  50 mg1/2 tablet once daily for 1 week and then increase to a full tablet once daily on week two as tolerated.  We discussed common side effects such as nausea, drowsiness and weight gain.  Also discussed rare but serious side effect of suicidal ideation.  She is instructed to discontinue medication and go directly to ED if this occurs.  Pt verbalizes understanding.  Plan is to follow up in 30 days to evaluate progress.    Anxiety depression really affecting her job and duties at this time, unable to concentrate/focus and with tearful effect limiting her ability to work on calls, communicate with  consumers as well as sit down all day without being uncomfortable. She is going to start 10/28 today and go back 02/11/23   Orders: -     Sertraline HCl; Take 1 tablet (50 mg total) by mouth daily.  Dispense: 90 tablet; Refill: 0 -     Ambulatory referral to Psychology  Attention deficit disorder (ADD) without hyperactivity Assessment & Plan: Vyvanse 10 mg once daily prn ADD  Pdmp reviewed.     Follow Up Instructions: Return in about 3 weeks (around 12/29/2022) for follow up FMLA in office.   I discussed the assessment and treatment plan with the patient. The patient was provided an opportunity to ask questions and all were answered. The patient agreed with the plan and demonstrated an understanding of the instructions.   The patient was advised to call back or seek an in-person evaluation if the symptoms worsen or if the condition fails to improve as anticipated.  The above assessment and management plan was discussed with the patient. The patient verbalized understanding of and has agreed to the management plan. Patient is aware to call the clinic if symptoms persist or worsen. Patient is aware when to return to the clinic for a follow-up visit. Patient educated on when it is appropriate to go to the emergency department.     Mort Sawyers, MSN, APRN, FNP-C [facility]

## 2022-12-08 NOTE — Assessment & Plan Note (Addendum)
I instructed pt to start sertraline 50 mg1/2 tablet once daily for 1 week and then increase to a full tablet once daily on week two as tolerated.  We discussed common side effects such as nausea, drowsiness and weight gain.  Also discussed rare but serious side effect of suicidal ideation.  She is instructed to discontinue medication and go directly to ED if this occurs.  Pt verbalizes understanding.  Plan is to follow up in 30 days to evaluate progress.    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 She is going to start 10/28 today and go back 02/11/23

## 2022-12-08 NOTE — Telephone Encounter (Signed)
Pt was seen by video visit 12/08/22.

## 2022-12-10 ENCOUNTER — Telehealth: Payer: Self-pay | Admitting: Family

## 2022-12-10 NOTE — Telephone Encounter (Signed)
Type of forms received: FMLA  Routed to: faxed to our office by HIM  Paperwork received by : office    Individual made aware of 3-5 business day turn around (Y/N): Y       Form completed and patient made aware of charges(Y/N): Y    Faxed to :   Form location:  [employer]

## 2022-12-10 NOTE — Telephone Encounter (Signed)
Forms placed in Tabitha's inbox.

## 2022-12-12 NOTE — Telephone Encounter (Signed)
FMLA forms completed, in outbox

## 2022-12-12 NOTE — Telephone Encounter (Signed)
Forms have been faxed in, a copy has been made for the chart, originals have been placed up front for pick up. Pt is aware. Nothing further was needed at this time.

## 2023-01-06 ENCOUNTER — Encounter: Payer: Self-pay | Admitting: Family

## 2023-01-06 ENCOUNTER — Ambulatory Visit (INDEPENDENT_AMBULATORY_CARE_PROVIDER_SITE_OTHER): Payer: 59 | Admitting: Family

## 2023-01-06 VITALS — BP 100/60 | HR 70 | Temp 98.0°F | Ht 64.0 in | Wt 132.4 lb

## 2023-01-06 DIAGNOSIS — Z13220 Encounter for screening for lipoid disorders: Secondary | ICD-10-CM

## 2023-01-06 DIAGNOSIS — D72820 Lymphocytosis (symptomatic): Secondary | ICD-10-CM

## 2023-01-06 DIAGNOSIS — E559 Vitamin D deficiency, unspecified: Secondary | ICD-10-CM | POA: Diagnosis not present

## 2023-01-06 DIAGNOSIS — M62838 Other muscle spasm: Secondary | ICD-10-CM | POA: Diagnosis not present

## 2023-01-06 DIAGNOSIS — R739 Hyperglycemia, unspecified: Secondary | ICD-10-CM | POA: Diagnosis not present

## 2023-01-06 DIAGNOSIS — E538 Deficiency of other specified B group vitamins: Secondary | ICD-10-CM | POA: Diagnosis not present

## 2023-01-06 DIAGNOSIS — M542 Cervicalgia: Secondary | ICD-10-CM

## 2023-01-06 DIAGNOSIS — F418 Other specified anxiety disorders: Secondary | ICD-10-CM

## 2023-01-06 LAB — CBC WITH DIFFERENTIAL/PLATELET
Basophils Absolute: 0 10*3/uL (ref 0.0–0.1)
Basophils Relative: 0.9 % (ref 0.0–3.0)
Eosinophils Absolute: 0.1 10*3/uL (ref 0.0–0.7)
Eosinophils Relative: 1 % (ref 0.0–5.0)
HCT: 42.2 % (ref 36.0–46.0)
Hemoglobin: 13.9 g/dL (ref 12.0–15.0)
Lymphocytes Relative: 50.5 % — ABNORMAL HIGH (ref 12.0–46.0)
Lymphs Abs: 2.5 10*3/uL (ref 0.7–4.0)
MCHC: 32.9 g/dL (ref 30.0–36.0)
MCV: 92.1 fL (ref 78.0–100.0)
Monocytes Absolute: 0.4 10*3/uL (ref 0.1–1.0)
Monocytes Relative: 7.6 % (ref 3.0–12.0)
Neutro Abs: 2 10*3/uL (ref 1.4–7.7)
Neutrophils Relative %: 40 % — ABNORMAL LOW (ref 43.0–77.0)
Platelets: 295 10*3/uL (ref 150.0–400.0)
RBC: 4.58 Mil/uL (ref 3.87–5.11)
RDW: 13.4 % (ref 11.5–15.5)
WBC: 4.9 10*3/uL (ref 4.0–10.5)

## 2023-01-06 LAB — BASIC METABOLIC PANEL
BUN: 18 mg/dL (ref 6–23)
CO2: 30 meq/L (ref 19–32)
Calcium: 9.5 mg/dL (ref 8.4–10.5)
Chloride: 102 meq/L (ref 96–112)
Creatinine, Ser: 0.9 mg/dL (ref 0.40–1.20)
GFR: 84.66 mL/min (ref >60.00)
Glucose, Bld: 80 mg/dL (ref 70–99)
Potassium: 4.1 meq/L (ref 3.5–5.1)
Sodium: 139 meq/L (ref 135–145)

## 2023-01-06 LAB — LIPID PANEL
Cholesterol: 209 mg/dL — ABNORMAL HIGH (ref 0–200)
HDL: 89.5 mg/dL (ref >39.00)
LDL Cholesterol: 108 mg/dL — ABNORMAL HIGH (ref 0–99)
NonHDL: 119.73
Total CHOL/HDL Ratio: 2
Triglycerides: 57 mg/dL (ref 0.0–149.0)
VLDL: 11.4 mg/dL (ref 0.0–40.0)

## 2023-01-06 LAB — VITAMIN D 25 HYDROXY (VIT D DEFICIENCY, FRACTURES): VITD: 23.45 ng/mL — ABNORMAL LOW (ref 30.00–100.00)

## 2023-01-06 LAB — VITAMIN B12: Vitamin B-12: 330 pg/mL (ref 211–911)

## 2023-01-06 LAB — HEMOGLOBIN A1C: Hgb A1c MFr Bld: 5.6 % (ref 4.6–6.5)

## 2023-01-06 MED ORDER — KETOROLAC TROMETHAMINE 30 MG/ML IJ SOLN
30.0000 mg | Freq: Once | INTRAMUSCULAR | Status: AC
  Administered 2023-01-06: 30 mg via INTRAMUSCULAR

## 2023-01-06 MED ORDER — DULOXETINE HCL 30 MG PO CPEP: 30.0000 mg | ORAL_CAPSULE | Freq: Every day | ORAL | 0 refills | Status: DC

## 2023-01-06 MED ORDER — KETOROLAC TROMETHAMINE 30 MG/ML IJ SOLN: 30.0000 mg | Freq: Once | INTRAMUSCULAR | Status: DC

## 2023-01-06 MED ORDER — TIZANIDINE HCL 4 MG PO TABS: 4.0000 mg | ORAL_TABLET | Freq: Four times a day (QID) | ORAL | 0 refills | Status: DC | PRN

## 2023-01-06 MED ORDER — PREDNISONE 10 MG (21) PO TBPK: ORAL_TABLET | ORAL | 0 refills | Status: DC

## 2023-01-06 NOTE — Assessment & Plan Note (Signed)
Stopped sertraline  Start cymbalta 30 mg once daily.  Keep appt with therapist

## 2023-01-06 NOTE — Patient Instructions (Addendum)
  Injection today in office.  Muscle relaxer as needed for tightness. Heat to neck as well.  Work on neck exercises every morning.  Prednisone pack, take this and complete (do not take ibuprofen while taking prednisone)  You can take tylenol 500 also as needed Lidocaine patch might also be helpful.   Start cymbalta next week after done with the medications.    Regards,   Mort Sawyers FNP-C

## 2023-01-06 NOTE — Progress Notes (Unsigned)
Established Patient Office Visit  Subjective:   Patient ID: Alisha Edwards, female    DOB: January 22, 1991  Age: 32 y.o. MRN: [id number]  CC:  Chief Complaint  Patient presents with   Back Pain    HPI: Alisha Edwards is a 32 y.o. female presenting on 01/06/2023 for Back Pain  About one month ago started with a pain that she couldn't figure out if it was her neck and or back, and still not quite sure. She states since then the pain has worsened, now when getting up in the am she has to sit up and 'get myself together'. She states in the am the pain is about 10/10.   She describes the pain as a jarring pain in her back and neck when it occurs. Most often happens when waking up and trying to sit up in the am. Once she gets up and gets going she states the pain gradually eases up during the day.   Just now when she went to get up from a sitting position she felt a 10/10 pain, and again with walking and starting to move it feels slightly better. Taking ibuprofen 800 mg once daily but has not helped at all.   She feels the pain at the base of her posterior neck and radiates to both sides of her shoulders. Pain worse with forward flexion and external bil rotation.   Anxiety depression: didn't like how sertraline made her feel, felt zombie like. She stopped it. She has tried lexapro and prozac as well without improvement.      ROS: Negative unless specifically indicated above in HPI.   Relevant past medical history reviewed and updated as indicated.   Allergies and medications reviewed and updated.   Current Outpatient Medications:    lisdexamfetamine (VYVANSE) 10 MG capsule, Take 1 capsule (10 mg total) by mouth daily before breakfast., Disp: 30 capsule, Rfl: 0   lisdexamfetamine (VYVANSE) 10 MG capsule, Take 1 capsule (10 mg total) by mouth daily before breakfast., Disp: 30 capsule, Rfl: 0   sertraline (ZOLOFT) 50 MG tablet, Take 1 tablet (50 mg total) by mouth daily., Disp: 90  tablet, Rfl: 0   valACYclovir (VALTREX) 500 MG tablet, Take 1 tablet (500 mg total) by mouth daily., Disp: 90 tablet, Rfl: 0  Allergies  Allergen Reactions   Shellfish Allergy Itching and Swelling    Mouth swells and itches   Latex Rash    Objective:   BP 100/60   Pulse 70   Temp 98 F (36.7 C)   Ht 5\' 4"  (1.626 m)   Wt 132 lb 7.2 oz (60.1 kg)   SpO2 99%   BMI 22.73 kg/m    Physical Exam Constitutional:      General: She is not in acute distress.    Appearance: Normal appearance. She is normal weight. She is not ill-appearing, toxic-appearing or diaphoretic.  HENT:     Head: Normocephalic.  Cardiovascular:     Rate and Rhythm: Normal rate.  Pulmonary:     Effort: Pulmonary effort is normal.  Musculoskeletal:        General: Normal range of motion.     Cervical back: Rigidity present. No erythema. Pain with movement and muscular tenderness present.  Neurological:     General: No focal deficit present.     Mental Status: She is alert and oriented to person, place, and time. Mental status is at baseline.  Psychiatric:        Mood  and Affect: Mood normal.        Behavior: Behavior normal.        Thought Content: Thought content normal.        Judgment: Judgment normal.     Assessment & Plan:  There are no diagnoses linked to this encounter.   Follow up plan: No follow-ups on file.  Mort Sawyers, FNP

## 2023-01-07 ENCOUNTER — Encounter: Payer: Self-pay | Admitting: Family

## 2023-01-07 ENCOUNTER — Other Ambulatory Visit: Payer: Self-pay | Admitting: Family

## 2023-01-07 DIAGNOSIS — E559 Vitamin D deficiency, unspecified: Secondary | ICD-10-CM | POA: Insufficient documentation

## 2023-01-07 DIAGNOSIS — E538 Deficiency of other specified B group vitamins: Secondary | ICD-10-CM | POA: Insufficient documentation

## 2023-01-07 DIAGNOSIS — M62838 Other muscle spasm: Secondary | ICD-10-CM | POA: Insufficient documentation

## 2023-01-07 MED ORDER — CHOLECALCIFEROL 1.25 MG (50000 UT) PO TABS: 1.0000 | ORAL_TABLET | ORAL | 0 refills | Status: AC

## 2023-01-07 NOTE — Assessment & Plan Note (Signed)
Conservative measures discussed Tizanidine prn  Heat to site, lidocaine patches, neck exercises, tylenol ibpurofen prn  Toradol 30 mg IM in office.  Prednisone pack given Advised do not take ibuprofen while on prednisone pack  If no improvement consider PT and xray Neck exercises handout given to pt

## 2023-01-16 ENCOUNTER — Telehealth: Payer: 59 | Admitting: Physician Assistant

## 2023-01-16 ENCOUNTER — Telehealth: Payer: 59

## 2023-01-16 DIAGNOSIS — B3731 Acute candidiasis of vulva and vagina: Secondary | ICD-10-CM

## 2023-01-16 MED ORDER — FLUCONAZOLE 150 MG PO TABS: 150.0000 mg | ORAL_TABLET | ORAL | 0 refills | Status: DC | PRN

## 2023-01-16 NOTE — Progress Notes (Signed)
Virtual Visit Consent   Alisha Edwards, you are scheduled for a virtual visit with a [facility] provider today. Just as with appointments in the office, your consent must be obtained to participate. Your consent will be active for this visit and any virtual visit you may have with one of our providers in the next 365 days. If you have a MyChart account, a copy of this consent can be sent to you electronically.  As this is a virtual visit, video technology does not allow for your provider to perform a traditional examination. This may limit your provider's ability to fully assess your condition. If your provider identifies any concerns that need to be evaluated in person or the need to arrange testing (such as labs, EKG, etc.), we will make arrangements to do so. Although advances in technology are sophisticated, we cannot ensure that it will always work on either your end or our end. If the connection with a video visit is poor, the visit may have to be switched to a telephone visit. With either a video or telephone visit, we are not always able to ensure that we have a secure connection.  By engaging in this virtual visit, you consent to the provision of healthcare and authorize for your insurance to be billed (if applicable) for the services provided during this visit. Depending on your insurance coverage, you may receive a charge related to this service.  I need to obtain your verbal consent now. Are you willing to proceed with your visit today? Alisha Edwards has provided verbal consent on 01/16/2023 for a virtual visit (video or telephone). Margaretann Loveless, PA-C  Date: 01/16/2023 12:04 PM  Virtual Visit via Video Note   I, Margaretann Loveless, connected with  Alisha Edwards  ([id number], Jan 10, 1991) on 01/16/23 at 12:00 PM EST by a video-enabled telemedicine application and verified that I am speaking with the correct person using two identifiers.  Location: Patient: Virtual Visit  Location Patient: Home Provider: Virtual Visit Location Provider: Home Office   I discussed the limitations of evaluation and management by telemedicine and the availability of in person appointments. The patient expressed understanding and agreed to proceed.    History of Present Illness: Alisha Edwards is a 32 y.o. who identifies as a female who was assigned female at birth, and is being seen today for vaginal discharge, possible yeast infection.  HPI: Vaginal Discharge The patient's primary symptoms include genital itching and vaginal discharge. The patient's pertinent negatives include no genital lesions or genital odor. This is a recurrent problem. The current episode started in the past 7 days. The problem occurs constantly. The problem has been gradually worsening. The pain is mild. She is not pregnant. Pertinent negatives include no chills, dysuria, fever, headaches or nausea. The vaginal discharge was white and thick. There has been no bleeding. She has not been passing clots. She has not been passing tissue. Exacerbated by: change of soap. She has tried nothing for the symptoms. The treatment provided no relief.     Problems:  Patient Active Problem List   Diagnosis Date Noted   Muscle spasm 01/07/2023   Vitamin D deficiency 01/07/2023   Low serum vitamin B12 01/07/2023   Elevated lymphocyte count 01/06/2023   Cervicalgia 01/06/2023   Attention deficit disorder (ADD) without hyperactivity 12/20/2021   Cryptic tonsil 06/06/2021   Allergic rhinitis due to allergen 06/06/2021   Intractable headache 06/06/2021   Anxiety with depression 05/22/2021   On Depo-Provera  for contraception 05/22/2021   Herpes genitalis 05/22/2021   Hyperglycemia 05/22/2021   Anemia 05/22/2021   Psoriasis 05/22/2021   Mood disorder (HCC) 08/19/2013   HSV (herpes simplex virus) infection     Allergies:  Allergies  Allergen Reactions   Sertraline Other (See Comments)    Zombie like   Shellfish  Allergy Itching and Swelling    Mouth swells and itches   Latex Rash   Medications:  Current Outpatient Medications:    fluconazole (DIFLUCAN) 150 MG tablet, Take 1 tablet (150 mg total) by mouth every 3 (three) days as needed., Disp: 2 tablet, Rfl: 0   Cholecalciferol 1.25 MG (50000 UT) TABS, Take 1 tablet by mouth once a week., Disp: 8 tablet, Rfl: 0   DULoxetine (CYMBALTA) 30 MG capsule, Take 1 capsule (30 mg total) by mouth daily., Disp: 90 capsule, Rfl: 0   lisdexamfetamine (VYVANSE) 10 MG capsule, Take 1 capsule (10 mg total) by mouth daily before breakfast., Disp: 30 capsule, Rfl: 0   lisdexamfetamine (VYVANSE) 10 MG capsule, Take 1 capsule (10 mg total) by mouth daily before breakfast., Disp: 30 capsule, Rfl: 0   predniSONE (STERAPRED UNI-PAK 21 TAB) 10 MG (21) TBPK tablet, Take as directed, Disp: 1 each, Rfl: 0   tiZANidine (ZANAFLEX) 4 MG tablet, Take 1 tablet (4 mg total) by mouth every 6 (six) hours as needed for muscle spasms., Disp: 30 tablet, Rfl: 0   valACYclovir (VALTREX) 500 MG tablet, Take 1 tablet (500 mg total) by mouth daily., Disp: 90 tablet, Rfl: 0  Observations/Objective: Patient is well-developed, well-nourished in no acute distress.  Resting comfortably at home.  Head is normocephalic, atraumatic.  No labored breathing.  Speech is clear and coherent with logical content.  Patient is alert and oriented at baseline.    Assessment and Plan: 1. Yeast vaginitis - fluconazole (DIFLUCAN) 150 MG tablet; Take 1 tablet (150 mg total) by mouth every 3 (three) days as needed.  Dispense: 2 tablet; Refill: 0  - Symptoms consistent with yeast vaginitis - Fluconazole prescribed - Limit bubble baths, scented lotions/soaps/detergents - Limit tight fitting clothing - Seek on person evaluation if not improving or if symptoms worsen   Follow Up Instructions: I discussed the assessment and treatment plan with the patient. The patient was provided an opportunity to ask  questions and all were answered. The patient agreed with the plan and demonstrated an understanding of the instructions.  A copy of instructions were sent to the patient via MyChart unless otherwise noted below.    The patient was advised to call back or seek an in-person evaluation if the symptoms worsen or if the condition fails to improve as anticipated.    Margaretann Loveless, PA-C

## 2023-01-16 NOTE — Patient Instructions (Signed)
Alisha Edwards, thank you for joining Margaretann Loveless, PA-C for today's virtual visit.  While this provider is not your primary care provider (PCP), if your PCP is located in our provider database this encounter information will be shared with them immediately following your visit.   A [facility] MyChart account gives you access to today's visit and all your visits, tests, and labs performed at [facility] " click here if you don't have a [facility] MyChart account or go to [email or web address]  Consent: (Patient) Alisha Edwards provided verbal consent for this virtual visit at the beginning of the encounter.  Current Medications:  Current Outpatient Medications:    fluconazole (DIFLUCAN) 150 MG tablet, Take 1 tablet (150 mg total) by mouth every 3 (three) days as needed., Disp: 2 tablet, Rfl: 0   Cholecalciferol 1.25 MG (50000 UT) TABS, Take 1 tablet by mouth once a week., Disp: 8 tablet, Rfl: 0   DULoxetine (CYMBALTA) 30 MG capsule, Take 1 capsule (30 mg total) by mouth daily., Disp: 90 capsule, Rfl: 0   lisdexamfetamine (VYVANSE) 10 MG capsule, Take 1 capsule (10 mg total) by mouth daily before breakfast., Disp: 30 capsule, Rfl: 0   lisdexamfetamine (VYVANSE) 10 MG capsule, Take 1 capsule (10 mg total) by mouth daily before breakfast., Disp: 30 capsule, Rfl: 0   predniSONE (STERAPRED UNI-PAK 21 TAB) 10 MG (21) TBPK tablet, Take as directed, Disp: 1 each, Rfl: 0   tiZANidine (ZANAFLEX) 4 MG tablet, Take 1 tablet (4 mg total) by mouth every 6 (six) hours as needed for muscle spasms., Disp: 30 tablet, Rfl: 0   valACYclovir (VALTREX) 500 MG tablet, Take 1 tablet (500 mg total) by mouth daily., Disp: 90 tablet, Rfl: 0   Medications ordered in this encounter:  Meds ordered this encounter  Medications   fluconazole (DIFLUCAN) 150 MG tablet    Sig: Take 1 tablet (150 mg total) by mouth every 3 (three) days as needed.    Dispense:  2 tablet    Refill:  0    Order  Specific Question:   Supervising Provider    Answer:   Merrilee Jansky [id number]     *If you need refills on other medications prior to your next appointment, please contact your pharmacy*  Follow-Up: Call back or seek an in-person evaluation if the symptoms worsen or if the condition fails to improve as anticipated.  [facility] [phone]  Other Instructions Vaginal Probiotics: AZO vaginal probiotic OLLY Happy Hoo-Ha RAW Vaginal Care RenewLife Women's vaginal probiotic RepHresh Pro-B  Vaginal washes: Honey Pot Summer's Eve Vagisil Feminine cleanser  Healthy vaginal hygiene practices    -  Avoid sleeper pajamas. Nightgowns allow air to circulate.  Sleep without underpants whenever possible.   -  Wear cotton underpants during the day. Double-rinse underwear after washing to avoid residual irritants. Do not use fabric softeners for underwear and swimsuits.   - Avoid tights, leotards, leggings, "skinny" jeans, and other tight-fitting clothing. Skirts and loose-fitting pants allow air to circulate.   - Avoid pantyliners.  Instead use tampons or cotton pads.   - Use the restroom after intercourse to help prevent UTI's   - Daily warm bathing is helpful:     - Soak in clean water (no soap) for 10 to 15 minutes. Adding vinegar or baking soda to the water has not been specifically studied and may not be better than clean water alone.      - Use soap  to wash regions other than the genital area just before getting out of the tub. Limit use of any soap on genital areas. Use fragance-free soaps.     - Rinse the genital area well and gently pat dry.  Don't rub.  Hair dryer to assist with drying can be used only if on cool setting.     - Do not use bubble baths or perfumed soaps.   - Do not use any feminine sprays, douches or powders.  These contain chemicals that will irritate the skin.   - If the genital area is tender or swollen, cool compresses may relieve the  discomfort. Unscented wet wipes can be used instead of toilet paper for wiping.    - Emollients, such as Vaseline, may help protect skin and can be applied to the irritated area.   - Always remember to wipe front-to-back after bowel movements. Pat dry after urination.   - Do not sit in wet swimsuits for long periods of time after swimming    If you have been instructed to have an in-person evaluation today at a local Urgent Care facility, please use the link below. It will take you to a list of all of our available [facility], including address, phone number and hours of operation. Please do not delay care.  [facility]  If you or a family member do not have a primary care provider, use the link below to schedule a visit and establish care. When you choose a [facility] primary care physician or advanced practice provider, you gain a long-term partner in health. Find a Primary Care Provider  Learn more about [facility] in-office and virtual care options:  - Get Care Now

## 2023-01-21 ENCOUNTER — Ambulatory Visit (INDEPENDENT_AMBULATORY_CARE_PROVIDER_SITE_OTHER): Payer: 59 | Admitting: Psychology

## 2023-01-21 DIAGNOSIS — F418 Other specified anxiety disorders: Secondary | ICD-10-CM | POA: Diagnosis not present

## 2023-01-21 DIAGNOSIS — F988 Other specified behavioral and emotional disorders with onset usually occurring in childhood and adolescence: Secondary | ICD-10-CM
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
## 2023-01-22 ENCOUNTER — Ambulatory Visit: Payer: Medicaid Other | Admitting: Nurse Practitioner

## 2023-01-23 ENCOUNTER — Encounter: Payer: Self-pay | Admitting: Family

## 2023-01-27 ENCOUNTER — Ambulatory Visit (INDEPENDENT_AMBULATORY_CARE_PROVIDER_SITE_OTHER): Payer: 59 | Admitting: Family

## 2023-01-27 VITALS — BP 100/74 | HR 72 | Temp 98.3°F | Ht 64.0 in | Wt 129.0 lb

## 2023-01-27 DIAGNOSIS — R1030 Lower abdominal pain, unspecified: Secondary | ICD-10-CM

## 2023-01-27 DIAGNOSIS — K5900 Constipation, unspecified: Secondary | ICD-10-CM

## 2023-01-27 DIAGNOSIS — R1012 Left upper quadrant pain: Secondary | ICD-10-CM

## 2023-01-27 DIAGNOSIS — F418 Other specified anxiety disorders: Secondary | ICD-10-CM

## 2023-01-27 LAB — CBC WITH DIFFERENTIAL/PLATELET
Basophils Absolute: 0 10*3/uL (ref 0.0–0.1)
Basophils Relative: 0.6 % (ref 0.0–3.0)
Eosinophils Absolute: 0.1 10*3/uL (ref 0.0–0.7)
Eosinophils Relative: 1.3 % (ref 0.0–5.0)
HCT: 39.7 % (ref 36.0–46.0)
Hemoglobin: 13.2 g/dL (ref 12.0–15.0)
Lymphocytes Relative: 49 % — ABNORMAL HIGH (ref 12.0–46.0)
Lymphs Abs: 2.3 10*3/uL (ref 0.7–4.0)
MCHC: 33.2 g/dL (ref 30.0–36.0)
MCV: 91.9 fL (ref 78.0–100.0)
Monocytes Absolute: 0.4 10*3/uL (ref 0.1–1.0)
Monocytes Relative: 8.6 % (ref 3.0–12.0)
Neutro Abs: 1.9 10*3/uL (ref 1.4–7.7)
Neutrophils Relative %: 40.5 % — ABNORMAL LOW (ref 43.0–77.0)
Platelets: 247 10*3/uL (ref 150.0–400.0)
RBC: 4.32 Mil/uL (ref 3.87–5.11)
RDW: 13.1 % (ref 11.5–15.5)
WBC: 4.8 10*3/uL (ref 4.0–10.5)

## 2023-01-27 LAB — LIPASE: Lipase: 42 U/L (ref 11.0–59.0)

## 2023-01-27 LAB — AMYLASE: Amylase: 49 U/L (ref 27–131)

## 2023-01-27 NOTE — Progress Notes (Signed)
Established Patient Office Visit  Subjective:      CC:  Chief Complaint  Patient presents with   Medical Management of Chronic Issues    HPI: Alisha Edwards is a 32 y.o. female presenting on 01/27/2023 for Medical Management of Chronic Issues . Anxiety with depression: last visit 11/26 stopped cymbalta 30 mg once daily and we stopped sertraline. She is seeing therapy as well, has since established. She states she just started taking the cymbalta consistently ever since last week.   She states she is ready to return to her job, however she does not think she can handle going back full time. She would like to go back at this time part time, at about 5 hours a day. She would like to work from 1030 to 330 pm. She does also request for intermittent leave as well for if she qualifies for FMLA. She was given FMLA for treatment time from 10/28 to 02/11/23.   Constipation: ongoing. Saw doctor at [facility] 12/11, XR showed moderate constipation. Tried mag citrate without any relief. No nausea and or vomiting. Constipation is not abn to her. No blood in stool. Abdominal pain and decreased appetite.  She does at times have issues with hemorrhoids. She ate a hot dog about one hour ago.    Social history:  Relevant past medical, surgical, family and social history reviewed and updated as indicated. Interim medical history since our last visit reviewed.  Allergies and medications reviewed and updated.  DATA REVIEWED: CHART IN EPIC     ROS: Negative unless specifically indicated above in HPI.    Current Outpatient Medications:    Cholecalciferol 1.25 MG (50000 UT) TABS, Take 1 tablet by mouth once a week., Disp: 8 tablet, Rfl: 0   DULoxetine (CYMBALTA) 30 MG capsule, Take 1 capsule (30 mg total) by mouth daily., Disp: 90 capsule, Rfl: 0   tiZANidine (ZANAFLEX) 4 MG tablet, Take 1 tablet (4 mg total) by mouth every 6 (six) hours as needed for muscle spasms., Disp: 30 tablet, Rfl: 0    valACYclovir (VALTREX) 500 MG tablet, Take 1 tablet (500 mg total) by mouth daily., Disp: 90 tablet, Rfl: 0   lisdexamfetamine (VYVANSE) 10 MG capsule, Take 1 capsule (10 mg total) by mouth daily before breakfast., Disp: 30 capsule, Rfl: 0   lisdexamfetamine (VYVANSE) 10 MG capsule, Take 1 capsule (10 mg total) by mouth daily before breakfast., Disp: 30 capsule, Rfl: 0      Objective:    BP 100/74 (BP Location: Right Arm, Patient Position: Sitting, Cuff Size: Normal)   Pulse 72   Temp 98.3 F (36.8 C) (Temporal)   Ht 5\' 4"  (1.626 m)   Wt 129 lb (58.5 kg)   SpO2 97%   BMI 22.14 kg/m   Wt Readings from Last 3 Encounters:  02/04/23 132 lb (59.9 kg)  01/27/23 129 lb (58.5 kg)  01/06/23 132 lb 7.2 oz (60.1 kg)    Physical Exam Constitutional:      General: She is not in acute distress.    Appearance: Normal appearance. She is normal weight. She is not ill-appearing, toxic-appearing or diaphoretic.  HENT:     Head: Normocephalic.  Cardiovascular:     Rate and Rhythm: Normal rate.  Pulmonary:     Effort: Pulmonary effort is normal.  Abdominal:     General: Abdomen is flat. Bowel sounds are decreased.     Palpations: Abdomen is soft.     Tenderness: There is abdominal tenderness  in the right lower quadrant, left upper quadrant and left lower quadrant.     Hernia: No hernia is present.  Musculoskeletal:        General: Normal range of motion.  Neurological:     General: No focal deficit present.     Mental Status: She is alert and oriented to person, place, and time. Mental status is at baseline.  Psychiatric:        Attention and Perception: Attention and perception normal. She is attentive. She does not perceive auditory or visual hallucinations.        Mood and Affect: Mood normal.        Speech: Speech normal.        Behavior: Behavior normal. Behavior is cooperative.        Thought Content: Thought content normal. Thought content does not include homicidal or suicidal  ideation. Thought content does not include homicidal or suicidal plan.        Cognition and Memory: Cognition and memory normal.        Judgment: Judgment normal.           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 At this time ready to go back at part time work as slight improvement with therapy and also medication therapy, but desires to work 1030-330 pm. She is going to continue with therapy and cymbalta 30 mg once daily.   We will start return to work status as of date 1/6 (she wants to extend from 1/1 due to return from work after holidays the call volume will be at its max and this causes a lot of stress for her to think about, feels that jan 6th will be more manageable) at part time status on her FMLA form and also give intermittent for one time a week prn flare ups.    Constipation, unspecified constipation type Assessment & Plan: With acute abd pain Stat ct abd pelvis  Ddx obstruction, diverticulitis, appendicitis Recommendation for miralax daily, increased water intake, mag citrate, fleet enema. If n/v fever worsening abd pain need to immediately go the hospital Cbc cmp amylase lipase ordered pending results Urine culture to r/o UTI  Once obstruction or acute finding r/o can consider trulance and or linzess  Also consider GI referral  Orders: -     CT ABDOMEN PELVIS W CONTRAST; Future -     Amylase -     CBC with Differential/Platelet -     Lipase  Lower abdominal pain -     Urinalysis w microscopic + reflex cultur -     Amylase -     CBC with Differential/Platelet -     Lipase  Abdominal pain, acute, left upper quadrant -     Urinalysis w microscopic + reflex cultur -      Amylase -     CBC with Differential/Platelet -     Lipase  Other orders -     REFLEXIVE URINE CULTURE     Return in about 1 month (around 02/27/2023) for follow up FMLA, f/u cholesterol.  Mort Sawyers, MSN, APRN, FNP-C [facility]

## 2023-01-27 NOTE — Assessment & Plan Note (Addendum)
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 At this time ready to go back at part time work as slight improvement with therapy and also medication therapy, but desires to work 1030-330 pm. She is going to continue with therapy and cymbalta 30 mg once daily.   We will start return to work status as of date 1/6 (she wants to extend from 1/1 due to return from work after holidays the call volume will be at its max and this causes a lot of stress for her to think about, feels that jan 6th will be more manageable) at part time status on her FMLA form and also give intermittent for one time a week prn flare ups.

## 2023-01-27 NOTE — Patient Instructions (Signed)
 Managing Anxiety, Adult  After being diagnosed with anxiety, you may be relieved to know why you have felt or behaved a certain way. You may also feel overwhelmed about the treatment ahead and what it will mean for your life. With care and support, you can manage your anxiety.  How to manage lifestyle changes  Understanding the difference between stress and anxiety  Although stress can play a role in anxiety, it is not the same as anxiety. Stress is your body's reaction to life changes and events, both good and bad. Stress is often caused by something external, such as a deadline, test, or competition. It normally goes away after the event has ended and will last just a few hours. But, stress can be ongoing and can lead to more than just stress.  Anxiety is caused by something internal, such as imagining a terrible outcome or worrying that something will go wrong that will greatly upset you. Anxiety often does not go away even after the event is over, and it can become a long-term (chronic) worry.  Lowering stress and anxiety    Talk with your health care provider or a counselor to learn more about lowering anxiety and stress. They may suggest tension-reduction techniques, such as:  Music. Spend time creating or listening to music that you enjoy and that inspires you.  Mindfulness-based meditation. Practice being aware of your normal breaths while not trying to control your breathing. It can be done while sitting or walking.  Centering prayer. Focus on a word, phrase, or sacred image that means something to you and brings you peace.  Deep breathing. Expand your stomach and inhale slowly through your nose. Hold your breath for 3-5 seconds. Then breathe out slowly, letting your stomach muscles relax.  Self-talk. Learn to notice and spot thought patterns that lead to anxiety reactions. Change those patterns to thoughts that feel peaceful.  Muscle relaxation. Take time to tense muscles and then relax them.  Choose a  tension-reduction technique that fits your lifestyle and personality. These techniques take time and practice. Set aside 5-15 minutes a day to do them. Specialized therapists can offer counseling and training in these techniques. The training to help with anxiety may be covered by some insurance plans.  Other things you can do to manage stress and anxiety include:  Keeping a stress diary. This can help you learn what triggers your reaction and then learn ways to manage your response.  Thinking about how you react to certain situations. You may not be able to control everything, but you can control your response.  Making time for activities that help you relax and not feeling guilty about spending your time in this way.  Doing visual imagery. This involves imagining or creating mental pictures to help you relax.  Practicing yoga. Through yoga poses, you can lower tension and relax.     Medicines  Medicines for anxiety include:  Antidepressant medicines. These are usually prescribed for long-term daily control.  Anti-anxiety medicines. These may be added in severe cases, especially when panic attacks occur.  When used together, medicines, psychotherapy, and tension-reduction techniques may be the most effective treatment.  Relationships  Relationships can play a big part in helping you recover. Spend more time connecting with trusted friends and family members. Think about going to couples counseling if you have a partner, taking family education classes, or going to family therapy. Therapy can help you and others better understand your anxiety.  How to recognize changes in  your anxiety  Everyone responds differently to treatment for anxiety. Recovery from anxiety happens when symptoms lessen and stop interfering with your daily life at home or work. This may mean that you will start to:  Have better concentration and focus. Worry will interfere less in your daily thinking.  Sleep better.  Be less irritable.  Have  more energy.  Have improved memory.  Try to recognize when your condition is getting worse. Contact your provider if your symptoms interfere with home or work and you feel like your condition is not improving.  Follow these instructions at home:  Activity  Exercise. Adults should:  Exercise for at least 150 minutes each week. The exercise should increase your heart rate and make you sweat (moderate-intensity exercise).  Do strengthening exercises at least twice a week.  Get the right amount and quality of sleep. Most adults need 7-9 hours of sleep each night.  Lifestyle    Eat a healthy diet that includes plenty of vegetables, fruits, whole grains, low-fat dairy products, and lean protein.  Do not eat a lot of foods that are high in fats, added sugars, or salt (sodium).  Make choices that simplify your life.  Do not use any products that contain nicotine or tobacco. These products include cigarettes, chewing tobacco, and vaping devices, such as e-cigarettes. If you need help quitting, ask your provider.  Avoid caffeine, alcohol, and certain over-the-counter cold medicines. These may make you feel worse. Ask your pharmacist which medicines to avoid.  General instructions  Take over-the-counter and prescription medicines only as told by your provider.  Keep all follow-up visits. This is to make sure you are managing your anxiety well or if you need more support.  Where to find support  You can get help and support from:  Self-help groups.  Online and [employer].  A trusted spiritual leader.  Couples counseling.  Family education classes.  Family therapy.  Where to find more information  You may find that joining a support group helps you deal with your anxiety. The following sources can help you find counselors or support groups near you:  Mental Health America: mentalhealthamerica.net  Anxiety and Depression Association of [place] (ADAA): adaa.org  [employer] Mental Illness (NAMI):  nami.org  Contact a health care provider if:  You have a hard time staying focused or finishing tasks.  You spend many hours a day feeling worried about everyday life.  You are very tired because you cannot stop worrying.  You start to have headaches or often feel tense.  You have chronic nausea or diarrhea.  Get help right away if:  Your heart feels like it is racing.  You have shortness of breath.  You have thoughts of hurting yourself or others.  Get help right away if you feel like you may hurt yourself or others, or have thoughts about taking your own life. Go to your nearest emergency room or:  Call 911.  Call the National Suicide Prevention Lifeline at 417-380-0019 or 988. This is open 24 hours a day.  Text the Crisis Text Line at 4151481703.  This information is not intended to replace advice given to you by your health care provider. Make sure you discuss any questions you have with your health care provider.  Document Revised: 11/05/2021 Document Reviewed: 05/20/2020  Elsevier Patient Education  2024 [employer].

## 2023-01-28 ENCOUNTER — Ambulatory Visit (HOSPITAL_COMMUNITY): Payer: Medicaid Other

## 2023-01-28 LAB — URINALYSIS W MICROSCOPIC + REFLEX CULTURE
Bacteria, UA: NONE SEEN /[HPF]
Bilirubin Urine: NEGATIVE
Glucose, UA: NEGATIVE
Hgb urine dipstick: NEGATIVE
Hyaline Cast: NONE SEEN /[LPF]
Leukocyte Esterase: NEGATIVE
Nitrites, Initial: NEGATIVE
Protein, ur: NEGATIVE
RBC / HPF: NONE SEEN /[HPF] (ref 0–2)
Specific Gravity, Urine: 1.034 (ref 1.001–1.035)
WBC, UA: NONE SEEN /[HPF] (ref 0–5)
pH: 6.5 (ref 5.0–8.0)

## 2023-01-28 LAB — NO CULTURE INDICATED

## 2023-01-29 ENCOUNTER — Ambulatory Visit (HOSPITAL_COMMUNITY)
Admission: RE | Admit: 2023-01-29 | Discharge: 2023-01-29 | Disposition: A | Discharge status: Home / Self Care | Payer: 59 | Source: Ambulatory Visit | Attending: Family | Admitting: Family

## 2023-01-29 ENCOUNTER — Other Ambulatory Visit: Payer: Self-pay | Admitting: Family

## 2023-01-29 ENCOUNTER — Telehealth: Payer: Self-pay

## 2023-01-29 DIAGNOSIS — K5900 Constipation, unspecified: Secondary | ICD-10-CM | POA: Insufficient documentation

## 2023-01-29 DIAGNOSIS — D72819 Decreased white blood cell count, unspecified: Secondary | ICD-10-CM

## 2023-01-29 DIAGNOSIS — D72828 Other elevated white blood cell count: Secondary | ICD-10-CM

## 2023-01-29 MED ORDER — IOHEXOL 300 MG/ML  SOLN
100.0000 mL | Freq: Once | INTRAMUSCULAR | Status: AC | PRN
  Administered 2023-01-29: 100 mL via INTRAVENOUS

## 2023-01-29 MED ORDER — IOHEXOL 300 MG/ML  SOLN
30.0000 mL | Freq: Once | INTRAMUSCULAR | Status: AC | PRN
Start: 2023-01-29 — End: 2023-01-29
  Administered 2023-01-29: 30 mL via ORAL

## 2023-01-29 NOTE — Telephone Encounter (Signed)
Radiology called regarding CT results for patient. Scan is uploaded to patients chart.   IMPRESSION: No acute findings in the abdomen or pelvis. Specifically, no findings to explain the patient's history of abdominal pain.

## 2023-01-29 NOTE — Progress Notes (Signed)
Any chance we can add ana and sed rate? Orders in.

## 2023-01-29 NOTE — Progress Notes (Signed)
Any chance we can add ana and sed rate? Orders in.   Didn't mean to send to [facility].

## 2023-01-30 ENCOUNTER — Encounter: Payer: Self-pay | Admitting: Family

## 2023-01-30 DIAGNOSIS — K5904 Chronic idiopathic constipation: Secondary | ICD-10-CM

## 2023-02-02 NOTE — Telephone Encounter (Signed)
Noted  

## 2023-02-04 ENCOUNTER — Emergency Department (HOSPITAL_BASED_OUTPATIENT_CLINIC_OR_DEPARTMENT_OTHER): Payer: 59

## 2023-02-04 ENCOUNTER — Emergency Department (HOSPITAL_BASED_OUTPATIENT_CLINIC_OR_DEPARTMENT_OTHER)
Admission: EM | Admit: 2023-02-04 | Discharge: 2023-02-04 | Disposition: A | Discharge status: Home / Self Care | Payer: 59 | Attending: Emergency Medicine | Admitting: Emergency Medicine

## 2023-02-04 ENCOUNTER — Encounter (HOSPITAL_BASED_OUTPATIENT_CLINIC_OR_DEPARTMENT_OTHER): Payer: Self-pay

## 2023-02-04 ENCOUNTER — Other Ambulatory Visit: Payer: Self-pay

## 2023-02-04 DIAGNOSIS — K5900 Constipation, unspecified: Secondary | ICD-10-CM | POA: Insufficient documentation

## 2023-02-04 DIAGNOSIS — R1031 Right lower quadrant pain: Secondary | ICD-10-CM

## 2023-02-04 DIAGNOSIS — R350 Frequency of micturition: Secondary | ICD-10-CM | POA: Diagnosis not present

## 2023-02-04 DIAGNOSIS — Z91040 Latex allergy status: Secondary | ICD-10-CM | POA: Diagnosis not present

## 2023-02-04 DIAGNOSIS — Z1152 Encounter for screening for COVID-19: Secondary | ICD-10-CM | POA: Diagnosis not present

## 2023-02-04 LAB — RESP PANEL BY RT-PCR (RSV, FLU A&B, COVID)  RVPGX2
Influenza A by PCR: NEGATIVE
Influenza B by PCR: NEGATIVE
Resp Syncytial Virus by PCR: NEGATIVE
SARS Coronavirus 2 by RT PCR: NEGATIVE

## 2023-02-04 LAB — COMPREHENSIVE METABOLIC PANEL
ALT: 17 U/L (ref 0–44)
AST: 16 U/L (ref 15–41)
Albumin: 3.8 g/dL (ref 3.5–5.0)
Alkaline Phosphatase: 52 U/L (ref 38–126)
Anion gap: 8 (ref 5–15)
BUN: 19 mg/dL (ref 6–20)
CO2: 25 mmol/L (ref 22–32)
Calcium: 8.5 mg/dL — ABNORMAL LOW (ref 8.9–10.3)
Chloride: 101 mmol/L (ref 98–111)
Creatinine, Ser: 0.86 mg/dL (ref 0.44–1.00)
GFR, Estimated: >60 mL/min (ref >60)
Glucose, Bld: 89 mg/dL (ref 70–99)
Potassium: 3.5 mmol/L (ref 3.5–5.1)
Sodium: 134 mmol/L — ABNORMAL LOW (ref 135–145)
Total Bilirubin: 1.2 mg/dL — ABNORMAL HIGH (ref <1.2)
Total Protein: 7 g/dL (ref 6.5–8.1)

## 2023-02-04 LAB — URINALYSIS, W/ REFLEX TO CULTURE (INFECTION SUSPECTED)
Glucose, UA: NEGATIVE mg/dL
Ketones, ur: 15 mg/dL — AB
Leukocytes,Ua: NEGATIVE
Nitrite: NEGATIVE
Protein, ur: 30 mg/dL — AB
Specific Gravity, Urine: >=1.03 (ref 1.005–1.030)
pH: 6 (ref 5.0–8.0)

## 2023-02-04 LAB — PREGNANCY, URINE: Preg Test, Ur: NEGATIVE

## 2023-02-04 LAB — CBC WITH DIFFERENTIAL/PLATELET
Abs Immature Granulocytes: 0 10*3/uL (ref 0.00–0.07)
Basophils Absolute: 0 10*3/uL (ref 0.0–0.1)
Basophils Relative: 0 %
Eosinophils Absolute: 0 10*3/uL (ref 0.0–0.5)
Eosinophils Relative: 1 %
HCT: 39.4 % (ref 36.0–46.0)
Hemoglobin: 13.2 g/dL (ref 12.0–15.0)
Immature Granulocytes: 0 %
Lymphocytes Relative: 17 %
Lymphs Abs: 0.8 10*3/uL (ref 0.7–4.0)
MCH: 30.1 pg (ref 26.0–34.0)
MCHC: 33.5 g/dL (ref 30.0–36.0)
MCV: 90 fL (ref 80.0–100.0)
Monocytes Absolute: 0.3 10*3/uL (ref 0.1–1.0)
Monocytes Relative: 6 %
Neutro Abs: 3.5 10*3/uL (ref 1.7–7.7)
Neutrophils Relative %: 76 %
Platelets: 230 10*3/uL (ref 150–400)
RBC: 4.38 MIL/uL (ref 3.87–5.11)
RDW: 12.3 % (ref 11.5–15.5)
WBC: 4.6 10*3/uL (ref 4.0–10.5)
nRBC: 0 % (ref 0.0–0.2)

## 2023-02-04 LAB — LIPASE, BLOOD: Lipase: 30 U/L (ref 11–51)

## 2023-02-04 MED ORDER — KETOROLAC TROMETHAMINE 15 MG/ML IJ SOLN
15.0000 mg | Freq: Once | INTRAMUSCULAR | Status: AC
  Administered 2023-02-04: 15 mg via INTRAVENOUS
  Filled 2023-02-04: qty 1

## 2023-02-04 MED ORDER — LACTATED RINGERS IV BOLUS
1000.0000 mL | Freq: Once | INTRAVENOUS | Status: AC
  Administered 2023-02-04: 1000 mL via INTRAVENOUS

## 2023-02-04 MED ORDER — FLEET ENEMA RE ENEM
1.0000 | ENEMA | Freq: Once | RECTAL | Status: AC
  Administered 2023-02-04: 1 via RECTAL
  Filled 2023-02-04: qty 1

## 2023-02-04 MED ORDER — ONDANSETRON HCL 4 MG/2ML IJ SOLN
4.0000 mg | Freq: Three times a day (TID) | INTRAMUSCULAR | Status: DC | PRN
  Administered 2023-02-04: 4 mg via INTRAVENOUS
  Filled 2023-02-04: qty 2

## 2023-02-04 MED ORDER — FENTANYL CITRATE PF 50 MCG/ML IJ SOSY: 50.0000 ug | PREFILLED_SYRINGE | Freq: Once | INTRAMUSCULAR | Status: DC

## 2023-02-04 MED ORDER — IOHEXOL 300 MG/ML  SOLN
100.0000 mL | Freq: Once | INTRAMUSCULAR | Status: AC | PRN
  Administered 2023-02-04: 100 mL via INTRAVENOUS

## 2023-02-04 NOTE — ED Notes (Signed)
Pt states she has issues with constipation.  Gave some tips on being on a bowel maintenance with increased fiber and fluids daily.

## 2023-02-04 NOTE — ED Notes (Signed)
Pt states she had good results from her enema

## 2023-02-04 NOTE — Discharge Instructions (Addendum)
Can try an at-home enema for your constipation.  Your laboratory evaluation and CT imaging was overall reassuring today.  Your symptoms improved with a successful bowel movement after an enema.  For a bowel regimen in the event of worsening symptoms of constipation, recommend 4 capfuls of MiraLAX in a 32 ounce Gatorade or rough equivalent, drink over 4 hours and can repeat the following day.  Follow-up with your primary care provider for longer-term management.

## 2023-02-04 NOTE — Assessment & Plan Note (Signed)
With acute abd pain Stat ct abd pelvis  Ddx obstruction, diverticulitis, appendicitis Recommendation for miralax daily, increased water intake, mag citrate, fleet enema. If n/v fever worsening abd pain need to immediately go the hospital Cbc cmp amylase lipase ordered pending results Urine culture to r/o UTI  Once obstruction or acute finding r/o can consider trulance and or linzess  Also consider GI referral

## 2023-02-04 NOTE — ED Triage Notes (Signed)
A week ago was having RLQ pain radiating to flank. Went to PCP and did CT and saw constipation. Taking laxative. Hx of constipation. States pain has slightly improved. Now c/o lower abdominal pressure and urinary frequency. Yesterday with bodyaches, chills, right side tenderness. Last BM 3-4 days ago

## 2023-02-04 NOTE — ED Provider Notes (Addendum)
[place] [facility] Provider Note   CSN: [id number] Arrival date & time: 02/04/23  0751     History  Chief Complaint  Patient presents with   Abdominal Pain    Alisha Edwards is a 32 y.o. female.   Abdominal Pain    32 year old female with medical history significant for HSV infection, chlamydia infection, anxiety, depression, genital warts, recent diagnosis of constipation after CT imaging who presents to the emergency department with abdominal pain.  The patient states that she has been taking home MiraLAX.  She initially managed her constipation with MiraLAX and increased oral intake but states that over the last 3 to 4 days she has not had a bowel movement.  She is passing gas.  She endorses new right lower quadrant abdominal pain and pressure.  She also endorses urinary frequency and suprapubic pressure.  She endorses mild chills.  Home Medications Prior to Admission medications   Medication Sig Start Date End Date Taking? Authorizing Provider  Cholecalciferol 1.25 MG (50000 UT) TABS Take 1 tablet by mouth once a week. 01/07/23   Mort Sawyers, FNP  DULoxetine (CYMBALTA) 30 MG capsule Take 1 capsule (30 mg total) by mouth daily. 01/06/23   Mort Sawyers, FNP  lisdexamfetamine (VYVANSE) 10 MG capsule Take 1 capsule (10 mg total) by mouth daily before breakfast. 11/20/22 12/20/22  Mort Sawyers, FNP  lisdexamfetamine (VYVANSE) 10 MG capsule Take 1 capsule (10 mg total) by mouth daily before breakfast. 12/20/22 01/19/23  Mort Sawyers, FNP  tiZANidine (ZANAFLEX) 4 MG tablet Take 1 tablet (4 mg total) by mouth every 6 (six) hours as needed for muscle spasms. 01/06/23   Mort Sawyers, FNP  valACYclovir (VALTREX) 500 MG tablet Take 1 tablet (500 mg total) by mouth daily. 10/15/22   Mort Sawyers, FNP      Allergies    Sertraline, Shellfish allergy, and Latex    Review of Systems   Review of Systems  Gastrointestinal:  Positive for  abdominal pain.    Physical Exam Updated Vital Signs BP 102/73   Pulse 73   Temp 98.7 F (37.1 C) (Oral)   Resp 16   Ht 5\' 4"  (1.626 m)   Wt 59.9 kg   LMP 01/28/2023 (Approximate)   SpO2 100%   BMI 22.66 kg/m  Physical Exam Vitals and nursing note reviewed.  Constitutional:      General: She is not in acute distress.    Appearance: She is well-developed.  HENT:     Head: Normocephalic and atraumatic.  Eyes:     Conjunctiva/sclera: Conjunctivae normal.  Cardiovascular:     Rate and Rhythm: Normal rate and regular rhythm.     Heart sounds: No murmur heard. Pulmonary:     Effort: Pulmonary effort is normal. No respiratory distress.     Breath sounds: Normal breath sounds.  Abdominal:     Palpations: Abdomen is soft.     Tenderness: There is abdominal tenderness in the right lower quadrant. There is no guarding.  Musculoskeletal:        General: No swelling.     Cervical back: Neck supple.  Skin:    General: Skin is warm and dry.     Capillary Refill: Capillary refill takes less than 2 seconds.  Neurological:     Mental Status: She is alert.  Psychiatric:        Mood and Affect: Mood normal.     ED Results / Procedures / Treatments   Labs (  all labs ordered are listed, but only abnormal results are displayed) Labs Reviewed  COMPREHENSIVE METABOLIC PANEL - Abnormal; Notable for the following components:      Result Value   Sodium 134 (*)    Calcium 8.5 (*)    Total Bilirubin 1.2 (*)    All other components within normal limits  URINALYSIS, W/ REFLEX TO CULTURE (INFECTION SUSPECTED) - Abnormal; Notable for the following components:   Hgb urine dipstick TRACE (*)    Bilirubin Urine SMALL (*)    Ketones, ur 15 (*)    Protein, ur 30 (*)    Bacteria, UA RARE (*)    All other components within normal limits  RESP PANEL BY RT-PCR (RSV, FLU A&B, COVID)  RVPGX2  CBC WITH DIFFERENTIAL/PLATELET  LIPASE, BLOOD  PREGNANCY, URINE    EKG None  Radiology CT ABDOMEN  PELVIS W CONTRAST Result Date: 02/04/2023 CLINICAL DATA:  Right lower quadrant abdominal pain EXAM: CT ABDOMEN AND PELVIS WITH CONTRAST TECHNIQUE: Multidetector CT imaging of the abdomen and pelvis was performed using the standard protocol following bolus administration of intravenous contrast. RADIATION DOSE REDUCTION: This exam was performed according to the departmental dose-optimization program which includes automated exposure control, adjustment of the mA and/or kV according to patient size and/or use of iterative reconstruction technique. CONTRAST:  OMNIPAQUE IOHEXOL 300 MG/ML  SOLN COMPARISON:  01/29/2023 FINDINGS: Lower chest: No acute abnormality. Hepatobiliary: No solid liver abnormality is seen. No gallstones, gallbladder wall thickening, or biliary dilatation. Pancreas: Unremarkable. No pancreatic ductal dilatation or surrounding inflammatory changes. Spleen: Normal in size without significant abnormality. Adrenals/Urinary Tract: Adrenal glands are unremarkable. Kidneys are normal, without renal calculi, solid lesion, or hydronephrosis. Bladder is unremarkable. Stomach/Bowel: Stomach is within normal limits. Appendix not clearly visualized. No evidence of bowel wall thickening, distention, or inflammatory changes. Vascular/Lymphatic: No significant vascular findings are present. No enlarged abdominal or pelvic lymph nodes. Reproductive: No mass or other significant abnormality. Other: No abdominal wall hernia or abnormality. No ascites. Musculoskeletal: No acute or significant osseous findings. IMPRESSION: 1. No acute CT findings of the abdomen or pelvis to explain right lower quadrant pain. 2. Appendix not clearly visualized. No secondary findings of inflammation in the right lower quadrant. Electronically Signed   By: Jearld Lesch M.D.   On: 02/04/2023 10:31    Procedures Procedures    Medications Ordered in ED Medications  ondansetron (ZOFRAN) injection 4 mg (4 mg Intravenous Given  02/04/23 0908)  lactated ringers bolus 1,000 mL ( Intravenous Stopped 02/04/23 0928)  ketorolac (TORADOL) 15 MG/ML injection 15 mg (15 mg Intravenous Given 02/04/23 0908)  iohexol (OMNIPAQUE) 300 MG/ML solution 100 mL (100 mLs Intravenous Contrast Given 02/04/23 1011)  sodium phosphate (FLEET) enema 1 enema (1 enema Rectal Given 02/04/23 1134)    ED Course/ Medical Decision Making/ A&P                                 Medical Decision Making Amount and/or Complexity of Data Reviewed Labs: ordered. Radiology: ordered.  Risk OTC drugs. Prescription drug management.     32 year old female with medical history significant for HSV infection, chlamydia infection, anxiety, depression, genital warts, recent diagnosis of constipation after CT imaging who presents to the emergency department with abdominal pain.  The patient states that she has been taking home MiraLAX.  She initially managed her constipation with MiraLAX and increased oral intake but states that over the last  3 to 4 days she has not had a bowel movement.  She is passing gas.  She endorses new right lower quadrant abdominal pain and pressure.  She also endorses urinary frequency and suprapubic pressure.  She endorses mild chills.  She is not worried about STIs.  On arrival, the patient was afebrile, vitally stable, not tachycardic or tachypneic, saturating well on room air.  Sinus rhythm noted on cardiac telemetry.  Physical exam revealed right lower quadrant tenderness.  Given the patient's recent history, concern primarily for constipation and associated abdominal pain and spasm.  Considered appendicitis given the patient's complaint of chills.  Considered viral syndrome, considered UTI/pyelonephritis.  Considered diverticulitis.  The access was obtained and the patient was administered IV fluid bolus, IV Toradol and IV Zofran.  Laboratory evaluation revealed trace hemoglobin, negative for UTI, CBC without a leukocytosis or  anemia, urine pregnancy negative, lipase normal, CMP without significant electrolyte abnormality, normal renal and liver function.  CT abdomen pelvis: IMPRESSION:  1. No acute CT findings of the abdomen or pelvis to explain right  lower quadrant pain.  2. Appendix not clearly visualized. No secondary findings of  inflammation in the right lower quadrant.    Low concern for appendicitis given labs and imaging.  Patient was symptomatically improved on repeat assessment, suspect likely ongoing constipation given lack of bowel movement over the last 4 days and recent history.  Patient was administered fleets enema with good result and successful bowel movement.  Patient overall asymptomatic and well-appearing on repeat evaluation, advised continued outpatient bowel regimen, follow-up outpatient with a PCP for longer-term management.  Stable for discharge with return precautions provided.  Final Clinical Impression(s) / ED Diagnoses Final diagnoses:  Right lower quadrant abdominal pain  Constipation, unspecified constipation type    Rx / DC Orders ED Discharge Orders     None         Ernie Avena, MD 02/04/23 1217    Ernie Avena, MD 02/04/23 1218

## 2023-02-05 ENCOUNTER — Encounter: Payer: Self-pay | Admitting: Family

## 2023-02-05 ENCOUNTER — Other Ambulatory Visit: Payer: Self-pay | Admitting: Family

## 2023-02-05 DIAGNOSIS — K5904 Chronic idiopathic constipation: Secondary | ICD-10-CM

## 2023-02-05 MED ORDER — LINACLOTIDE 290 MCG PO CAPS: 290.0000 ug | ORAL_CAPSULE | Freq: Every day | ORAL | 0 refills | Status: DC

## 2023-02-06 NOTE — Addendum Note (Signed)
Addended by: Mort Sawyers on: 02/06/2023 11:44 AM   Modules accepted: Orders

## 2023-02-09 ENCOUNTER — Encounter: Payer: Self-pay | Admitting: Family

## 2023-02-13 NOTE — Telephone Encounter (Signed)
 Spoke with pt over the phone. She wanted to discuss something that was documented on the forms she picked up. Nothing further was needed.

## 2023-02-19 ENCOUNTER — Telehealth: Payer: Self-pay | Admitting: Family

## 2023-02-19 NOTE — Telephone Encounter (Signed)
 Please let pt know that she will need to come in for a new opioid contract which unfortunately is a visit to change scheduled prescriptions.

## 2023-02-19 NOTE — Telephone Encounter (Signed)
 Lillia Abed you can send this back to me I sent a mychart message instead.

## 2023-02-19 NOTE — Telephone Encounter (Signed)
 Do you know the part time return to work date? And then the anticipated full time to return to work date?

## 2023-02-19 NOTE — Telephone Encounter (Signed)
 Spoke with pt and she is aware of Tabitha's response. OV has been scheduled for 02/23/23 at 1240.

## 2023-02-19 NOTE — Telephone Encounter (Signed)
 Forms have been faxed along with records. Copies have been made for her chart. Original forms have been placed up front for pick up. Pt has been made aware of this information. While speaking her, she wanted to know if Tabitha could switch her Vyvanse  to Ritalin . States that she has taken this in the past and has done well. She feels like Vyvanse  is not helping with her being on edge like it used to. Pt's mother got on the line with me and the pt and she stated that for the past few months the pt has been very short tempered and not herself.  Tabitha - please advise on med change. Thank you!

## 2023-02-19 NOTE — Telephone Encounter (Signed)
Completed paperwork in outbox 

## 2023-02-23 ENCOUNTER — Encounter: Payer: Self-pay | Admitting: Family

## 2023-02-23 ENCOUNTER — Ambulatory Visit (INDEPENDENT_AMBULATORY_CARE_PROVIDER_SITE_OTHER): Payer: 59 | Admitting: Family

## 2023-02-23 VITALS — BP 116/78 | HR 88 | Temp 98.1°F | Ht 64.0 in

## 2023-02-23 DIAGNOSIS — F988 Other specified behavioral and emotional disorders with onset usually occurring in childhood and adolescence: Secondary | ICD-10-CM

## 2023-02-23 DIAGNOSIS — F418 Other specified anxiety disorders: Secondary | ICD-10-CM

## 2023-02-23 DIAGNOSIS — Z79899 Other long term (current) drug therapy: Secondary | ICD-10-CM | POA: Diagnosis not present

## 2023-02-23 MED ORDER — METHYLPHENIDATE HCL ER (LA) 10 MG PO CP24: 10.0000 mg | ORAL_CAPSULE | Freq: Every day | ORAL | 0 refills | Status: DC

## 2023-02-23 NOTE — Progress Notes (Signed)
 Established Patient Office Visit  Subjective:   Patient ID: Alisha Edwards, female    DOB: [birth year]  Age: 33 y.o. MRN: [id number]  CC:  Chief Complaint  Patient presents with   ADHD    Has noticed a change in moods with medication      HPI: Alisha Edwards is a 33 y.o. female presenting on 02/23/2023 for ADHD (Has noticed a change in moods with medication  )   ADD, wants to switch from vyvanse  to ritalin . She was on this in the past and feels she did much better. Mood has been worsening since she has changed to vyvanse , she feels she is more agitated and short tempered on the vyvanse . She states only started consistently taking the vyvanse  since December.   The only reason she had stopped ritalin  in the past was because of availability she couldn't find it anywhere.   Anxiety depression: doing well on cymbalta  30 mg once daily. She states she has been feeling better, and she is excited about going back to work rather than worried. She thinks she feels better knowing that she is going back to work at part time status. She is still looking into changing her work place but for now she is ready to go back to her current job.      ROS: Negative unless specifically indicated above in HPI.   Relevant past medical history reviewed and updated as indicated.   Allergies and medications reviewed and updated.   Current Outpatient Medications:    Cholecalciferol  1.25 MG (50000 UT) TABS, Take 1 tablet by mouth once a week., Disp: 8 tablet, Rfl: 0   DULoxetine  (CYMBALTA ) 30 MG capsule, Take 1 capsule (30 mg total) by mouth daily., Disp: 90 capsule, Rfl: 0   linaclotide  (LINZESS ) 290 MCG CAPS capsule, Take 1 capsule (290 mcg total) by mouth daily before breakfast., Disp: 90 capsule, Rfl: 0   methylphenidate  (RITALIN  LA) 10 MG 24 hr capsule, Take 1 capsule (10 mg total) by mouth daily., Disp: 30 capsule, Rfl: 0   tiZANidine  (ZANAFLEX ) 4 MG tablet, Take 1 tablet (4 mg total) by mouth every  6 (six) hours as needed for muscle spasms., Disp: 30 tablet, Rfl: 0   valACYclovir  (VALTREX ) 500 MG tablet, Take 1 tablet (500 mg total) by mouth daily., Disp: 90 tablet, Rfl: 0  Allergies  Allergen Reactions   Sertraline  Other (See Comments)    Zombie like   Shellfish Allergy Itching and Swelling    Mouth swells and itches   Latex Rash    Objective:   BP 116/78   Pulse 88   Temp 98.1 F (36.7 C) (Temporal)   Ht 5' 4 (1.626 m)   LMP 01/28/2023 (Approximate)   SpO2 97%   BMI 22.66 kg/m    Physical Exam Constitutional:      General: She is not in acute distress.    Appearance: Normal appearance. She is normal weight. She is not ill-appearing, toxic-appearing or diaphoretic.  HENT:     Head: Normocephalic.  Cardiovascular:     Rate and Rhythm: Normal rate and regular rhythm.  Pulmonary:     Effort: Pulmonary effort is normal.  Musculoskeletal:        General: Normal range of motion.  Neurological:     General: No focal deficit present.     Mental Status: She is alert and oriented to person, place, and time. Mental status is at baseline.  Psychiatric:  Mood and Affect: Mood normal.        Behavior: Behavior normal.        Thought Content: Thought content normal.        Judgment: Judgment normal.     Assessment & Plan:  Attention deficit disorder (ADD) without hyperactivity Assessment & Plan: Stop vyvanse  Start ritalin  LA 10 mg tolerating well before  Pdmp reviewed UDS ordered today   Orders: -     Methylphenidate  HCl ER (LA); Take 1 capsule (10 mg total) by mouth daily.  Dispense: 30 capsule; Refill: 0  Anxiety with depression  High risk medication use -     DRUG MONITORING, PANEL 8 WITH CONFIRMATION, URINE     Follow up plan: Return in about 3 months (around 05/24/2023).  Ginger Patrick, FNP

## 2023-02-23 NOTE — Assessment & Plan Note (Signed)
 Stop vyvanse Start ritalin LA 10 mg tolerating well before  Pdmp reviewed UDS ordered today

## 2023-02-24 LAB — DRUG MONITORING, PANEL 8 WITH CONFIRMATION, URINE
6 Acetylmorphine: NEGATIVE ng/mL (ref <10)
Alcohol Metabolites: NEGATIVE ng/mL (ref <500)
Amphetamines: NEGATIVE ng/mL (ref <500)
Benzodiazepines: NEGATIVE ng/mL (ref <100)
Buprenorphine, Urine: NEGATIVE ng/mL (ref <5)
Cocaine Metabolite: NEGATIVE ng/mL (ref <150)
Creatinine: >300 mg/dL (ref >=20.0)
MDMA: NEGATIVE ng/mL (ref <500)
Marijuana Metabolite: NEGATIVE ng/mL (ref <20)
Opiates: NEGATIVE ng/mL (ref <100)
Oxidant: NEGATIVE ug/mL (ref <200)
Oxycodone: NEGATIVE ng/mL (ref <100)
pH: 6.9 (ref 4.5–9.0)

## 2023-02-24 LAB — DM TEMPLATE

## 2023-02-27 ENCOUNTER — Encounter: Payer: Self-pay | Admitting: Family

## 2023-02-27 DIAGNOSIS — F418 Other specified anxiety disorders: Secondary | ICD-10-CM

## 2023-03-02 NOTE — Telephone Encounter (Signed)
Can you see if this was in the first initial paperwork from back in the beginning of [facility]ml[facility]

## 2023-03-03 ENCOUNTER — Ambulatory Visit: Payer: Medicaid Other | Admitting: Psychology

## 2023-03-03 MED ORDER — PROPRANOLOL HCL 10 MG PO TABS: ORAL_TABLET | ORAL | 0 refills | Status: DC

## 2023-03-03 NOTE — Addendum Note (Signed)
Addended by: Mort Sawyers on: 03/03/2023 04:15 PM   Modules accepted: Orders

## 2023-03-09 ENCOUNTER — Other Ambulatory Visit (HOSPITAL_COMMUNITY): Payer: Self-pay

## 2023-03-09 MED ORDER — METHYLPHENIDATE HCL ER (LA) 10 MG PO CP24
10.0000 mg | ORAL_CAPSULE | Freq: Every day | ORAL | 0 refills | Status: DC
  Filled 2023-03-09: qty 30, 30d supply, fill #0

## 2023-03-10 ENCOUNTER — Other Ambulatory Visit (HOSPITAL_COMMUNITY): Payer: Self-pay

## 2023-03-11 ENCOUNTER — Encounter: Payer: Self-pay | Admitting: Family

## 2023-03-16 NOTE — Telephone Encounter (Signed)
Completed paperwork in outbox 

## 2023-03-19 ENCOUNTER — Telehealth: Payer: Self-pay | Admitting: Family

## 2023-03-19 NOTE — Telephone Encounter (Signed)
 Copied from CRM [id number]. Topic: Medical Record Request - Other >> Mar 19, 2023  3:54 PM Corean SAUNDERS wrote: Reason for CRM:  John from [facility] needs this message to be sent to Ginger Patrick  This patient has returned to part time work on a rehabilitative plan. [employer] will need office notes from patients appointment on 03/31/23 as patient has filed for an extension for part time disability. [employer] states they will need the 2/18 office notes in order to approve the patients extension request for part time disability. Please fax the office notes to [phone] with attention to short term disability.   For reference claim [id number]

## 2023-03-24 NOTE — Telephone Encounter (Signed)
Yes virtual visit is fine

## 2023-03-24 NOTE — Telephone Encounter (Signed)
Noted will do.

## 2023-03-31 ENCOUNTER — Encounter: Payer: Self-pay | Admitting: Family

## 2023-03-31 ENCOUNTER — Telehealth (INDEPENDENT_AMBULATORY_CARE_PROVIDER_SITE_OTHER): Payer: Medicaid Other | Admitting: Family

## 2023-03-31 DIAGNOSIS — F418 Other specified anxiety disorders: Secondary | ICD-10-CM | POA: Diagnosis not present

## 2023-03-31 DIAGNOSIS — Z566 Other physical and mental strain related to work: Secondary | ICD-10-CM

## 2023-03-31 DIAGNOSIS — R4589 Other symptoms and signs involving emotional state: Secondary | ICD-10-CM

## 2023-03-31 DIAGNOSIS — F988 Other specified behavioral and emotional disorders with onset usually occurring in childhood and adolescence: Secondary | ICD-10-CM | POA: Diagnosis not present

## 2023-03-31 MED ORDER — METHYLPHENIDATE HCL 5 MG PO TABS: ORAL_TABLET | ORAL | 0 refills | Status: DC

## 2023-03-31 MED ORDER — METHYLPHENIDATE HCL ER (LA) 20 MG PO CP24: 20.0000 mg | ORAL_CAPSULE | ORAL | 0 refills | Status: DC

## 2023-03-31 NOTE — Assessment & Plan Note (Signed)
Improving but not currently stable Increase Ritalin LA to 20 mg in a.m. and add on Ritalin 5 mg around 1 PM daily. PDMP reviewed and RX sent into pharmacy.

## 2023-03-31 NOTE — Patient Instructions (Addendum)
Please call to see if you can get an appointment. Look for someone with CBT, DBT and or trauma therapy.   [facility] Phone # [phone] They have locations in [place], [place], [place], and [place]. They do take [employer] I know for sure and the [employer].   [facility] [phone]  [address] [place], [place], [place] [address]    [facility] [employer] [address] [place], [place] [address]   [phone]   [facility]- [phone]   [facility] [phone]   [place]- [phone]   If at any time you feel your needs are more urgent or you are concerned for your well being please see the below options:  For walk in options for mental health:   [facility] [address] in [place], [place]. Call our 24-hour HelpLine at [phone] or [phone] for immediate assistance for mental health and substance abuse issues.  And or walk into [facility] hospital ER.   [facility] [employer] [facility]: 1-800-SUICIDE  The [employer] Suicide Prevention Lifeline: 1-800-273-TALK  Regards,   Mort Sawyers FNP-C

## 2023-03-31 NOTE — Assessment & Plan Note (Signed)
Stable however not with real improvement.  Patient would like to hold off on increasing Cymbalta at this time.  Stressed importance of establishing with a psychiatrist and a therapist and patient is on board this.  Due to being incapacitated due to uncontrollable anxiety, panic attacks, and a feeling of being overwhelmed which resulted in her not being able to stay focused on work and complete tasks we are extending her short-term disability from February 10 to April 20, 2023.  This will allow her time to get established with a psychiatrist and therapist and work on a treatment plan

## 2023-03-31 NOTE — Progress Notes (Signed)
Virtual Visit via Video note  I connected with Alisha Edwards on 03/31/23 at home by video and verified that I am speaking with the correct person using two identifiers.The provider, Mort Sawyers, FNP is located in their office at time of visit.  I discussed the limitations, risks, security and privacy concerns of performing an evaluation and management service by video and the availability of in person appointments. I also discussed with the patient that there may be a patient responsible charge related to this service. The patient expressed understanding and agreed to proceed.  Subjective: PCP: Mort Sawyers, FNP  Chief Complaint  Patient presents with   Medical Management of Chronic Issues    Does not feel like Ritalin working as well Vyvanse.    HPI  Her grandma passed away a few years ago, she was told she was grieving. She states once she got the news, it was like 'she couldn't remember anything and her world just stopped'. She has seen a psychiatrist in the past and was told she had prolonged grieving. She has seen a therapist in the past as well but at the time was not overly helpful and she had some hesitancies with being able to open up and establish trust.   She has been on FMLA and STD with this most recent job, and is struggling to go back to work. Her schedule to RTW accomodated for full time remote work, working four days a week. She states however her supervisor was giving her resistance about this schedule, which increased her anxiety and made her feel very overwhelmed, and so she was unable to return to work, and has not returned since February 10th.   She wants to again get established with a psychiatrist and a therapist because work anxiety in general is making it hard for her to cope and also even consider being able to work again in the future.   She states this is more than just 'lack of focus with her ADD' and depression/anxiety, she just avoids anything  involving her job, and she is concerned over this. She does not sleep well at all as her mind just races when she has to work. Since being out of work she 'sleeps like a baby'.   She is currently taking ritalin and cymbalta.she would like to increase her ritalin as she needs increased focus and concentration mainly in the afternoon. She does not feel this is worsening her anxiety. She takes propanolol prn anxiety.         ROS: Per HPI  Current Outpatient Medications:    Cholecalciferol 1.25 MG (50000 UT) TABS, Take 1 tablet by mouth once a week., Disp: 8 tablet, Rfl: 0   DULoxetine (CYMBALTA) 30 MG capsule, Take 1 capsule (30 mg total) by mouth daily., Disp: 90 capsule, Rfl: 0   linaclotide (LINZESS) 290 MCG CAPS capsule, Take 1 capsule (290 mcg total) by mouth daily before breakfast., Disp: 90 capsule, Rfl: 0   propranolol (INDERAL) 10 MG tablet, Take one tablet 1 hour prior to anxiety provoking event prn, Disp: 20 tablet, Rfl: 0   tiZANidine (ZANAFLEX) 4 MG tablet, Take 1 tablet (4 mg total) by mouth every 6 (six) hours as needed for muscle spasms., Disp: 30 tablet, Rfl: 0   valACYclovir (VALTREX) 500 MG tablet, Take 1 tablet (500 mg total) by mouth daily., Disp: 90 tablet, Rfl: 0  Observations/Objective: Physical Exam Vitals reviewed.  Constitutional:      General: She is not in acute  distress.    Appearance: Normal appearance. She is not ill-appearing.  Pulmonary:     Effort: Pulmonary effort is normal.  Neurological:     General: No focal deficit present.     Mental Status: She is alert and oriented to person, place, and time.  Psychiatric:        Mood and Affect: Mood normal. Affect is tearful.        Behavior: Behavior normal.        Thought Content: Thought content normal.     Assessment and Plan: Inability to cope  Work stress  Anxiety with depression Assessment & Plan: Stable however not with real improvement.  Patient would like to hold off on increasing  Cymbalta at this time.  Stressed importance of establishing with a psychiatrist and a therapist and patient is on board this.  Due to being incapacitated due to uncontrollable anxiety, panic attacks, and a feeling of being overwhelmed which resulted in her not being able to stay focused on work and complete tasks we are extending her short-term disability from February 10 to April 20, 2023.  This will allow her time to get established with a psychiatrist and therapist and work on a treatment plan   Attention deficit disorder (ADD) without hyperactivity Assessment & Plan: Improving but not currently stable Increase Ritalin LA to 20 mg in a.m. and add on Ritalin 5 mg around 1 PM daily. PDMP reviewed and RX sent into pharmacy.      Follow Up Instructions: Return in about 3 weeks (around 04/21/2023) for f/u anxiety, f/u depression.   I discussed the assessment and treatment plan with the patient. The patient was provided an opportunity to ask questions and all were answered. The patient agreed with the plan and demonstrated an understanding of the instructions.   The patient was advised to call back or seek an in-person evaluation if the symptoms worsen or if the condition fails to improve as anticipated.  The above assessment and management plan was discussed with the patient. The patient verbalized understanding of and has agreed to the management plan. Patient is aware to call the clinic if symptoms persist or worsen. Patient is aware when to return to the clinic for a follow-up visit. Patient educated on when it is appropriate to go to the emergency department.     Mort Sawyers, MSN, APRN, FNP-C [facility]

## 2023-04-02 NOTE — Telephone Encounter (Signed)
Can you please send most recent office notes to [employer]?

## 2023-04-09 ENCOUNTER — Telehealth: Payer: Self-pay | Admitting: Family

## 2023-04-09 NOTE — Telephone Encounter (Signed)
 Paperwork completed in outbox Please call and let pt know that after this round of paperwork she will only be able to extend through psychiatry as at this time they will need to be the ones following due to the amount of time out

## 2023-04-09 NOTE — Telephone Encounter (Signed)
 Forms have been faxed to [facility]. Copies have been made for the chart. Original forms have been left up front for pt to pick up. Pt is aware of this information.

## 2023-04-10 ENCOUNTER — Ambulatory Visit (HOSPITAL_COMMUNITY): Payer: Medicaid Other | Admitting: Mental Health

## 2023-04-10 DIAGNOSIS — F419 Anxiety disorder, unspecified: Secondary | ICD-10-CM
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
## 2023-04-14 ENCOUNTER — Other Ambulatory Visit (HOSPITAL_COMMUNITY): Payer: Self-pay

## 2023-04-14 MED ORDER — METHYLPHENIDATE HCL 5 MG PO TABS
ORAL_TABLET | ORAL | 0 refills | Status: DC
  Filled 2023-04-14: qty 30, 30d supply, fill #0

## 2023-04-14 MED ORDER — METHYLPHENIDATE HCL ER (LA) 20 MG PO CP24
20.0000 mg | ORAL_CAPSULE | ORAL | 0 refills | Status: DC
  Filled 2023-04-14: qty 30, 30d supply, fill #0

## 2023-05-01 ENCOUNTER — Other Ambulatory Visit (HOSPITAL_COMMUNITY): Payer: Self-pay

## 2023-05-06 NOTE — Telephone Encounter (Signed)
 Did I finish this already or do we still need to update?

## 2023-05-07 ENCOUNTER — Ambulatory Visit (HOSPITAL_COMMUNITY): Payer: Medicaid Other | Admitting: Student

## 2023-05-07 NOTE — Telephone Encounter (Signed)
 Paperwork has been finished in outbox  Please let her know I have put the date of may 2nd on the form, as it wasn't maxed on the 3 months. Have her take this over with psychiatry once established.

## 2023-05-13 ENCOUNTER — Other Ambulatory Visit (HOSPITAL_COMMUNITY): Payer: Self-pay

## 2023-06-08 ENCOUNTER — Ambulatory Visit (HOSPITAL_COMMUNITY): Payer: Medicaid Other | Admitting: Mental Health

## 2023-06-16 ENCOUNTER — Ambulatory Visit (HOSPITAL_COMMUNITY): Payer: Medicaid Other | Admitting: Mental Health

## 2023-06-19 ENCOUNTER — Encounter (HOSPITAL_COMMUNITY): Payer: Self-pay

## 2023-06-23 ENCOUNTER — Ambulatory Visit (HOSPITAL_COMMUNITY): Admitting: Student

## 2023-07-30 ENCOUNTER — Encounter: Payer: Self-pay | Admitting: Family

## 2023-07-30 DIAGNOSIS — K5904 Chronic idiopathic constipation: Secondary | ICD-10-CM

## 2023-08-19 NOTE — Telephone Encounter (Signed)
 Gave pt contact info regarding referral, please see pt's question regarding letter

## 2023-08-24 ENCOUNTER — Encounter (HOSPITAL_COMMUNITY): Admitting: Psychiatry

## 2023-08-24 ENCOUNTER — Encounter (HOSPITAL_COMMUNITY): Payer: Self-pay

## 2023-08-24 NOTE — Progress Notes (Signed)
 Patient did not connect for virtual psychiatric medication management appointment on 08/24/23 at 3PM. Sent secure video link with no response. Left VM with callback number to reschedule.  LAURAINE DELENA PUMMEL, MD 08/24/23

## 2023-08-24 NOTE — Telephone Encounter (Signed)
 Can we please look at her old FMLA and confirm the dates, and symptoms are listed on the fMLA paperwork, thanks!

## 2023-08-31 NOTE — Telephone Encounter (Signed)
 Can pt please make video visit so that we can ensure the form is accurate with dates and symptoms that she is requesting the letterhead for?

## 2023-08-31 NOTE — Telephone Encounter (Signed)
 LVM to schedule appt so provider can complete form. Virtual ok per provider

## 2023-09-29 ENCOUNTER — Ambulatory Visit: Admitting: Family

## 2023-10-01 ENCOUNTER — Encounter: Payer: Self-pay | Admitting: Family

## 2023-10-17 IMAGING — DX DG CERVICAL SPINE COMPLETE 4+V
5 series · 5 of 5 positions shown · non-contrast
Comparison: Cervical spine radiographs 02/17/2018

CLINICAL DATA: Neck pain.

EXAM:
CERVICAL SPINE - COMPLETE 4+ VIEW

[cervical spine ap]
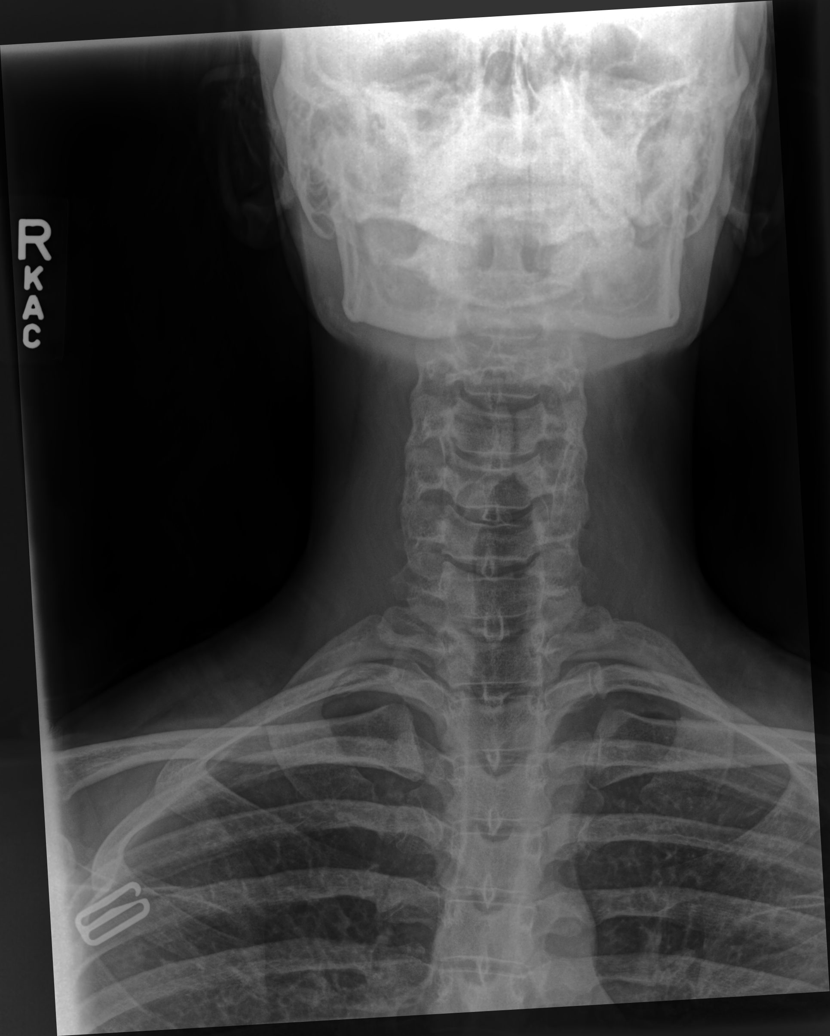

[cervical spine ap open mouth]
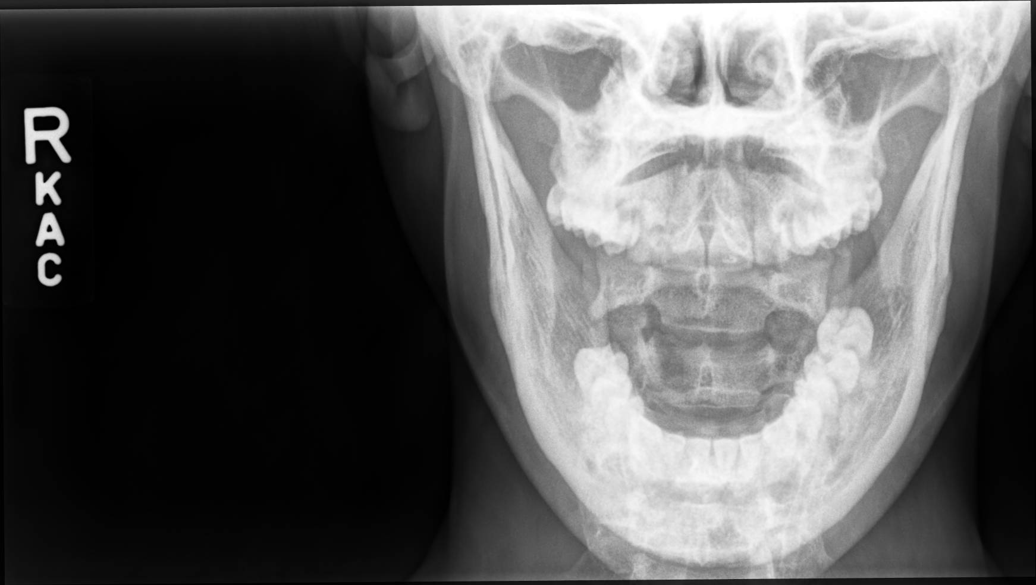

[cervical spine lat]
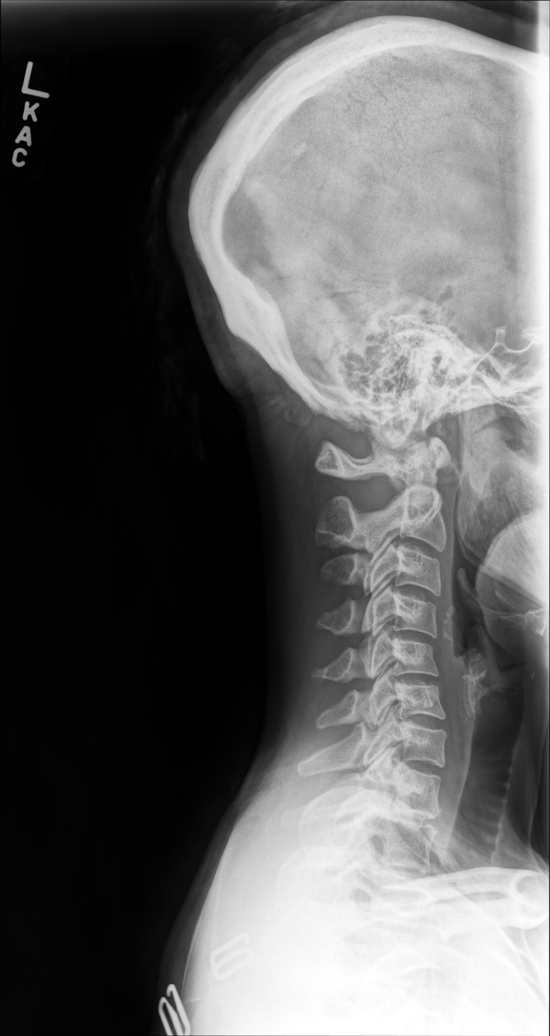

[cervical spine oblique lmo (1 of 2)]
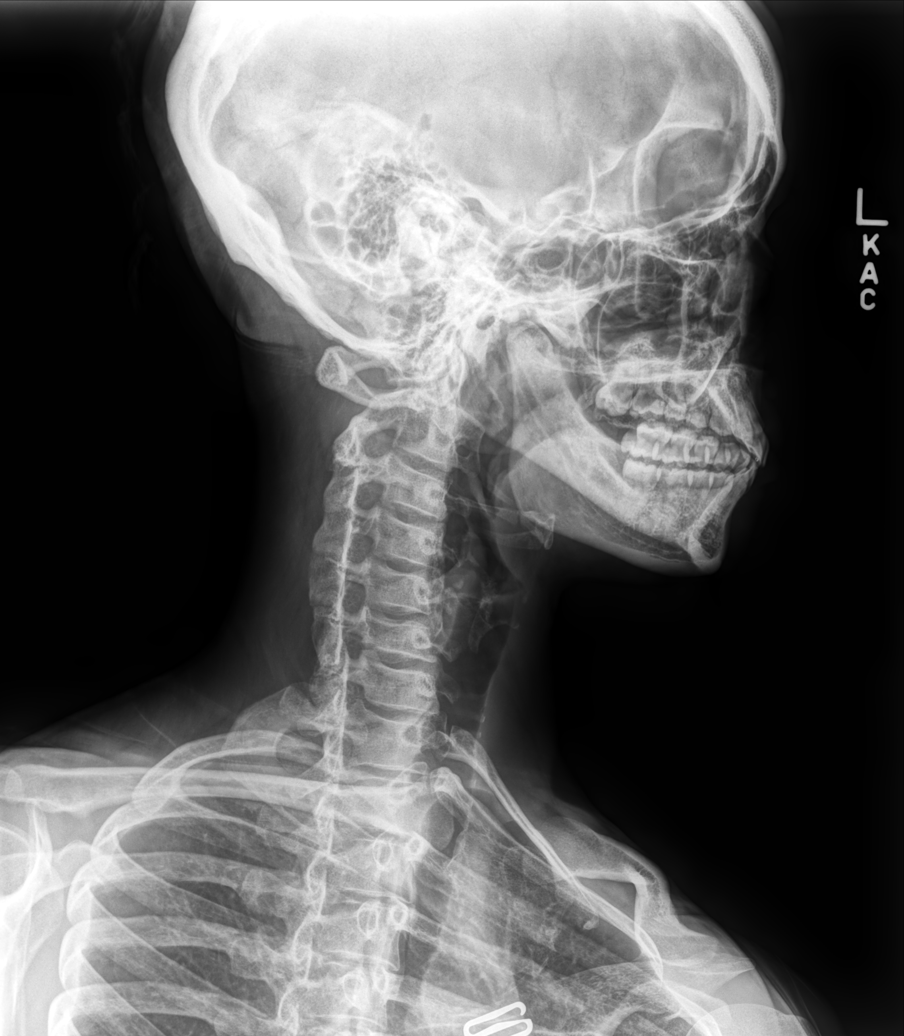

[cervical spine oblique lmo (2 of 2)]
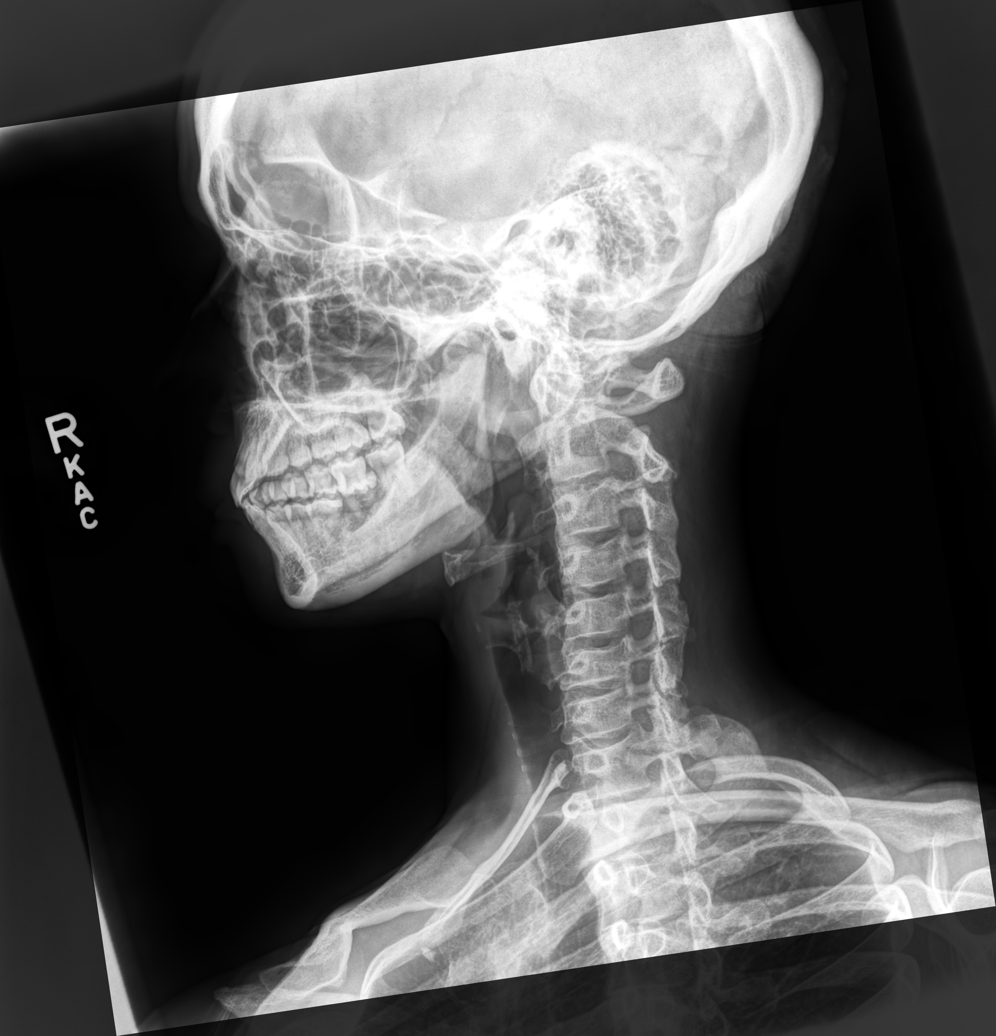

[5 of 5 positions shown; findings below may reference images not displayed]

FINDINGS: Normal frontal alignment. There is again straightening of the normal
cervical lordosis with minimal kyphotic angulation again seen at C5.
No sagittal spondylolisthesis. Vertebral body heights are
maintained.

The atlantodens interval is intact. Minimal posterior C6-7 disc
space narrowing is similar to prior.

No osseous neuroforaminal narrowing is seen on oblique views. No
prevertebral soft tissue swelling.
IMPRESSION: :
IMPRESSION: 1. No significant change in straightening of the normal cervical
lordosis. No sagittal spondylolisthesis.
2. Unchanged minimal posterior C6-7 disc space narrowing.

## 2023-11-10 ENCOUNTER — Ambulatory Visit: Admitting: Family

## 2023-11-11 ENCOUNTER — Encounter: Payer: Self-pay | Admitting: Family

## 2023-11-11 ENCOUNTER — Ambulatory Visit (INDEPENDENT_AMBULATORY_CARE_PROVIDER_SITE_OTHER): Admitting: Family

## 2023-11-11 VITALS — BP 102/86 | HR 74 | Ht 64.0 in | Wt 138.8 lb

## 2023-11-11 DIAGNOSIS — L409 Psoriasis, unspecified: Secondary | ICD-10-CM

## 2023-11-11 DIAGNOSIS — N920 Excessive and frequent menstruation with regular cycle: Secondary | ICD-10-CM | POA: Diagnosis not present

## 2023-11-11 DIAGNOSIS — M549 Dorsalgia, unspecified: Secondary | ICD-10-CM

## 2023-11-11 DIAGNOSIS — Z30011 Encounter for initial prescription of contraceptive pills: Secondary | ICD-10-CM | POA: Diagnosis not present

## 2023-11-11 DIAGNOSIS — N62 Hypertrophy of breast: Secondary | ICD-10-CM | POA: Insufficient documentation

## 2023-11-11 DIAGNOSIS — L700 Acne vulgaris: Secondary | ICD-10-CM | POA: Diagnosis not present

## 2023-11-11 DIAGNOSIS — F988 Other specified behavioral and emotional disorders with onset usually occurring in childhood and adolescence: Secondary | ICD-10-CM

## 2023-11-11 LAB — POCT URINE PREGNANCY: Preg Test, Ur: NEGATIVE

## 2023-11-11 MED ORDER — TRETINOIN 0.025 % EX CREA: TOPICAL_CREAM | Freq: Every day | CUTANEOUS | 0 refills | Status: AC

## 2023-11-11 MED ORDER — NORETHIN ACE-ETH ESTRAD-FE 1-20 MG-MCG PO TABS: ORAL_TABLET | ORAL | 3 refills | Status: AC

## 2023-11-11 MED ORDER — METHYLPHENIDATE HCL 10 MG PO TABS: 10.0000 mg | ORAL_TABLET | Freq: Every day | ORAL | 0 refills | Status: AC | PRN

## 2023-11-11 NOTE — Progress Notes (Signed)
 Established Patient Office Visit  Subjective:      CC:  Chief Complaint  Patient presents with   Medical Management of Chronic Issues    HPI: Alisha Edwards is a 33 y.o. female presenting on 11/11/2023 for Medical Management of Chronic Issues .  Discussed the use of AI scribe software for clinical note transcription with the patient, who gave verbal consent to proceed.  History of Present Illness Alisha Edwards is a 33 year old female who presents with acne and heavy menstrual bleeding.  She experiences acne with facial bumps that leave dark marks. She has not tried tretinoin or other treatments but is interested in exploring options. A previous dermatological treatment was ineffective, though she cannot recall the medication used.  She reports heavy menstrual bleeding since an abortion a year ago, describing it as 'super heavy' with bleeding through clothes and bed covers, affecting daily activities and social plans. She is interested in resuming birth control, having previously used Depo-Provera  for convenience. She experiences painful periods, especially in the initial days, and has a history of ovarian cysts noted in a 2018 transvaginal ultrasound.  She is currently using Ritalin  (methylphenidate ) 10 mg as needed for attention issues and is considering adjusting her medication to a lower dose or different release formulation.  No family history of breast cancer except for her grandmother, who was diagnosed at an older age. No personal history of blood clots, migraines, or smoking.         Social history:  Relevant past medical, surgical, family and social history reviewed and updated as indicated. Interim medical history since our last visit reviewed.  Allergies and medications reviewed and updated.  DATA REVIEWED: CHART IN EPIC     ROS: Negative unless specifically indicated above in HPI.    Current Outpatient Medications:    Cholecalciferol  1.25 MG  (50000 UT) TABS, Take 1 tablet by mouth once a week., Disp: 8 tablet, Rfl: 0   methylphenidate  (RITALIN ) 10 MG tablet, Take 1 tablet (10 mg total) by mouth daily as needed (focus)., Disp: 30 tablet, Rfl: 0   norethindrone -ethinyl estradiol -FE (JUNEL FE 1/20) 1-20 MG-MCG tablet, Take one tablet daily, skip placebo pills week and take continuously, Disp: 112 tablet, Rfl: 3   tretinoin (RETIN-A) 0.025 % cream, Apply topically at bedtime., Disp: 45 g, Rfl: 0   valACYclovir  (VALTREX ) 500 MG tablet, Take 1 tablet (500 mg total) by mouth daily., Disp: 90 tablet, Rfl: 0   DULoxetine  (CYMBALTA ) 30 MG capsule, Take 1 capsule (30 mg total) by mouth daily. (Patient not taking: Reported on 11/11/2023), Disp: 90 capsule, Rfl: 0   linaclotide  (LINZESS ) 290 MCG CAPS capsule, Take 1 capsule (290 mcg total) by mouth daily before breakfast. (Patient not taking: Reported on 11/11/2023), Disp: 90 capsule, Rfl: 0   propranolol  (INDERAL ) 10 MG tablet, Take one tablet 1 hour prior to anxiety provoking event prn (Patient not taking: Reported on 11/11/2023), Disp: 20 tablet, Rfl: 0        Objective:        BP 102/86 (BP Location: Left Arm, Patient Position: Sitting, Cuff Size: Normal)   Pulse 74   Ht 5' 4 (1.626 m)   Wt 138 lb 12.8 oz (63 kg)   SpO2 98%   BMI 23.82 kg/m   Physical Exam   Wt Readings from Last 3 Encounters:  11/11/23 138 lb 12.8 oz (63 kg)  02/04/23 132 lb (59.9 kg)  01/27/23 129 lb (58.5 kg)  Physical Exam Vitals reviewed.  Skin:    Comments: Cystic scarring scattered on face few  Raised skin pigmented lesions frontal upper forehead           Results RADIOLOGY Transvaginal ultrasound (2018): Ovary with thick-walled cysts and peripheral flow consistent with corpus luteum. Transvaginal ultrasound (2022): No evidence of ovarian mass.  Assessment & Plan:   Assessment and Plan Assessment & Plan Acne vulgaris Persistent facial acne with post-inflammatory hyperpigmentation.  Previous dermatology treatment was ineffective. No pustular lesions. Accutane requires dermatology prescription. Tretinoin discussed as an alternative. - Prescribe tretinoin cream to be applied once daily before bedtime. Advise cleansing with a mild nonmedicated cleanser before application and avoiding contact with eyes, mouth, and mucous membranes. Caution with photosensitivity - Inform that improvements may be noticed after two weeks, with sustained benefit potentially requiring more than seven weeks. - Refer to dermatology for further evaluation and management.  Heavy menstrual bleeding and dysmenorrhea Reports menorrhagia lasting about seven days with significant dysmenorrhea. Symptoms have persisted since an abortion a year ago. No family history of breast cancer or personal history of thromboembolic events. Previous ultrasound in 2018 showed ovarian cysts, but no ovarian mass in 2022. Tranexamic acid  discussed but not preferred due to hemophilia use and preference for hormonal management. - Start Junel birth control continuously to manage menorrhagia and dysmenorrhea. -hcg preg negative in office  - Advise taking ibuprofen  or Pamprin for symptom relief. - Instruct to track menstrual cycle for better management.  Contraceptive management Desires birth control for contraception, acne, and menorrhagia management. Prefers Depo over daily pills but agrees to Junel. No contraindications for hormonal contraceptives. Discussed potential side effects, including low risk of thromboembolic events, especially given lack of risk factors such as smoking or migraines. - Start Junel birth control continuously, skipping placebo week to manage menstrual cycle and acne. - Discuss potential side effects, including low risk of thromboembolic events, and emphasize importance of consistent timing. -consider transvaginal u/s if symptoms persist  Attention deficit disorder (ADD) without hyperactivity Currently on  methylphenidate  10 mg extended release. Prefers lower dose and immediate release formulation due to current needs. - Switch to methylphenidate  10 mg immediate release once daily as needed. - Advise to request a refill when needed and inform that a video or in-person visit is required if not seen in three months.  Ovarian cysts, history of Ovarian cysts noted in 2018 ultrasound, with no evidence of ovarian mass in 2022. Current symptoms include menorrhagia and dysmenorrhea, potentially related to cysts. - Monitor symptoms with the initiation of continuous birth control to assess for improvement. - Consider further evaluation with ultrasound if symptoms persist or worsen.        Return in about 4 years (around 11/11/2027) for f/u CPE.     Ginger Patrick, MSN, APRN, FNP-C [facility]

## 2023-12-17 ENCOUNTER — Ambulatory Visit: Admitting: Family

## 2023-12-23 ENCOUNTER — Ambulatory Visit (INDEPENDENT_AMBULATORY_CARE_PROVIDER_SITE_OTHER): Admitting: Family

## 2023-12-23 ENCOUNTER — Encounter: Payer: Self-pay | Admitting: Family

## 2023-12-23 VITALS — BP 122/88 | HR 79 | Temp 98.4°F | Wt 140.2 lb

## 2023-12-23 DIAGNOSIS — F39 Unspecified mood [affective] disorder: Secondary | ICD-10-CM

## 2023-12-23 DIAGNOSIS — F418 Other specified anxiety disorders: Secondary | ICD-10-CM | POA: Diagnosis not present

## 2023-12-23 DIAGNOSIS — F988 Other specified behavioral and emotional disorders with onset usually occurring in childhood and adolescence: Secondary | ICD-10-CM

## 2023-12-23 DIAGNOSIS — F519 Sleep disorder not due to a substance or known physiological condition, unspecified: Secondary | ICD-10-CM | POA: Diagnosis not present

## 2023-12-23 DIAGNOSIS — F4329 Adjustment disorder with other symptoms: Secondary | ICD-10-CM

## 2023-12-23 MED ORDER — VENLAFAXINE HCL ER 37.5 MG PO CP24: 37.5000 mg | ORAL_CAPSULE | Freq: Every day | ORAL | 1 refills | Status: AC

## 2023-12-23 NOTE — Patient Instructions (Addendum)
  Please work on the following to help with sleep:  -Sleep only long enough to feel rested then get out of bed -Go to bed and get up at the same time every day. -Do not try to force yourself to sleep. If you can't sleep, get out of bed adn try again later. -Have coffee, tea, and other foods that have caffeine only in the morning. -Avoid alcohol -Keep your bedroom dark, cool, quiet, and free of reminders of work or other things that cause you stress -Exercise several days a week, but not right before bed -Avoid looking at phones or reading devices (e-books) that give off light before bed. This can make it harder to fall asleep   ------------------------------------  Here are some medicaid recommendations, if these do not work out I suggest that you call your insurance and see available options and we can also place that referral appropriately with whatever you may find.  [facility] Phone # [phone] They have locations in [place], [place], [place], and [place]. They do take [employer] I know for sure and the [employer].   [facility] [phone]  [address] [place], [place], [place] [address]    [facility] [employer] [address] [place], [place] [address]   [phone]   [facility]- [phone]   [place] [phone]   [place]- [phone]   If at any time you feel your needs are more urgent or you are concerned for your well being please see the below options:  For walk in options for mental health:   [facility] [address] in [place], [place]. Call our 24-hour HelpLine at [phone] or [phone] for immediate assistance for mental health and substance abuse issues.  And or walk into [facility] hospital ER.   [employer] [facility]: 1-800-SUICIDE  [employer] Suicide Prevention Lifeline: 1-800-273-TALK  Regards,   Ginger Patrick FNP-C

## 2023-12-23 NOTE — Progress Notes (Signed)
 Established Patient Office Visit  Subjective:      CC:  Chief Complaint  Patient presents with   Medical Management of Chronic Issues    HPI: Alisha Edwards is a 33 y.o. female presenting on 12/23/2023 for Medical Management of Chronic Issues .  Discussed the use of AI scribe software for clinical note transcription with the patient, who gave verbal consent to proceed.  History of Present Illness  Pt here with concerns as her anxiety is starting to worsen again. 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 Her duties include customer interactions via the phone, and often she finds that she becomes easily overwhelmed and anxious during these calls. She gets to a point where she doesn't know if she can handle the work load because of her anxiety. She states she tries her best to redirect and calm her nerves, but it is very challenging.   She states last week Monday 11/3 she told her supervisor that she would need to take a break from work and she has not been back to work since, she is requesting FMLA.   In feb 2025 she had a similar struggle with her other job prior to this for similar symptoms. She then felt overwhelmed as well with another call center position. We had discussed that she establish with psychiatry and a therapist but she did not make the appointments.   In a prior note 03/31/23 she told me this is more than just 'lack of focus with her ADD' and depression/anxiety, she finds she avoids anything involving her job, and she is concerned over this. She does not sleep well at all as her mind just races when she has to work. Since being out of work she 'sleeps like a baby of which she again confirms that she does much better without working.   She has had FMLA prior 10/28 and RTW 1/6, but when she went back she was not able to handle the work  load with her anxiety and and so she again left work and started [employer] with anticipated RTW April 20, 2023 but instead of returning she found another job.      Social history:  Relevant past medical, surgical, family and social history reviewed and updated as indicated. Interim medical history since our last visit reviewed.  Allergies and medications reviewed and updated.  DATA REVIEWED: CHART IN EPIC     ROS: Negative unless specifically indicated above in HPI.    Current Outpatient Medications:    Cholecalciferol  1.25 MG (50000 UT) TABS, Take 1 tablet by mouth once a week., Disp: 8 tablet, Rfl: 0   methylphenidate  (RITALIN ) 10 MG tablet, Take 1 tablet (10 mg total) by mouth daily as needed (focus)., Disp: 30 tablet, Rfl: 0   norethindrone -ethinyl estradiol -FE (JUNEL FE 1/20) 1-20 MG-MCG tablet, Take one tablet daily, skip placebo pills week and take continuously, Disp: 112 tablet, Rfl: 3   tretinoin  (RETIN-A ) 0.025 % cream, Apply topically at bedtime., Disp: 45 g, Rfl: 0   valACYclovir  (VALTREX ) 500 MG tablet, Take 1 tablet (500 mg total) by mouth daily., Disp: 90 tablet, Rfl: 0   venlafaxine  XR (EFFEXOR  XR) 37.5 MG 24 hr capsule, Take 1 capsule (37.5 mg total) by mouth daily with breakfast., Disp: 30 capsule, Rfl: 1        Objective:  BP 122/88 (BP Location: Right Arm, Patient Position: Sitting, Cuff Size: Normal)   Pulse 79   Temp 98.4 F (36.9 C) (Temporal)   Wt 140 lb 3.2 oz (63.6 kg)   SpO2 98%   BMI 24.07 kg/m   Physical Exam   Wt Readings from Last 3 Encounters:  12/23/23 140 lb 3.2 oz (63.6 kg)  11/11/23 138 lb 12.8 oz (63 kg)  02/04/23 132 lb (59.9 kg)    Physical Exam Vitals reviewed.  Constitutional:      General: She is not in acute distress.    Appearance: Normal appearance. She is normal weight. She is not ill-appearing, toxic-appearing or diaphoretic.  HENT:     Head: Normocephalic.  Cardiovascular:     Rate and Rhythm: Normal rate.   Pulmonary:     Effort: Pulmonary effort is normal.  Musculoskeletal:        General: Normal range of motion.  Neurological:     General: No focal deficit present.     Mental Status: She is alert and oriented to person, place, and time. Mental status is at baseline.  Psychiatric:        Mood and Affect: Mood normal.        Behavior: Behavior normal.        Thought Content: Thought content normal.        Judgment: Judgment normal.          Results   Assessment & Plan:   Assessment and Plan Assessment & Plan   Mood disorder -     Ambulatory referral to Psychiatry -     Ambulatory referral to Psychology  Attention deficit disorder (ADD) without hyperactivity -     Ambulatory referral to Psychiatry -     Ambulatory referral to Psychology  Anxiety with depression -     Ambulatory referral to Psychiatry -     Ambulatory referral to Psychology -     Venlafaxine  HCl ER; Take 1 capsule (37.5 mg total) by mouth daily with breakfast.  Dispense: 30 capsule; Refill: 1  Psychophysiologic sleep disorder  Adjustment disorder with work problems Assessment & Plan: Recurrent issue.  Advised pt we will start FMLA from 11/3 to anticipated RTW 12/1. She had request 2-3 months but as this is recurrent and needs evaluation with psychiatry/therapy I am only going to provide FMLA for these dates of which she will be required to become established with a psychiatrist in that time frame of which they will evaluate and treat if necessary to come up with ways to accommodate for anxiety with work. Advised her it will then be at the discretion of the psychiatrist if FMLA is to be continued.   Starting effexor  37.5 mg once daily today.        Return in about 3 weeks (around 01/13/2024) for f/u anxiety.     Ginger Patrick, MSN, APRN, FNP-C  [facility]

## 2023-12-28 ENCOUNTER — Encounter: Payer: Self-pay | Admitting: Family

## 2023-12-28 DIAGNOSIS — F4329 Adjustment disorder with other symptoms: Secondary | ICD-10-CM | POA: Insufficient documentation

## 2023-12-28 DIAGNOSIS — F519 Sleep disorder not due to a substance or known physiological condition, unspecified: Secondary | ICD-10-CM | POA: Insufficient documentation

## 2023-12-28 NOTE — Assessment & Plan Note (Signed)
 Recurrent issue.  Advised pt we will start FMLA from 11/3 to anticipated RTW 12/1. She had request 2-3 months but as this is recurrent and needs evaluation with psychiatry/therapy I am only going to provide FMLA for these dates of which she will be required to become established with a psychiatrist in that time frame of which they will evaluate and treat if necessary to come up with ways to accommodate for anxiety with work. Advised her it will then be at the discretion of the psychiatrist if FMLA is to be continued.   Starting effexor 37.5 mg once daily today.

## 2023-12-31 NOTE — Telephone Encounter (Signed)
 Fininshed and in outbox

## 2024-01-04 NOTE — Telephone Encounter (Signed)
 Can you print her recent FMLA We are extending her FMLA to December 15th.

## 2024-01-12 ENCOUNTER — Institutional Professional Consult (permissible substitution): Admitting: Plastic Surgery

## 2024-01-21 ENCOUNTER — Telehealth: Payer: Self-pay | Admitting: Family

## 2024-01-21 NOTE — Telephone Encounter (Signed)
 Attempted to contact [employer] at the number provided. When calling it's asking for all of the pt's information she is the one calling. Tried to get an actual person on the line and I was unsuccessful.

## 2024-01-21 NOTE — Telephone Encounter (Signed)
 Copied from CRM #[id number]. Topic: General - Other >> Jan 21, 2024  1:45 PM Thersia BROCKS wrote: Reason for CRM: [employer] calling wanting to verification regaridng patient if she was seen at office for depression would like a callback    [phone]

## 2024-01-26 ENCOUNTER — Encounter: Payer: Self-pay | Admitting: Family

## 2024-01-26 NOTE — Telephone Encounter (Signed)
 Received Short term disability forms for provider to complete.  Will fax to [employer] at [phone] when completed  Will reach out to patient once completed  Placed in providers box for review

## 2024-01-29 DIAGNOSIS — Z0279 Encounter for issue of other medical certificate: Secondary | ICD-10-CM

## 2024-01-29 NOTE — Telephone Encounter (Signed)
Completed paperwork in outbox 

## 2024-01-29 NOTE — Telephone Encounter (Signed)
 Completed forms received and faxed to [employer] at [phone]  Copy sent to scan  MyChart message left for patient letting her know a copy is at the front desk to pick up at her convenience.

## 2024-02-01 ENCOUNTER — Encounter

## 2024-02-02 ENCOUNTER — Ambulatory Visit

## 2024-02-03 ENCOUNTER — Telehealth

## 2024-02-03 ENCOUNTER — Ambulatory Visit: Payer: Self-pay

## 2024-02-03 NOTE — Telephone Encounter (Signed)
 NOTED

## 2024-02-03 NOTE — Telephone Encounter (Signed)
 FYI Only or Action Required?: FYI only for provider: appointment scheduled on 12.24.25 [facility].  Patient was last seen in primary care on 12/23/2023 by Corwin Antu, FNP.  Called Nurse Triage reporting Sore Throat.  Symptoms began several days ago.  Interventions attempted: Rest, hydration, or home remedies.  Symptoms are: gradually worsening.  Triage Disposition: See Physician Within 24 Hours  Patient/caregiver understands and will follow disposition?: Yes   Copied from CRM #[id number]. Topic: Clinical - Red Word Triage >> Feb 03, 2024 10:34 AM Terri MATSU wrote: Red Word that prompted transfer to Nurse Triage: Patient experiencing pain and white patches in throat since Monday. Reason for Disposition  SEVERE throat pain (e.g., excruciating)  Answer Assessment - Initial Assessment Questions Sunday sore throat started. Monday went to UC as she had white patches on tonsils, strep was negative. They did additional testing and stated that she had a vaginal yeast infection. She states when they called her with the results. She tried to explain to them that her throat is still very painful. She states the white patches on her tonsils only. Denies any on her tongue or cheeks. Denies any higher acuity symptoms.    1. ONSET: When did the throat start hurting? (Hours or days ago)      Monday 2. SEVERITY: How bad is the sore throat? (Scale 1-10; mild, moderate or severe)     8 3. FEVER: Do you have a fever? If Yes, ask: What is your temperature, how was it measured, and when did it start?     denies 4. PUS ON THE TONSILS: Is there pus on the tonsils in the back of your throat?     yes 5. OTHER SYMPTOMS: Do you have any other symptoms? (e.g., difficulty breathing, headache, rash)     denies  Protocols used: Sore Throat-A-AH

## 2024-02-04 ENCOUNTER — Telehealth

## 2024-03-01 ENCOUNTER — Telehealth: Admitting: Physician Assistant

## 2024-03-01 DIAGNOSIS — B3731 Acute candidiasis of vulva and vagina: Secondary | ICD-10-CM | POA: Diagnosis not present

## 2024-03-01 MED ORDER — FLUCONAZOLE 150 MG PO TABS: 150.0000 mg | ORAL_TABLET | ORAL | 0 refills | Status: AC | PRN

## 2024-03-01 NOTE — Progress Notes (Signed)

## 2024-03-18 ENCOUNTER — Encounter: Payer: Self-pay | Admitting: Family
